# Patient Record
Sex: Female | Born: 1975
Health system: Southern US, Community
[De-identification: ages and names within clinical notes are randomized; demographics above are authoritative.]

## PROBLEM LIST (undated history)

## (undated) DIAGNOSIS — R0600 Dyspnea, unspecified: Secondary | ICD-10-CM

## (undated) DIAGNOSIS — J439 Emphysema, unspecified: Secondary | ICD-10-CM

## (undated) DIAGNOSIS — E039 Hypothyroidism, unspecified: Secondary | ICD-10-CM

## (undated) DIAGNOSIS — M419 Scoliosis, unspecified: Secondary | ICD-10-CM

## (undated) DIAGNOSIS — Z148 Genetic carrier of other disease: Secondary | ICD-10-CM

## (undated) HISTORY — DX: Hypothyroidism, unspecified: E03.9

## (undated) HISTORY — PX: BACK SURGERY: SHX140

## (undated) HISTORY — DX: Emphysema, unspecified: J43.9

---

## 2015-01-07 ENCOUNTER — Encounter (HOSPITAL_COMMUNITY): Payer: Self-pay

## 2015-01-07 ENCOUNTER — Emergency Department (HOSPITAL_COMMUNITY)
Admission: EM | Admit: 2015-01-07 | Discharge: 2015-01-07 | Disposition: A | Discharge status: Home / Self Care | Payer: BLUE CROSS/BLUE SHIELD | Attending: Emergency Medicine | Admitting: Emergency Medicine

## 2015-01-07 ENCOUNTER — Emergency Department (HOSPITAL_COMMUNITY): Payer: BLUE CROSS/BLUE SHIELD

## 2015-01-07 DIAGNOSIS — S322XXA Fracture of coccyx, initial encounter for closed fracture: Secondary | ICD-10-CM | POA: Diagnosis not present

## 2015-01-07 DIAGNOSIS — W06XXXA Fall from bed, initial encounter: Secondary | ICD-10-CM | POA: Diagnosis not present

## 2015-01-07 DIAGNOSIS — Y998 Other external cause status: Secondary | ICD-10-CM | POA: Diagnosis not present

## 2015-01-07 DIAGNOSIS — Y9289 Other specified places as the place of occurrence of the external cause: Secondary | ICD-10-CM | POA: Insufficient documentation

## 2015-01-07 DIAGNOSIS — Z720 Tobacco use: Secondary | ICD-10-CM | POA: Diagnosis not present

## 2015-01-07 DIAGNOSIS — S3210XA Unspecified fracture of sacrum, initial encounter for closed fracture: Secondary | ICD-10-CM

## 2015-01-07 DIAGNOSIS — Z8739 Personal history of other diseases of the musculoskeletal system and connective tissue: Secondary | ICD-10-CM | POA: Insufficient documentation

## 2015-01-07 DIAGNOSIS — S3219XA Other fracture of sacrum, initial encounter for closed fracture: Secondary | ICD-10-CM | POA: Insufficient documentation

## 2015-01-07 DIAGNOSIS — Y9384 Activity, sleeping: Secondary | ICD-10-CM | POA: Insufficient documentation

## 2015-01-07 DIAGNOSIS — S3992XA Unspecified injury of lower back, initial encounter: Secondary | ICD-10-CM | POA: Diagnosis present

## 2015-01-07 HISTORY — DX: Scoliosis, unspecified: M41.9

## 2015-01-07 MED ORDER — METHOCARBAMOL 500 MG PO TABS: 500.0000 mg | ORAL_TABLET | Freq: Two times a day (BID) | ORAL | Status: DC

## 2015-01-07 MED ORDER — OXYCODONE-ACETAMINOPHEN 5-325 MG PO TABS
1.0000 | ORAL_TABLET | Freq: Once | ORAL | Status: AC
  Administered 2015-01-07: 1 via ORAL
  Filled 2015-01-07: qty 1

## 2015-01-07 MED ORDER — OXYCODONE-ACETAMINOPHEN 5-325 MG PO TABS: 1.0000 | ORAL_TABLET | Freq: Four times a day (QID) | ORAL | Status: DC | PRN

## 2015-01-07 MED ORDER — NAPROXEN 500 MG PO TABS: 500.0000 mg | ORAL_TABLET | Freq: Two times a day (BID) | ORAL | Status: DC

## 2015-01-07 NOTE — ED Notes (Signed)
PA at the bedside.

## 2015-01-07 NOTE — ED Notes (Signed)
Pt. Reports slipping on tile floor 3-4 days ago and landed on buttocks. Reports pain to tailbone. CNS intact distally.

## 2015-01-07 NOTE — ED Notes (Signed)
Patient in Xray at this time.

## 2015-01-07 NOTE — ED Provider Notes (Signed)
CSN: [id number]     Arrival date & time 01/07/15  1616 History  This chart was scribed for Noland Fordyce, PA-C, working with [employer]. Alvino Chapel, MD by Steva Colder, ED Scribe. The patient was seen in room TR04C/TR04C at 6:28 PM.    Chief Complaint  Patient presents with  . Fall  . Tailbone Pain      The history is provided by the patient. No language interpreter was used.    HPI Comments: Elaine Gibson is a 39 y.o. female with a medical hx of scoliosis who presents to the Emergency Department complaining of fall onset 3-4 days ago. Pt notes that she slipped on the floor because she fell out of the bed while sleeping and landed on her bottom.  She states that she is having associated symptoms of tailbone pain. She states that she has tried tylenol with no relief for her symptoms. She denies bruising numbness/tingling, bowel/bladder issues, and any other symptoms. Pt has had back surgery for scoliosis. Denies having an orthopedist. Pt is a Scientist, water quality.    Past Medical History  Diagnosis Date  . Scoliosis    Past Surgical History  Procedure Laterality Date  . Cesarean section     No family history on file. History  Substance Use Topics  . Smoking status: Current Every Day Smoker    Types: Cigarettes  . Smokeless tobacco: Not on file  . Alcohol Use: No   OB History    No data available     Review of Systems  Genitourinary: Negative for dysuria, urgency and hematuria.  Musculoskeletal: Positive for arthralgias. Negative for myalgias.  Skin: Negative for color change and wound.  Neurological: Negative for numbness.      Allergies  Review of patient's allergies indicates no known allergies.  Home Medications   Prior to Admission medications   Medication Sig Start Date End Date Taking? Authorizing Provider  methocarbamol (ROBAXIN) 500 MG tablet Take 1 tablet (500 mg total) by mouth 2 (two) times daily. 01/07/15   Noland Fordyce, PA-C  naproxen (NAPROSYN) 500 MG tablet Take 1  tablet (500 mg total) by mouth 2 (two) times daily. 01/07/15   Noland Fordyce, PA-C  oxyCODONE-acetaminophen (PERCOCET/ROXICET) 5-325 MG per tablet Take 1-2 tablets by mouth every 6 (six) hours as needed for severe pain. 01/07/15   Noland Fordyce, PA-C   BP 110/79 mmHg  Pulse 100  Temp(Src) 98.7 F (37.1 C)  Resp 16  SpO2 99%  LMP 01/05/2015  Physical Exam  Constitutional: She is oriented to person, place, and time. She appears well-developed and well-nourished.  Sitting uncomfortably in exam chair leaning towards right side.  HENT:  Head: Normocephalic and atraumatic.  Eyes: EOM are normal.  Neck: Normal range of motion.  Cardiovascular: Normal rate.   Pulmonary/Chest: Effort normal.  Musculoskeletal: Normal range of motion.       Lumbar back: She exhibits no tenderness.  Tender along the sacrum and middle of buttocks.  No midline lumbar tenderness.   Neurological: She is alert and oriented to person, place, and time. No sensory deficit.  Antalgic gait. Sensation to large extremities intact.   Skin: Skin is warm and dry.  Psychiatric: She has a normal mood and affect. Her behavior is normal.  Nursing note and vitals reviewed.   ED Course  Procedures (including critical care time) DIAGNOSTIC STUDIES: Oxygen Saturation is 99% on room air, normal by my interpretation.    COORDINATION OF CARE: 6:31 PM-Discussed treatment plan which includes percocet, X-ray  of sacrum/coccyx, f/u and referral to orthopedist in 1-2 weeks, f/u if the symptoms worsen with pt at bedside and pt agreed to plan.   Labs Review Labs Reviewed - No data to display  Imaging Review Dg Sacrum/coccyx  01/07/2015   CLINICAL DATA:  Status post fall 2 days ago. Sacral pain. Unable to sit flat.  EXAM: SACRUM AND COCCYX - 2+ VIEW  COMPARISON:  None.  FINDINGS: There is a transverse nondisplaced mildly comminuted fracture of the S5 vertebral body segment. There is no other fracture or subluxation.  IMPRESSION: Transverse  nondisplaced mildly comminuted fracture of the S5 vertebral body segment.   Electronically Signed   By: Kathreen Devoid   On: 01/07/2015 17:01     EKG Interpretation None      MDM   Final diagnoses:  Fall from bed, initial encounter  Sacrum and coccyx fracture, closed, initial encounter    Pt presenting to ED with buttock pain. Plain films significant for transverse nondisplaced mildly comminuted fracture of the S5 vertebral body segment. Reviewed imaging with Dr. Alvino Chapel, advised pt f/u with orthopedics, tx symptomatically for pain.  No emergent intervention indicated at this time.   I personally performed the services described in this documentation, which was scribed in my presence. The recorded information has been reviewed and is accurate.    Noland Fordyce, PA-C 01/07/15 [place] Alvino Chapel, MD 01/08/15 [phone]

## 2015-01-07 NOTE — ED Notes (Signed)
Patient placed in room. Able to ambulate.

## 2016-11-04 ENCOUNTER — Emergency Department (HOSPITAL_COMMUNITY)
Admission: EM | Admit: 2016-11-04 | Discharge: 2016-11-04 | Disposition: A | Discharge status: Home / Self Care | Payer: Managed Care, Other (non HMO) | Attending: Emergency Medicine | Admitting: Emergency Medicine

## 2016-11-04 DIAGNOSIS — F17210 Nicotine dependence, cigarettes, uncomplicated: Secondary | ICD-10-CM | POA: Diagnosis not present

## 2016-11-04 DIAGNOSIS — K047 Periapical abscess without sinus: Secondary | ICD-10-CM

## 2016-11-04 DIAGNOSIS — Z79899 Other long term (current) drug therapy: Secondary | ICD-10-CM | POA: Diagnosis not present

## 2016-11-04 DIAGNOSIS — K0889 Other specified disorders of teeth and supporting structures: Secondary | ICD-10-CM | POA: Diagnosis present

## 2016-11-04 DIAGNOSIS — K029 Dental caries, unspecified: Secondary | ICD-10-CM | POA: Diagnosis not present

## 2016-11-04 MED ORDER — HYDROCODONE-ACETAMINOPHEN 5-325 MG PO TABS
1.0000 | ORAL_TABLET | Freq: Once | ORAL | Status: AC
  Administered 2016-11-04: 1 via ORAL
  Filled 2016-11-04: qty 1

## 2016-11-04 MED ORDER — AMOXICILLIN 500 MG PO CAPS: 500.0000 mg | ORAL_CAPSULE | Freq: Three times a day (TID) | ORAL | 0 refills | Status: DC

## 2016-11-04 MED ORDER — AMOXICILLIN 500 MG PO CAPS
500.0000 mg | ORAL_CAPSULE | Freq: Once | ORAL | Status: AC
  Administered 2016-11-04: 500 mg via ORAL
  Filled 2016-11-04: qty 1

## 2016-11-04 MED ORDER — TRAMADOL HCL 50 MG PO TABS: 50.0000 mg | ORAL_TABLET | Freq: Four times a day (QID) | ORAL | 0 refills | Status: DC | PRN

## 2016-11-04 NOTE — ED Provider Notes (Signed)
[facility] DEPT Provider Note   CSN: [id number] Arrival date & time: 11/04/16  2015  By signing my name below, I, Jeanell Sparrow, attest that this documentation has been prepared under the direction and in the presence of non-physician practitioner, Debroah Baller, NP. Electronically Signed: Jeanell Sparrow, Scribe. 11/04/2016. 10:52 PM.  History   Chief Complaint Chief Complaint  Patient presents with  . Dental Pain   The history is provided by the patient. No language interpreter was used.  Dental Pain   This is a new problem. The current episode started yesterday. The problem occurs constantly. The problem has been gradually worsening. The pain is moderate. She has tried acetaminophen for the symptoms. The treatment provided no relief.   HPI Comments: Elaine Gibson is a 40 y.o. female who presents to the Emergency Department complaining of constant moderate left-sided dental pain that started yesterday. She describes the pain as unrelieved by aleve and exacerbated by eating. She reports associated left-sided headache. She denies any prior hx of similar complaint, current dentist, nausea, vomiting, chills, or other complaints.   Past Medical History:  Diagnosis Date  . Scoliosis     There are no active problems to display for this patient.   Past Surgical History:  Procedure Laterality Date  . CESAREAN SECTION      OB History    No data available       Home Medications    Prior to Admission medications   Medication Sig Start Date End Date Taking? Authorizing Provider  amoxicillin (AMOXIL) 500 MG capsule Take 1 capsule (500 mg total) by mouth 3 (three) times daily. 11/04/16   Hope Bunnie Pion, NP  methocarbamol (ROBAXIN) 500 MG tablet Take 1 tablet (500 mg total) by mouth 2 (two) times daily. 01/07/15   Noland Fordyce, PA-C  naproxen (NAPROSYN) 500 MG tablet Take 1 tablet (500 mg total) by mouth 2 (two) times daily. 01/07/15   Noland Fordyce, PA-C  oxyCODONE-acetaminophen  (PERCOCET/ROXICET) 5-325 MG per tablet Take 1-2 tablets by mouth every 6 (six) hours as needed for severe pain. 01/07/15   Noland Fordyce, PA-C  traMADol (ULTRAM) 50 MG tablet Take 1 tablet (50 mg total) by mouth every 6 (six) hours as needed. 11/04/16   Hope Bunnie Pion, NP    Family History No family history on file.  Social History Social History  Substance Use Topics  . Smoking status: Current Every Day Smoker    Types: Cigarettes  . Smokeless tobacco: Not on file  . Alcohol use No     Allergies   Patient has no known allergies.   Review of Systems Review of Systems  Constitutional: Negative for chills and fever.  HENT: Positive for dental problem and ear pain (left). Negative for ear discharge and sore throat.   Respiratory: Negative for cough.   Gastrointestinal: Negative for nausea and vomiting.  Skin: Negative for wound.  Neurological: Positive for headaches (Left-sided).  Psychiatric/Behavioral: Negative for confusion.     Physical Exam Updated Vital Signs BP 93/65   Pulse 90   Temp 98 F (36.7 C)   Resp 14   LMP 09/18/2016 (Approximate)   SpO2 97%   Physical Exam  Constitutional: She appears well-developed and well-nourished. No distress.  HENT:  Head: Normocephalic.  Mouth/Throat: Oropharynx is clear and moist. Abnormal dentition. Dental caries present.  Lower left first molar is broken. Surrounding gum is TTP.   Eyes: Conjunctivae are normal.  Neck: Neck supple.  Cardiovascular: Normal rate and regular rhythm.  Pulmonary/Chest: Effort normal. No respiratory distress. She has no wheezes. She has no rales.  Abdominal: Soft. There is no tenderness.  Musculoskeletal: Normal range of motion.  Lymphadenopathy:    She has cervical adenopathy (Left-sided).  Neurological: She is alert.  Skin: Skin is warm and dry.  Psychiatric: She has a normal mood and affect. Her behavior is normal.  Nursing note and vitals reviewed.    ED Treatments / Results    DIAGNOSTIC STUDIES: Oxygen Saturation is 97% on RA, normal by my interpretation.    COORDINATION OF CARE: 10:55 PM- Pt advised of plan for treatment and pt agrees.  Labs (all labs ordered are listed, but only abnormal results are displayed) Labs Reviewed - No data to display   Radiology No results found.  Procedures Procedures (including critical care time)  Medications Ordered in ED Medications  HYDROcodone-acetaminophen (NORCO/VICODIN) 5-325 MG per tablet 1 tablet (1 tablet Oral Given 11/04/16 2322)  amoxicillin (AMOXIL) capsule 500 mg (500 mg Oral Given 11/04/16 2322)     Initial Impression / Assessment and Plan / ED Course  I have reviewed the triage vital signs and the nursing notes.   Clinical Course   Patient with dentalgia.  No abscess requiring immediate incision and drainage.  Exam not concerning for Ludwig's angina or pharyngeal abscess.  Will treat with Amoxicillin and Tramadol. Pt instructed to follow-up with dentist. Referral given. Discussed return precautions. Pt appears safe for discharge.   Final Clinical Impressions(s) / ED Diagnoses   Final diagnoses:  Infected dental caries    New Prescriptions Discharge Medication List as of 11/04/2016 11:01 PM    START taking these medications   Details  amoxicillin (AMOXIL) 500 MG capsule Take 1 capsule (500 mg total) by mouth 3 (three) times daily., Starting Fri 11/04/2016, Print    traMADol (ULTRAM) 50 MG tablet Take 1 tablet (50 mg total) by mouth every 6 (six) hours as needed., Starting Fri 11/04/2016, Print       I personally performed the services described in this documentation, which was scribed in my presence. The recorded information has been reviewed and is accurate.     [place], NP 11/05/16 3729    Fatima Blank, MD 11/05/16 [phone]

## 2016-11-04 NOTE — ED Notes (Signed)
Pt stable, ambulatory, states understanding of discharge instructions 

## 2016-11-04 NOTE — Discharge Instructions (Signed)
Continue to take the Aleve with the medications we give you. Do not drive while taking the narcotic as it will make you sleepy. Call Dr. Theodosia Blender office for follow up.

## 2016-11-04 NOTE — ED Triage Notes (Signed)
Pt complaining L side tooth pain. Pt complaining of swelling to jaw. Pt states broken teeth.

## 2017-09-13 ENCOUNTER — Other Ambulatory Visit: Payer: Self-pay

## 2017-09-13 DIAGNOSIS — M7022 Olecranon bursitis, left elbow: Secondary | ICD-10-CM | POA: Insufficient documentation

## 2017-09-13 DIAGNOSIS — Y9389 Activity, other specified: Secondary | ICD-10-CM | POA: Insufficient documentation

## 2017-09-13 DIAGNOSIS — Z79899 Other long term (current) drug therapy: Secondary | ICD-10-CM | POA: Insufficient documentation

## 2017-09-13 DIAGNOSIS — F17210 Nicotine dependence, cigarettes, uncomplicated: Secondary | ICD-10-CM | POA: Insufficient documentation

## 2017-09-13 DIAGNOSIS — X500XXA Overexertion from strenuous movement or load, initial encounter: Secondary | ICD-10-CM | POA: Insufficient documentation

## 2017-09-13 NOTE — ED Triage Notes (Signed)
Pt presents with a painful lump on her elbow. States that It has gradually increased in size but has started hurting worse over the past 2-3 days. Pt denies any drainage from site.

## 2017-09-14 ENCOUNTER — Emergency Department (HOSPITAL_COMMUNITY)
Admission: EM | Admit: 2017-09-14 | Discharge: 2017-09-14 | Disposition: A | Discharge status: Home / Self Care | Payer: BLUE CROSS/BLUE SHIELD | Attending: Emergency Medicine | Admitting: Emergency Medicine

## 2017-09-14 DIAGNOSIS — M7022 Olecranon bursitis, left elbow: Secondary | ICD-10-CM

## 2017-09-14 MED ORDER — NAPROXEN 500 MG PO TABS: 500.0000 mg | ORAL_TABLET | Freq: Two times a day (BID) | ORAL | 0 refills | Status: DC

## 2017-09-14 NOTE — Discharge Instructions (Signed)
Take the prescribed medication as directed. Recommend to wear elbow sleeve or ace wrap to help with swelling.  Can also ice elbow. Follow-up with orthopedics if you have ongoing/worsening symptoms. Return to the ED for new or worsening symptoms.

## 2017-09-14 NOTE — ED Provider Notes (Signed)
[facility] EMERGENCY DEPARTMENT Provider Note   CSN: [id number] Arrival date & time: 09/13/17  2225     History   Chief Complaint Chief Complaint  Patient presents with  . Abscess    HPI Elaine Gibson is a 41 y.o. female.  The history is provided by the patient and medical records.  Abscess    41 year old female with history of scoliosis, presenting to the ED with left elbow swelling.  Reports her elbow has been swollen for several weeks now, almost a month.  Reports of the past 2-3 days she has had some discomfort.  She reports her sister look at it was concerned it was an abscess so encouraged her to be seen today.  She has not had any trauma or falls to the elbow.  No fever or chills.  No drainage.   States she works 2 jobs, both of which are very physical.  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  States she has not tried any medications for her symptoms.  Past Medical History:  Diagnosis Date  . Scoliosis     There are no active problems to display for this patient.   Past Surgical History:  Procedure Laterality Date  . CESAREAN SECTION      OB History    No data available       Home Medications    Prior to Admission medications   Medication Sig Start Date End Date Taking? Authorizing Provider  amoxicillin (AMOXIL) 500 MG capsule Take 1 capsule (500 mg total) by mouth 3 (three) times daily. 11/04/16   Ashley Murrain, NP  methocarbamol (ROBAXIN) 500 MG tablet Take 1 tablet (500 mg total) by mouth 2 (two) times daily. 01/07/15   Noe Gens, PA-C  naproxen (NAPROSYN) 500 MG tablet Take 1 tablet (500 mg total) by mouth 2 (two) times daily. 01/07/15   Noe Gens, PA-C  oxyCODONE-acetaminophen (PERCOCET/ROXICET) 5-325 MG per tablet Take 1-2 tablets by mouth every 6 (six) hours as needed for severe pain. 01/07/15   Noe Gens, PA-C  traMADol (ULTRAM) 50 MG tablet Take 1  tablet (50 mg total) by mouth every 6 (six) hours as needed. 11/04/16   Ashley Murrain, NP    Family History No family history on file.  Social History Social History   Tobacco Use  . Smoking status: Current Every Day Smoker    Types: Cigarettes  Substance Use Topics  . Alcohol use: No  . Drug use: Not on file     Allergies   Patient has no known allergies.   Review of Systems Review of Systems  Musculoskeletal: Positive for joint swelling.  All other systems reviewed and are negative.    Physical Exam Updated Vital Signs BP 121/84   Pulse 79   Temp 98.6 F (37 C) (Oral)   Resp 16   Ht 5\' 6"  (1.676 m)   Wt 49.9 kg (110 lb)   SpO2 92%   BMI 17.75 kg/m   Physical Exam  Constitutional: She is oriented to person, place, and time. She appears well-developed and well-nourished.  HENT:  Head: Normocephalic and atraumatic.  Mouth/Throat: Oropharynx is clear and moist.  Eyes: Conjunctivae and EOM are normal. Pupils are equal, round, and reactive to light.  Neck: Normal range of motion.  Cardiovascular: Normal rate, regular rhythm and normal heart sounds.  Pulmonary/Chest: Effort normal  and breath sounds normal.  Abdominal: Soft. Bowel sounds are normal.  Musculoskeletal: Normal range of motion.  Swelling locally over the left olecranon bursa; there is no overlying erythema, induration, or warmth to touch; no lymphangitis of the arm; no abscess or drainage noted; no bleeding or open wounds; full ROM of the elbow without noted pain; normal grip strengths  Neurological: She is alert and oriented to person, place, and time.  Skin: Skin is warm and dry.  Psychiatric: She has a normal mood and affect.  Nursing note and vitals reviewed.    ED Treatments / Results  Labs (all labs ordered are listed, but only abnormal results are displayed) Labs Reviewed - No data to display  EKG  EKG Interpretation None       Radiology No results  found.  Procedures Procedures (including critical care time)  Medications Ordered in ED Medications - No data to display   Initial Impression / Assessment and Plan / ED Course  I have reviewed the triage vital signs and the nursing notes.  Pertinent labs & imaging results that were available during my care of the patient were reviewed by me and considered in my medical decision making (see chart for details).  41 year old female here with left elbow swelling for a few weeks.  Symptoms and exam findings are consistent with olecranon bursitis.  There is no overlying erythema, warmth to touch, or other overlying skin changes.  No lymphangitis.  No pain with range of motion of the elbow.  I do not suspect septic bursitis or septic joint at this time.  We have discussed supportive measures with anti-inflammatories, elbow sleeve, ice.  Will refer to orthopedics if any ongoing issues.  Work note given.  Discussed plan with patient, she acknowledged understanding and agreed with plan of care.  Return precautions given for new or worsening symptoms.  Final Clinical Impressions(s) / ED Diagnoses   Final diagnoses:  Olecranon bursitis of left elbow    ED Discharge Orders        Ordered    naproxen (NAPROSYN) 500 MG tablet  2 times daily with meals     09/14/17 0410       Larene Pickett, PA-C 09/14/17 [place], Delice Bison, DO 09/14/17 7943

## 2017-09-14 NOTE — ED Notes (Signed)
Patient is A&Ox4.  No signs of distress noted.  Please see providers complete history and physical exam.  

## 2017-11-01 ENCOUNTER — Other Ambulatory Visit: Payer: Self-pay

## 2017-11-01 ENCOUNTER — Emergency Department (HOSPITAL_COMMUNITY)
Admission: EM | Admit: 2017-11-01 | Discharge: 2017-11-01 | Disposition: A | Discharge status: Home / Self Care | Payer: BLUE CROSS/BLUE SHIELD | Attending: Emergency Medicine | Admitting: Emergency Medicine

## 2017-11-01 ENCOUNTER — Encounter (HOSPITAL_COMMUNITY): Payer: Self-pay | Admitting: Emergency Medicine

## 2017-11-01 ENCOUNTER — Emergency Department (HOSPITAL_COMMUNITY): Payer: BLUE CROSS/BLUE SHIELD

## 2017-11-01 DIAGNOSIS — Z79899 Other long term (current) drug therapy: Secondary | ICD-10-CM | POA: Insufficient documentation

## 2017-11-01 DIAGNOSIS — J069 Acute upper respiratory infection, unspecified: Secondary | ICD-10-CM

## 2017-11-01 DIAGNOSIS — Z7982 Long term (current) use of aspirin: Secondary | ICD-10-CM | POA: Insufficient documentation

## 2017-11-01 DIAGNOSIS — M7918 Myalgia, other site: Secondary | ICD-10-CM | POA: Insufficient documentation

## 2017-11-01 DIAGNOSIS — R0602 Shortness of breath: Secondary | ICD-10-CM | POA: Insufficient documentation

## 2017-11-01 DIAGNOSIS — F17210 Nicotine dependence, cigarettes, uncomplicated: Secondary | ICD-10-CM | POA: Insufficient documentation

## 2017-11-01 DIAGNOSIS — R5383 Other fatigue: Secondary | ICD-10-CM | POA: Insufficient documentation

## 2017-11-01 LAB — URINALYSIS, ROUTINE W REFLEX MICROSCOPIC
BILIRUBIN URINE: NEGATIVE
GLUCOSE, UA: NEGATIVE mg/dL
Ketones, ur: NEGATIVE mg/dL
LEUKOCYTES UA: NEGATIVE
Nitrite: NEGATIVE
PROTEIN: NEGATIVE mg/dL
Specific Gravity, Urine: 1.013 (ref 1.005–1.030)
pH: 5 (ref 5.0–8.0)

## 2017-11-01 LAB — COMPREHENSIVE METABOLIC PANEL
ALT: 20 U/L (ref 14–54)
AST: 31 U/L (ref 15–41)
Albumin: 3.9 g/dL (ref 3.5–5.0)
Alkaline Phosphatase: 66 U/L (ref 38–126)
Anion gap: 12 (ref 5–15)
BUN: 9 mg/dL (ref 6–20)
CHLORIDE: 97 mmol/L — AB (ref 101–111)
CO2: 23 mmol/L (ref 22–32)
Calcium: 9.2 mg/dL (ref 8.9–10.3)
Creatinine, Ser: 1.04 mg/dL — ABNORMAL HIGH (ref 0.44–1.00)
GFR calc Af Amer: >60 mL/min (ref >60)
Glucose, Bld: 166 mg/dL — ABNORMAL HIGH (ref 65–99)
POTASSIUM: 4.3 mmol/L (ref 3.5–5.1)
Sodium: 132 mmol/L — ABNORMAL LOW (ref 135–145)
Total Bilirubin: 0.4 mg/dL (ref 0.3–1.2)
Total Protein: 7.8 g/dL (ref 6.5–8.1)

## 2017-11-01 LAB — CBC WITH DIFFERENTIAL/PLATELET
Basophils Absolute: 0 10*3/uL (ref 0.0–0.1)
Basophils Relative: 1 %
Eosinophils Absolute: 0.2 10*3/uL (ref 0.0–0.7)
Eosinophils Relative: 2 %
HCT: 41.9 % (ref 36.0–46.0)
Hemoglobin: 13.6 g/dL (ref 12.0–15.0)
LYMPHS ABS: 2.8 10*3/uL (ref 0.7–4.0)
LYMPHS PCT: 38 %
MCH: 27.5 pg (ref 26.0–34.0)
MCHC: 32.5 g/dL (ref 30.0–36.0)
MCV: 84.6 fL (ref 78.0–100.0)
Monocytes Absolute: 0.7 10*3/uL (ref 0.1–1.0)
Monocytes Relative: 9 %
Neutro Abs: 3.7 10*3/uL (ref 1.7–7.7)
Neutrophils Relative %: 50 %
Platelets: 214 10*3/uL (ref 150–400)
RBC: 4.95 MIL/uL (ref 3.87–5.11)
RDW: 12.4 % (ref 11.5–15.5)
WBC: 7.4 10*3/uL (ref 4.0–10.5)

## 2017-11-01 LAB — I-STAT BETA HCG BLOOD, ED (MC, WL, AP ONLY): I-stat hCG, quantitative: <5 m[IU]/mL (ref <5)

## 2017-11-01 LAB — I-STAT CG4 LACTIC ACID, ED
Lactic Acid, Venous: 2.43 mmol/L (ref 0.5–1.9)
Lactic Acid, Venous: 0.7 mmol/L (ref 0.5–1.9)

## 2017-11-01 MED ORDER — ACETAMINOPHEN 325 MG PO TABS
650.0000 mg | ORAL_TABLET | Freq: Once | ORAL | Status: AC
Start: 2017-11-01 — End: 2017-11-01
  Administered 2017-11-01: 650 mg via ORAL
  Filled 2017-11-01: qty 2

## 2017-11-01 MED ORDER — ALBUTEROL SULFATE HFA 108 (90 BASE) MCG/ACT IN AERS
1.0000 | INHALATION_SPRAY | Freq: Once | RESPIRATORY_TRACT | Status: AC
  Administered 2017-11-01: 1 via RESPIRATORY_TRACT
  Filled 2017-11-01: qty 6.7

## 2017-11-01 MED ORDER — SODIUM CHLORIDE 0.9 % IV BOLUS (SEPSIS)
500.0000 mL | Freq: Once | INTRAVENOUS | Status: AC
  Administered 2017-11-01: 500 mL via INTRAVENOUS

## 2017-11-01 MED ORDER — SODIUM CHLORIDE 0.9 % IV BOLUS (SEPSIS)
1000.0000 mL | Freq: Once | INTRAVENOUS | Status: AC
  Administered 2017-11-01: 1000 mL via INTRAVENOUS

## 2017-11-01 MED ORDER — AEROCHAMBER PLUS FLO-VU SMALL MISC
1.0000 | Freq: Once | Status: AC
  Administered 2017-11-01: 1
  Filled 2017-11-01: qty 1

## 2017-11-01 MED ORDER — BENZONATATE 100 MG PO CAPS: 100.0000 mg | ORAL_CAPSULE | Freq: Three times a day (TID) | ORAL | 0 refills | Status: DC

## 2017-11-01 NOTE — ED Triage Notes (Signed)
Pt states cough with body aches since Christmas eve. Pt BP noted to be low at triage at 97 systolic, pt is 423 lbs. Pt has documented BP from prior visit of 953U sytolic. HR 126. Pt states "I dont know how to cough my mucous up."

## 2017-11-01 NOTE — ED Provider Notes (Addendum)
Patient with chronic cough which became worse 2 days ago.  She denies any fever.  She states her breathing is presently normal.  And feels much improved since treatment here.  She denies any vomiting.  No other associated symptoms on exam thin female alert and in no distress, speaks in paragraphs.  Lungs clear to auscultation heart regular rate and rhythm pulse counted at 92 bpm by me.  She is not lightheaded on standing.  Chest x-ray reviewed by me.  I counseled patient for 5 minutes on smoking cessation   Orlie Dakin, MD 11/01/17 1315    Orlie Dakin, MD 11/01/17 1315

## 2017-11-01 NOTE — ED Notes (Signed)
Pt ambulated to lobby after d/c. Inhaler and chamber as well as instructions for use were given PTD.

## 2017-11-01 NOTE — ED Provider Notes (Signed)
[facility] EMERGENCY DEPARTMENT Provider Note   CSN: [id number] Arrival date & time: 11/01/17  0909     History   Chief Complaint Chief Complaint  Patient presents with  . Cough    HPI Elaine Gibson is a 41 y.o. female.  HPI   41 year old female with a history of emphysema presents today with points of upper respiratory infection.  Patient reports that 2 days ago she developed fatigue, cough and body aches.  Patient notes symptoms have persisted with productive cough.  She denies any chest pain, reports some shortness of breath.  She denies any lower extremity swelling or edema.  Patient reports she is a smoker, other than emphysema denies any significant past medical history.  Patient denies any fever, nausea or vomiting.  She denies any close sick contacts.   Past Medical History:  Diagnosis Date  . Scoliosis     There are no active problems to display for this patient.   Past Surgical History:  Procedure Laterality Date  . CESAREAN SECTION      OB History    No data available       Home Medications    Prior to Admission medications   Medication Sig Start Date End Date Taking? Authorizing Provider  aspirin-sod bicarb-citric acid (ALKA-SELTZER) 325 MG TBEF tablet Take 325 mg by mouth every 6 (six) hours as needed (cold symptoms).   Yes [provider]  DM-Doxylamine-Acetaminophen (NYQUIL COLD & FLU PO) Take 1 capsule by mouth at bedtime as needed (clcol).   Yes [provider]  amoxicillin (AMOXIL) 500 MG capsule Take 1 capsule (500 mg total) by mouth 3 (three) times daily. Patient not taking: Reported on 11/01/2017 11/04/16   Ashley Murrain, NP  benzonatate (TESSALON) 100 MG capsule Take 1 capsule (100 mg total) by mouth every 8 (eight) hours. 11/01/17   Ivon Roedel, Dellis Filbert, PA-C  methocarbamol (ROBAXIN) 500 MG tablet Take 1 tablet (500 mg total) by mouth 2 (two) times daily. Patient not taking: Reported on 11/01/2017 01/07/15    Noe Gens, PA-C  naproxen (NAPROSYN) 500 MG tablet Take 1 tablet (500 mg total) by mouth 2 (two) times daily. Patient not taking: Reported on 11/01/2017 01/07/15   Noe Gens, PA-C  naproxen (NAPROSYN) 500 MG tablet Take 1 tablet (500 mg total) 2 (two) times daily with a meal by mouth. Patient not taking: Reported on 11/01/2017 09/14/17   Larene Pickett, PA-C  oxyCODONE-acetaminophen (PERCOCET/ROXICET) 5-325 MG per tablet Take 1-2 tablets by mouth every 6 (six) hours as needed for severe pain. Patient not taking: Reported on 11/01/2017 01/07/15   Noe Gens, PA-C  traMADol (ULTRAM) 50 MG tablet Take 1 tablet (50 mg total) by mouth every 6 (six) hours as needed. Patient not taking: Reported on 11/01/2017 11/04/16   Ashley Murrain, NP    Family History No family history on file.  Social History Social History   Tobacco Use  . Smoking status: Current Every Day Smoker    Types: Cigarettes  Substance Use Topics  . Alcohol use: No  . Drug use: Not on file     Allergies   Patient has no known allergies.   Review of Systems Review of Systems  All other systems reviewed and are negative.   Physical Exam Updated Vital Signs BP 115/76 (BP Location: Right Arm)   Pulse (!) 111   Temp 98.8 F (37.1 C) (Oral)   Resp (!) 26   LMP 11/01/2017  SpO2 95%   Physical Exam  Constitutional: She is oriented to person, place, and time. She appears well-developed and well-nourished.  HENT:  Head: Normocephalic and atraumatic.  Eyes: Conjunctivae are normal. Pupils are equal, round, and reactive to light. Right eye exhibits no discharge. Left eye exhibits no discharge. No scleral icterus.  Neck: Normal range of motion. No JVD present. No tracheal deviation present.  Pulmonary/Chest: Effort normal and breath sounds normal. No stridor. No respiratory distress. She has no wheezes. She has no rales. She exhibits no tenderness.  Productive cough  Musculoskeletal: Normal range of motion.  She exhibits no edema.  Neurological: She is alert and oriented to person, place, and time. Coordination normal.  Psychiatric: She has a normal mood and affect. Her behavior is normal. Judgment and thought content normal.  Nursing note and vitals reviewed.    ED Treatments / Results  Labs (all labs ordered are listed, but only abnormal results are displayed) Labs Reviewed  COMPREHENSIVE METABOLIC PANEL - Abnormal; Notable for the following components:      Result Value   Sodium 132 (*)    Chloride 97 (*)    Glucose, Bld 166 (*)    Creatinine, Ser 1.04 (*)    All other components within normal limits  URINALYSIS, ROUTINE W REFLEX MICROSCOPIC - Abnormal; Notable for the following components:   APPearance HAZY (*)    Hgb urine dipstick LARGE (*)    Bacteria, UA RARE (*)    Squamous Epithelial / LPF 0-5 (*)    All other components within normal limits  I-STAT CG4 LACTIC ACID, ED - Abnormal; Notable for the following components:   Lactic Acid, Venous 2.43 (*)    All other components within normal limits  CBC WITH DIFFERENTIAL/PLATELET  I-STAT BETA HCG BLOOD, ED ([facility], [facility], [facility] ONLY)  I-STAT CG4 LACTIC ACID, ED    EKG  EKG Interpretation None       Radiology Dg Chest 2 View  Result Date: 11/01/2017 CLINICAL DATA:  Cough, congestion EXAM: CHEST  2 VIEW COMPARISON:  None. FINDINGS: There is hyperinflation of the lungs compatible with COPD. Bullous changes in the right upper lobe. Heart and mediastinal contours are within normal limits. No focal opacities or effusions. No acute bony abnormality. Posterior spinal rods in the thoracic spine. IMPRESSION: Bullous emphysema.  No active disease. Electronically Signed   By: Rolm Baptise M.D.   On: 11/01/2017 10:03    Procedures Procedures (including critical care time)  Medications Ordered in ED Medications  acetaminophen (TYLENOL) tablet 650 mg (not administered)  albuterol (PROVENTIL HFA;VENTOLIN HFA) 108 (90 Base) MCG/ACT inhaler 1  puff (not administered)  AEROCHAMBER PLUS FLO-VU SMALL device MISC 1 each (not administered)  sodium chloride 0.9 % bolus 1,000 mL (0 mLs Intravenous Stopped 11/01/17 1245)  sodium chloride 0.9 % bolus 500 mL (0 mLs Intravenous Stopped 11/01/17 1246)     Initial Impression / Assessment and Plan / ED Course  I have reviewed the triage vital signs and the nursing notes.  Pertinent labs & imaging results that were available during my care of the patient were reviewed by me and considered in my medical decision making (see chart for details).    Final Clinical Impressions(s) / ED Diagnoses   Final diagnoses:  Viral upper respiratory tract infection     Labs: CBC, CMP, lactic acid, beta hCG  Imaging: Chest 2 view  Consults:  Therapeutics: NS  Discharge Meds:  albuterol  Assessment/Plan: 41 year old female presents today with  likely viral URI.  She is well-appearing in no acute distress.  She is initially slightly tachycardic but with minimal elevation in her lactic acid of 2.43.  Patient was given a liter of fluid which improved her lactate, and tachycardia.  Patient has clear lung sounds, negative chest x-ray.  She has no elevation in her white blood cell or fever here.  Patient denies any significant past medical history.  No signs of bacterial infection, patient will be discharged with symptomatic care instructions and strict return precautions.  She verbalized understanding and agreement to today's plan had no further questions or concerns.   ED Discharge Orders        Ordered    benzonatate (TESSALON) 100 MG capsule  Every 8 hours     11/01/17 1313       Okey Regal, PA-C 11/01/17 [place], MD 11/01/17 1757

## 2017-11-01 NOTE — Discharge Instructions (Signed)
Please read attached information. If you experience any new or worsening signs or symptoms please return to the emergency room for evaluation. Please follow-up with your primary care provider or specialist as discussed. Please use medication prescribed only as directed and discontinue taking if you have any concerning signs or symptoms.  Please see his resources attached to discharge instructions to establish care with primary care.  Please discuss smoking cessation options with your primary care provider.

## 2017-12-25 ENCOUNTER — Other Ambulatory Visit: Payer: Self-pay

## 2017-12-25 ENCOUNTER — Encounter (HOSPITAL_COMMUNITY): Payer: Self-pay

## 2017-12-25 DIAGNOSIS — Z5321 Procedure and treatment not carried out due to patient leaving prior to being seen by health care provider: Secondary | ICD-10-CM | POA: Insufficient documentation

## 2017-12-25 DIAGNOSIS — K0889 Other specified disorders of teeth and supporting structures: Secondary | ICD-10-CM | POA: Insufficient documentation

## 2017-12-25 NOTE — ED Triage Notes (Signed)
Patient complains of toothache to the right side for 2 days.  Ibuprofen ineffective in treating pain. A&Ox4 at this time.  No swelling or inability to swallow.

## 2017-12-26 ENCOUNTER — Emergency Department (HOSPITAL_COMMUNITY)
Admission: EM | Admit: 2017-12-26 | Discharge: 2017-12-26 | Disposition: A | Discharge status: Home / Self Care | Payer: BLUE CROSS/BLUE SHIELD | Attending: Emergency Medicine | Admitting: Emergency Medicine

## 2017-12-26 HISTORY — DX: Emphysema, unspecified: J43.9

## 2017-12-26 NOTE — ED Notes (Signed)
Called for room  No answer

## 2017-12-26 NOTE — ED Notes (Signed)
Called for room

## 2018-05-08 ENCOUNTER — Encounter (HOSPITAL_COMMUNITY): Payer: Self-pay | Admitting: *Deleted

## 2018-05-08 ENCOUNTER — Other Ambulatory Visit: Payer: Self-pay

## 2018-05-08 ENCOUNTER — Emergency Department (HOSPITAL_COMMUNITY)
Admission: EM | Admit: 2018-05-08 | Discharge: 2018-05-08 | Disposition: A | Discharge status: Home / Self Care | Payer: Self-pay | Attending: Emergency Medicine | Admitting: Emergency Medicine

## 2018-05-08 ENCOUNTER — Emergency Department (HOSPITAL_COMMUNITY): Payer: Self-pay

## 2018-05-08 DIAGNOSIS — Z7982 Long term (current) use of aspirin: Secondary | ICD-10-CM | POA: Insufficient documentation

## 2018-05-08 DIAGNOSIS — F17210 Nicotine dependence, cigarettes, uncomplicated: Secondary | ICD-10-CM | POA: Insufficient documentation

## 2018-05-08 DIAGNOSIS — Z79899 Other long term (current) drug therapy: Secondary | ICD-10-CM | POA: Insufficient documentation

## 2018-05-08 DIAGNOSIS — M545 Low back pain, unspecified: Secondary | ICD-10-CM

## 2018-05-08 DIAGNOSIS — R52 Pain, unspecified: Secondary | ICD-10-CM

## 2018-05-08 LAB — I-STAT BETA HCG BLOOD, ED (MC, WL, AP ONLY): I-stat hCG, quantitative: <5 m[IU]/mL (ref <5)

## 2018-05-08 MED ORDER — CYCLOBENZAPRINE HCL 5 MG PO TABS: 5.0000 mg | ORAL_TABLET | Freq: Two times a day (BID) | ORAL | 0 refills | Status: DC | PRN

## 2018-05-08 MED ORDER — OXYCODONE-ACETAMINOPHEN 5-325 MG PO TABS
1.0000 | ORAL_TABLET | Freq: Once | ORAL | Status: AC
  Administered 2018-05-08: 1 via ORAL
  Filled 2018-05-08: qty 1

## 2018-05-08 MED ORDER — NAPROXEN 500 MG PO TABS: 500.0000 mg | ORAL_TABLET | Freq: Two times a day (BID) | ORAL | 0 refills | Status: DC

## 2018-05-08 MED ORDER — KETOROLAC TROMETHAMINE 30 MG/ML IJ SOLN
30.0000 mg | Freq: Once | INTRAMUSCULAR | Status: AC
  Administered 2018-05-08: 30 mg via INTRAMUSCULAR
  Filled 2018-05-08: qty 1

## 2018-05-08 NOTE — ED Triage Notes (Signed)
Pt arrives ambulatory to triage with c/o injury at work. States she was bending over and several 100 lbs boxes fell on her back and head. She c/o pain in the left back, upper back, neck and head. No LOC, just feelings of lightheadedness. No meds PTA.

## 2018-05-08 NOTE — ED Notes (Signed)
Patient verbalizes discharge instructions and medications. No further medications at this time. VSS. Patient ambulatory.

## 2018-05-08 NOTE — ED Provider Notes (Signed)
[facility] EMERGENCY DEPARTMENT Provider Note   CSN: [id number] Arrival date & time: 05/08/18  0257     History   Chief Complaint Chief Complaint  Patient presents with  . Back Pain    HPI Elaine Gibson is a 42 y.o. female.  HPI  This is a 42 year old female with a history of scoliosis status post rod placement who presents with back pain.  Patient reports that she was unloading a truck at work when several very heavy boxes fell onto her back and neck.  She does report that she got hit in the head.  She did not lose consciousness.  This happened at 10 PM.  She did not continue to work.  She was sent home.  Since that time she reports that she has had "20 out of 10" pain.  She has not taken anything for her pain.  She denies numbness or tingling of the legs or hands.  She denies any bowel or bladder difficulties.  She is reporting neck pain and stiffness as well as thoracic and lumbar pain.  She reports left flank pain.  She denies any hematuria.  She has been ambulatory.  She denies any nausea, vomiting, abdominal pain.  Past Medical History:  Diagnosis Date  . Emphysema lung ([place])   . Scoliosis     There are no active problems to display for this patient.   Past Surgical History:  Procedure Laterality Date  . CESAREAN SECTION       OB History   None      Home Medications    Prior to Admission medications   Medication Sig Start Date End Date Taking? Authorizing Provider  amoxicillin (AMOXIL) 500 MG capsule Take 1 capsule (500 mg total) by mouth 3 (three) times daily. Patient not taking: Reported on 11/01/2017 11/04/16   Ashley Murrain, NP  aspirin-sod bicarb-citric acid (ALKA-SELTZER) 325 MG TBEF tablet Take 325 mg by mouth every 6 (six) hours as needed (cold symptoms).    [provider]  benzonatate (TESSALON) 100 MG capsule Take 1 capsule (100 mg total) by mouth every 8 (eight) hours. 11/01/17   Hedges, Dellis Filbert, PA-C  cyclobenzaprine  (FLEXERIL) 5 MG tablet Take 1 tablet (5 mg total) by mouth 2 (two) times daily as needed for muscle spasms. 05/08/18   Aja Bolander, Barbette Hair, MD  DM-Doxylamine-Acetaminophen (NYQUIL COLD & FLU PO) Take 1 capsule by mouth at bedtime as needed (clcol).    [provider]  methocarbamol (ROBAXIN) 500 MG tablet Take 1 tablet (500 mg total) by mouth 2 (two) times daily. Patient not taking: Reported on 11/01/2017 01/07/15   Noe Gens, PA-C  naproxen (NAPROSYN) 500 MG tablet Take 1 tablet (500 mg total) by mouth 2 (two) times daily. 05/08/18   Keyshawn Hellwig, Barbette Hair, MD  oxyCODONE-acetaminophen (PERCOCET/ROXICET) 5-325 MG per tablet Take 1-2 tablets by mouth every 6 (six) hours as needed for severe pain. Patient not taking: Reported on 11/01/2017 01/07/15   Noe Gens, PA-C  traMADol (ULTRAM) 50 MG tablet Take 1 tablet (50 mg total) by mouth every 6 (six) hours as needed. Patient not taking: Reported on 11/01/2017 11/04/16   Ashley Murrain, NP    Family History No family history on file.  Social History Social History   Tobacco Use  . Smoking status: Current Every Day Smoker    Types: Cigarettes  . Smokeless tobacco: Never Used  Substance Use Topics  . Alcohol use: No  . Drug use:  Not on file     Allergies   Patient has no known allergies.   Review of Systems Review of Systems  Constitutional: Negative for fever.  Respiratory: Negative for shortness of breath.   Cardiovascular: Negative for chest pain.  Gastrointestinal: Negative for abdominal pain, nausea and vomiting.  Genitourinary: Positive for flank pain. Negative for difficulty urinating, dysuria and hematuria.  Musculoskeletal: Positive for back pain and neck pain.  Neurological: Negative for weakness, numbness and headaches.  All other systems reviewed and are negative.    Physical Exam Updated Vital Signs BP 106/77   Pulse 88   Temp 98.3 F (36.8 C) (Oral)   Resp 16   LMP 04/29/2018 Comment: neg preg test  SpO2  93%   Physical Exam  Constitutional: She is oriented to person, place, and time. She appears well-developed and well-nourished.  thin  HENT:  Head: Normocephalic and atraumatic.  Eyes: Pupils are equal, round, and reactive to light.  Neck: Normal range of motion. Neck supple.  Tenderness palpation mid C-spine without step-off or deformity  Cardiovascular: Normal rate, regular rhythm and normal heart sounds.  Pulmonary/Chest: Effort normal and breath sounds normal. No respiratory distress. She has no wheezes.  Abdominal: Soft. Bowel sounds are normal. There is no tenderness.  Musculoskeletal:  A long vertical midline incision traversing the thoracic and upper lumbar spine, well-healed, tenderness palpation along the entire thoracic and lumbar spine, no step-offs or deformities noted  Neurological: She is alert and oriented to person, place, and time.  5 out of 5 strength bilateral lower extremities, normal patellar reflexes, no clonus  Skin: Skin is warm and dry.  No significant contusion over the flank or back  Psychiatric: She has a normal mood and affect.  Nursing note and vitals reviewed.    ED Treatments / Results  Labs (all labs ordered are listed, but only abnormal results are displayed) Labs Reviewed  URINALYSIS, ROUTINE W REFLEX MICROSCOPIC  I-STAT BETA HCG BLOOD, ED ([facility], [facility], [facility] ONLY)    EKG None  Radiology Dg Cervical Spine Complete  Result Date: 05/08/2018 CLINICAL DATA:  Work injury. Boxes fell on the back of the patient's head. EXAM: CERVICAL SPINE - COMPLETE 4+ VIEW COMPARISON:  None. FINDINGS: Cervical spinal alignment is normal. Vertebral body heights are maintained. There is lower cervical degenerative disc disease at the C4-C7 levels. There is multilevel facet hypertrophy. IMPRESSION: Normal cervical spine alignment. Electronically Signed   By: Ulyses Jarred M.D.   On: 05/08/2018 06:10   Dg Thoracic Spine 2 View  Result Date: 05/08/2018 CLINICAL DATA:   42 year old female with fall and back pain. EXAM: LUMBAR SPINE - COMPLETE 4+ VIEW; THORACIC SPINE 2 VIEWS COMPARISON:  Pelvic radiograph dated 01/07/2015 FINDINGS: There is no acute fracture or subluxation of the thoracic or lumbar spine. Thoracolumbar posterior Harrington rod and degenerative changes noted. The hardware is appear intact. There is mild dextroscoliosis of the lower thoracic spine. The bones are osteopenic. There is large bullous emphysema of the right upper lobe. Bilateral tubal ligation clips noted. IMPRESSION: 1. No acute/traumatic thoracic or lumbar spine pathology. 2. Thoracolumbar Harrington rod and mild scoliosis. 3. Severe bullous emphysema. Electronically Signed   By: Anner Crete M.D.   On: 05/08/2018 06:10   Dg Lumbar Spine Complete  Result Date: 05/08/2018 CLINICAL DATA:  42 year old female with fall and back pain. EXAM: LUMBAR SPINE - COMPLETE 4+ VIEW; THORACIC SPINE 2 VIEWS COMPARISON:  Pelvic radiograph dated 01/07/2015 FINDINGS: There is no acute fracture or  subluxation of the thoracic or lumbar spine. Thoracolumbar posterior Harrington rod and degenerative changes noted. The hardware is appear intact. There is mild dextroscoliosis of the lower thoracic spine. The bones are osteopenic. There is large bullous emphysema of the right upper lobe. Bilateral tubal ligation clips noted. IMPRESSION: 1. No acute/traumatic thoracic or lumbar spine pathology. 2. Thoracolumbar Harrington rod and mild scoliosis. 3. Severe bullous emphysema. Electronically Signed   By: Anner Crete M.D.   On: 05/08/2018 06:10    Procedures Procedures (including critical care time)  Medications Ordered in ED Medications  oxyCODONE-acetaminophen (PERCOCET/ROXICET) 5-325 MG per tablet 1 tablet (1 tablet Oral Given 05/08/18 0409)  ketorolac (TORADOL) 30 MG/ML injection 30 mg (30 mg Intramuscular Given 05/08/18 0409)     Initial Impression / Assessment and Plan / ED Course  I have reviewed the triage  vital signs and the nursing notes.  Pertinent labs & imaging results that were available during my care of the patient were reviewed by me and considered in my medical decision making (see chart for details).     Patient presents with back pain and left flank pain after being hit with boxes at work.  She has been ambulatory.  She is neurovascularly intact.  No signs or symptoms of cauda equina.  No external signs of trauma or overlying skin changes.  X-rays obtained and patient was given pain medication.  X-rays do not show any evidence of acute fracture.  Patient is ambulatory without difficulty.  We will discharge home with naproxen and muscle relaxers.  After history, exam, and medical workup I feel the patient has been appropriately medically screened and is safe for discharge home. Pertinent diagnoses were discussed with the patient. Patient was given return precautions.   Final Clinical Impressions(s) / ED Diagnoses   Final diagnoses:  Acute bilateral low back pain without sciatica    ED Discharge Orders        Ordered    naproxen (NAPROSYN) 500 MG tablet  2 times daily     05/08/18 0645    cyclobenzaprine (FLEXERIL) 5 MG tablet  2 times daily PRN     05/08/18 0645       Merryl Hacker, MD 05/08/18 [phone]

## 2018-05-08 NOTE — Discharge Instructions (Addendum)
Were seen today for back pain after an injury.  Your x-rays are negative for acute fracture.  Take medications as prescribed.  Avoid heavy lifting for the next 3 to 5 days until you improve.

## 2018-10-22 ENCOUNTER — Emergency Department (HOSPITAL_COMMUNITY)
Admission: EM | Admit: 2018-10-22 | Discharge: 2018-10-22 | Disposition: A | Discharge status: Home / Self Care | Payer: Self-pay | Attending: Emergency Medicine | Admitting: Emergency Medicine

## 2018-10-22 ENCOUNTER — Encounter (HOSPITAL_COMMUNITY): Payer: Self-pay

## 2018-10-22 ENCOUNTER — Other Ambulatory Visit: Payer: Self-pay

## 2018-10-22 DIAGNOSIS — Z79899 Other long term (current) drug therapy: Secondary | ICD-10-CM | POA: Insufficient documentation

## 2018-10-22 DIAGNOSIS — F17210 Nicotine dependence, cigarettes, uncomplicated: Secondary | ICD-10-CM | POA: Insufficient documentation

## 2018-10-22 DIAGNOSIS — Z7982 Long term (current) use of aspirin: Secondary | ICD-10-CM | POA: Insufficient documentation

## 2018-10-22 DIAGNOSIS — K047 Periapical abscess without sinus: Secondary | ICD-10-CM | POA: Insufficient documentation

## 2018-10-22 MED ORDER — CLINDAMYCIN HCL 150 MG PO CAPS
300.0000 mg | ORAL_CAPSULE | Freq: Once | ORAL | Status: AC
  Administered 2018-10-22: 300 mg via ORAL
  Filled 2018-10-22: qty 2

## 2018-10-22 MED ORDER — CLINDAMYCIN HCL 150 MG PO CAPS: 300.0000 mg | ORAL_CAPSULE | Freq: Three times a day (TID) | ORAL | 0 refills | Status: DC

## 2018-10-22 MED ORDER — HYDROCODONE-ACETAMINOPHEN 5-325 MG PO TABS: 10.0000 | ORAL_TABLET | ORAL | 0 refills | Status: DC | PRN

## 2018-10-22 MED ORDER — HYDROCODONE-ACETAMINOPHEN 5-325 MG PO TABS
1.0000 | ORAL_TABLET | Freq: Once | ORAL | Status: AC
  Administered 2018-10-22: 1 via ORAL
  Filled 2018-10-22: qty 1

## 2018-10-22 NOTE — ED Triage Notes (Signed)
Pt here for c/o dental pain X2-3 days. Pt states she has broken tooth and swelling in the upper palate of her mouth. Pt also reports some nausea.

## 2018-10-22 NOTE — Discharge Instructions (Addendum)
Take Clindamycin three times daily Take pain medicine as directed

## 2018-10-22 NOTE — ED Notes (Signed)
Suction set up for abscess drainage.

## 2018-10-22 NOTE — ED Provider Notes (Signed)
[facility] EMERGENCY DEPARTMENT Provider Note   CSN: [id number] Arrival date & time: 10/22/18  1027     History   Chief Complaint Chief Complaint  Patient presents with  . Dental Pain    HPI Elaine Gibson is a 42 y.o. female who presents with a dental abscess. PMH significant for COPD. She states that she's had issues with her top left tooth on and off for a while but over the past 2-3 days she's had gradually worsening pain and swelling over that tooth after eating caramel popcorn. She does not have a dentist but will be getting dental insurance at the beginning of next month. She denies fever or inability to swallow. She has been taking Aleve for pain. She thinks the abscess has been draining a little because she's had nausea. No vomiting or abd pain.  HPI  Past Medical History:  Diagnosis Date  . Emphysema lung ([place])   . Scoliosis     There are no active problems to display for this patient.   Past Surgical History:  Procedure Laterality Date  . CESAREAN SECTION       OB History   No obstetric history on file.      Home Medications    Prior to Admission medications   Medication Sig Start Date End Date Taking? Authorizing Provider  amoxicillin (AMOXIL) 500 MG capsule Take 1 capsule (500 mg total) by mouth 3 (three) times daily. Patient not taking: Reported on 11/01/2017 11/04/16   Ashley Murrain, NP  aspirin-sod bicarb-citric acid (ALKA-SELTZER) 325 MG TBEF tablet Take 325 mg by mouth every 6 (six) hours as needed (cold symptoms).    [provider]  benzonatate (TESSALON) 100 MG capsule Take 1 capsule (100 mg total) by mouth every 8 (eight) hours. 11/01/17   Hedges, Dellis Filbert, PA-C  cyclobenzaprine (FLEXERIL) 5 MG tablet Take 1 tablet (5 mg total) by mouth 2 (two) times daily as needed for muscle spasms. 05/08/18   Horton, Barbette Hair, MD  DM-Doxylamine-Acetaminophen (NYQUIL COLD & FLU PO) Take 1 capsule by mouth at bedtime as needed  (clcol).    [provider]  methocarbamol (ROBAXIN) 500 MG tablet Take 1 tablet (500 mg total) by mouth 2 (two) times daily. Patient not taking: Reported on 11/01/2017 01/07/15   Noe Gens, PA-C  naproxen (NAPROSYN) 500 MG tablet Take 1 tablet (500 mg total) by mouth 2 (two) times daily. 05/08/18   Horton, Barbette Hair, MD  oxyCODONE-acetaminophen (PERCOCET/ROXICET) 5-325 MG per tablet Take 1-2 tablets by mouth every 6 (six) hours as needed for severe pain. Patient not taking: Reported on 11/01/2017 01/07/15   Noe Gens, PA-C  traMADol (ULTRAM) 50 MG tablet Take 1 tablet (50 mg total) by mouth every 6 (six) hours as needed. Patient not taking: Reported on 11/01/2017 11/04/16   Ashley Murrain, NP    Family History History reviewed. No pertinent family history.  Social History Social History   Tobacco Use  . Smoking status: Current Every Day Smoker    Types: Cigarettes  . Smokeless tobacco: Never Used  Substance Use Topics  . Alcohol use: No  . Drug use: Not on file     Allergies   Patient has no known allergies.   Review of Systems Review of Systems  Constitutional: Negative for fever.  HENT: Positive for dental problem.      Physical Exam Updated Vital Signs BP 98/76 (BP Location: Right Arm)   Pulse (!) 112  Temp 98.7 F (37.1 C) (Oral)   Resp (!) 24   SpO2 99%   Physical Exam Vitals signs and nursing note reviewed.  Constitutional:      General: She is not in acute distress.    Appearance: Normal appearance. She is well-developed. She is not ill-appearing.     Comments: Calm and cooperative  HENT:     Head: Normocephalic and atraumatic.     Jaw: No trismus.     Mouth/Throat:     Mouth: Mucous membranes are moist.     Dentition: Abnormal dentition. Dental tenderness and dental abscesses present.     Pharynx: Oropharynx is clear.   Eyes:     General: No scleral icterus.       Right eye: No discharge.        Left eye: No discharge.      Conjunctiva/sclera: Conjunctivae normal.     Pupils: Pupils are equal, round, and reactive to light.  Neck:     Musculoskeletal: Normal range of motion.  Cardiovascular:     Rate and Rhythm: Normal rate.  Pulmonary:     Effort: Pulmonary effort is normal. No respiratory distress.  Abdominal:     General: There is no distension.  Skin:    General: Skin is warm and dry.  Neurological:     Mental Status: She is alert and oriented to person, place, and time.  Psychiatric:        Behavior: Behavior normal.      ED Treatments / Results  Labs (all labs ordered are listed, but only abnormal results are displayed) Labs Reviewed - No data to display  EKG None  Radiology No results found.  Procedures .Marland KitchenIncision and Drainage Date/Time: 10/22/2018 1:00 PM Performed by: Recardo Evangelist, PA-C Authorized by: Recardo Evangelist, PA-C   Consent:    Consent obtained:  Verbal   Consent given by:  Patient   Risks discussed:  Bleeding, incomplete drainage, pain and damage to other organs   Alternatives discussed:  No treatment Universal protocol:    Procedure explained and questions answered to patient or proxy's satisfaction: yes     Relevant documents present and verified: yes     Test results available and properly labeled: yes     Imaging studies available: yes     Required blood products, implants, devices, and special equipment available: yes     Site/side marked: yes     Immediately prior to procedure a time out was called: yes     Patient identity confirmed:  Verbally with patient Location:    Type:  Abscess   Size:  1x1cm   Location:  Mouth   Mouth location:  Palate (upper left) Anesthesia (see MAR for exact dosages):    Anesthesia method:  Local infiltration   Local anesthetic:  Bupivacaine 0.5% WITH epi Procedure type:    Complexity:  Simple Procedure details:    Incision types:  Stab incision   Incision depth:  Dermal   Scalpel size: 18G needle.   Wound  management:  Probed and deloculated   Drainage:  Purulent   Drainage amount:  Moderate   Packing materials:  None Post-procedure details:    Patient tolerance of procedure:  Tolerated well, no immediate complications   (including critical care time)    Medications Ordered in ED Medications - No data to display   Initial Impression / Assessment and Plan / ED Course  I have reviewed the triage vital signs and the nursing  notes.  Pertinent labs & imaging results that were available during my care of the patient were reviewed by me and considered in my medical decision making (see chart for details).  42 year old female presents with dental pain and dental abscess over the upper left tooth on the inside gumline. I&D was performed and purulent drainage was expressed. She was given rx for pain medicine and antibiotics. She will be getting dental insurance at the beginning of Jan. She was advised to follow up and return if worsening  Final Clinical Impressions(s) / ED Diagnoses   Final diagnoses:  Dental abscess    ED Discharge Orders    None       Recardo Evangelist, PA-C 10/22/18 1302    Nat Christen, MD 10/23/18 [phone]

## 2018-10-22 NOTE — ED Notes (Signed)
Pt stable, ambulatory, states understanding of discharge instructions 

## 2018-12-13 ENCOUNTER — Ambulatory Visit (INDEPENDENT_AMBULATORY_CARE_PROVIDER_SITE_OTHER): Payer: BLUE CROSS/BLUE SHIELD | Admitting: Certified Nurse Midwife

## 2018-12-13 ENCOUNTER — Encounter: Payer: Self-pay | Admitting: Certified Nurse Midwife

## 2018-12-13 VITALS — BP 104/70 | HR 107 | Ht 65.0 in | Wt 93.5 lb

## 2018-12-13 DIAGNOSIS — Z113 Encounter for screening for infections with a predominantly sexual mode of transmission: Secondary | ICD-10-CM | POA: Diagnosis not present

## 2018-12-13 DIAGNOSIS — Z01419 Encounter for gynecological examination (general) (routine) without abnormal findings: Secondary | ICD-10-CM | POA: Diagnosis not present

## 2018-12-13 DIAGNOSIS — Z1151 Encounter for screening for human papillomavirus (HPV): Secondary | ICD-10-CM

## 2018-12-13 DIAGNOSIS — Z124 Encounter for screening for malignant neoplasm of cervix: Secondary | ICD-10-CM | POA: Diagnosis not present

## 2018-12-13 DIAGNOSIS — Z8709 Personal history of other diseases of the respiratory system: Secondary | ICD-10-CM

## 2018-12-13 MED ORDER — ALBUTEROL SULFATE HFA 108 (90 BASE) MCG/ACT IN AERS: 2.0000 | INHALATION_SPRAY | Freq: Every day | RESPIRATORY_TRACT | 2 refills | Status: DC

## 2018-12-13 NOTE — Addendum Note (Signed)
Addended by: Lajean Manes on: 12/13/2018 02:19 PM   Modules accepted: Orders

## 2018-12-13 NOTE — Progress Notes (Addendum)
GYNECOLOGY ANNUAL PREVENTATIVE CARE ENCOUNTER NOTE  Subjective:   Elaine Gibson is a 43 y.o. [phone] female here for a routine annual gynecologic exam.  Current complaints: none.   Denies abnormal vaginal bleeding, discharge, pelvic pain, problems with intercourse or other gynecologic concerns.    Gynecologic History Patient's last menstrual period was 12/03/2018. Contraception: none Last Pap: 2011. Results were: normal with negative HPV Last mammogram: 2011 per patient, unsure of results   Obstetric History OB History  Gravida Para Term Preterm AB Living  4 2 2  0 2 2  SAB TAB Ectopic Multiple Live Births  1 0 0 0 2    # Outcome Date GA Lbr Len/2nd Weight Sex Delivery Anes PTL Lv  4 Term 09/28/00    M CS-Unspec   LIV  3 Term 06/07/95    M CS-Unspec   LIV  2 AB           1 SAB             Past Medical History:  Diagnosis Date  . Emphysema lung ([place])   . Scoliosis     Past Surgical History:  Procedure Laterality Date  . BACK SURGERY    . CESAREAN SECTION      No current outpatient medications on file prior to visit.   No current facility-administered medications on file prior to visit.     No Known Allergies  Social History:  reports that she has been smoking cigarettes. She has never used smokeless tobacco. She reports that she does not drink alcohol or use drugs.  Family History  Problem Relation Age of Onset  . Hypertension Mother   . Stomach cancer Father     The following portions of the patient's history were reviewed and updated as appropriate: allergies, current medications, past family history, past medical history, past social history, past surgical history and problem list.  Review of Systems Pertinent items noted in HPI and remainder of comprehensive ROS otherwise negative.   Objective:  BP 104/70   Pulse (!) 107   Ht 5\' 5"  (1.651 m)   Wt 93 lb 8 oz (42.4 kg)   LMP 12/03/2018   BMI 15.56 kg/m  CONSTITUTIONAL: Well-developed,  well-nourished female in no acute distress.  HENT:  Normocephalic, atraumatic, External right and left ear normal. Oropharynx is clear and moist EYES: Conjunctivae and EOM are normal. Pupils are equal, round, and reactive to light. NECK: Normal range of motion, supple, no masses.  Normal thyroid.  SKIN: Skin is warm and dry. No rash noted. Not diaphoretic. No erythema. No pallor. MUSCULOSKELETAL: Normal range of motion. No tenderness.  No cyanosis, clubbing, or edema.  2+ distal pulses. NEUROLOGIC: Alert and oriented to person, place, and time. Normal reflexes, muscle tone coordination. No cranial nerve deficit noted. PSYCHIATRIC: Normal mood and affect. Normal behavior. Normal judgment and thought content. CARDIOVASCULAR: Normal heart rate noted, regular rhythm RESPIRATORY: Effort and breath sounds normal, no problems with respiration noted. BREASTS: Symmetric in size. No masses, skin changes, nipple drainage, or lymphadenopathy. ABDOMEN: Soft, normal bowel sounds, no distention noted.  No tenderness, rebound or guarding.  PELVIC: Normal appearing external genitalia; normal appearing vaginal mucosa and cervix.  No abnormal discharge noted.  Pap smear obtained.  Normal uterine size, no other palpable masses, no uterine or adnexal tenderness.  Assessment and Plan:  1. Encounter for annual routine gynecological examination - Normal well woman examination  - Cytology - PAP - MM 3D SCREEN BREAST BILATERAL; Future  2. Hx of emphysema - Patient request refill of inhaler since she has been unable to see a PCP, referral for primary care physician  - Educated and encouraged patient to stop smoking which has caused her emphysema to worsen  - albuterol (PROVENTIL HFA;VENTOLIN HFA) 108 (90 Base) MCG/ACT inhaler; Inhale 2 puffs into the lungs daily.  Dispense: 1 Inhaler; Refill: 2  Will follow up results of pap smear and manage accordingly. Mammogram scheduled Routine preventative health maintenance  measures emphasized. Please refer to After Visit Summary for other counseling recommendations.    Lajean Manes, [place] for [employer], [place]

## 2018-12-18 LAB — CYTOLOGY - PAP
Chlamydia: NEGATIVE
Diagnosis: UNDETERMINED — AB
HPV 16/18/45 genotyping: NEGATIVE
HPV: DETECTED — AB
Neisseria Gonorrhea: NEGATIVE

## 2018-12-20 DIAGNOSIS — E0590 Thyrotoxicosis, unspecified without thyrotoxic crisis or storm: Secondary | ICD-10-CM | POA: Diagnosis not present

## 2018-12-20 DIAGNOSIS — Z720 Tobacco use: Secondary | ICD-10-CM | POA: Diagnosis not present

## 2018-12-20 DIAGNOSIS — R634 Abnormal weight loss: Secondary | ICD-10-CM | POA: Diagnosis not present

## 2018-12-20 DIAGNOSIS — E46 Unspecified protein-calorie malnutrition: Secondary | ICD-10-CM | POA: Diagnosis not present

## 2018-12-20 DIAGNOSIS — R0602 Shortness of breath: Secondary | ICD-10-CM | POA: Diagnosis not present

## 2018-12-26 ENCOUNTER — Other Ambulatory Visit: Payer: Self-pay | Admitting: Family Medicine

## 2018-12-26 DIAGNOSIS — R0602 Shortness of breath: Secondary | ICD-10-CM

## 2018-12-26 DIAGNOSIS — R634 Abnormal weight loss: Secondary | ICD-10-CM

## 2018-12-27 ENCOUNTER — Other Ambulatory Visit (HOSPITAL_COMMUNITY)
Admission: RE | Admit: 2018-12-27 | Discharge: 2018-12-27 | Disposition: A | Discharge status: Home / Self Care | Payer: BLUE CROSS/BLUE SHIELD | Source: Ambulatory Visit | Attending: Obstetrics and Gynecology | Admitting: Obstetrics and Gynecology

## 2018-12-27 ENCOUNTER — Encounter: Payer: Self-pay | Admitting: Obstetrics and Gynecology

## 2018-12-27 ENCOUNTER — Ambulatory Visit: Payer: BLUE CROSS/BLUE SHIELD | Admitting: Obstetrics and Gynecology

## 2018-12-27 VITALS — BP 97/63 | HR 87 | Wt 93.0 lb

## 2018-12-27 DIAGNOSIS — R87610 Atypical squamous cells of undetermined significance on cytologic smear of cervix (ASC-US): Secondary | ICD-10-CM | POA: Insufficient documentation

## 2018-12-27 DIAGNOSIS — R87810 Cervical high risk human papillomavirus (HPV) DNA test positive: Secondary | ICD-10-CM | POA: Insufficient documentation

## 2018-12-27 DIAGNOSIS — N870 Mild cervical dysplasia: Secondary | ICD-10-CM | POA: Diagnosis not present

## 2018-12-27 DIAGNOSIS — Z3202 Encounter for pregnancy test, result negative: Secondary | ICD-10-CM | POA: Diagnosis not present

## 2018-12-27 DIAGNOSIS — Z01812 Encounter for preprocedural laboratory examination: Secondary | ICD-10-CM

## 2018-12-27 LAB — POCT URINE PREGNANCY: PREG TEST UR: NEGATIVE

## 2018-12-27 NOTE — Patient Instructions (Signed)
Human Papillomavirus Human papillomavirus (HPV) is the most common sexually transmitted infection (STI). It easily spreads from person to person (is contagious). HPV can cause genital warts and some cancers. The genital warts can be seen and felt. Also, there may be wartlike areas in the throat. HPV may not have any symptoms. It is possible to have HPV for a long time and not know it. You may spread HPV on to others without knowing it. Follow these instructions at home: Medicines  Take over-the-counter and prescription medicines only as told by your doctor. This include creams for itching or irritation.  Do not treat genital warts with medicines for hand warts. How is this prevented?  Talk with your doctor about getting the HPV shots (vaccines). Males and females between ages 30 and 30 should get the shots. The shots will not work if you already have HPV. Pregnant women should not get the shots.  After treatment, use condoms during sex. This helps to prevent future infections.  Have only one sex partner.  Have a sex partner who does not have other sex partners.  Get Pap tests as told by your doctor. General instructions  Do not touch or scratch warts.  Do not have sex while you are being treated.  Do not douche or use tampons during treatment.  Tell your sex partner about your infection. He or she may also need to be treated.  If you get pregnant, tell your doctor that you have HPV. Your doctor will monitor you during pregnancy.  Keep all follow-up visits as told by your doctor. This is important. Contact a doctor if:  The treated skin is red, swollen, or painful.  You have a fever.  You feel ill.  You feel lumps or pimples in or around your genital area.  You have bleeding from the vagina.  You have bleeding from the area that was treated.  You have pain during sex. Summary  Human papillomavirus (HPV) is the most common sexually transmitted infection (STI). It easily  spreads from person to person.  Talk with your doctor about getting the HPV shots (vaccines). Males and females between ages 60 and 73 should get the shots.  HPV may not have any symptoms.  Use creams for itching or irritation only as told by your doctor. This information is not intended to replace advice given to you by your health care provider. Make sure you discuss any questions you have with your health care provider. Document Released: 10/06/2008 Document Revised: 12/05/2016 Document Reviewed: 12/05/2016 Elsevier Interactive Patient Education  2019 [place] After This sheet gives you information about how to care for yourself after your procedure. Your doctor may also give you more specific instructions. If you have problems or questions, contact your doctor. What can I expect after the procedure? If you did not have a tissue sample removed (did not have a biopsy), you may only have some spotting for a few days. You can go back to your normal activities. If you had a tissue sample removed, it is common to have:  Soreness and pain. This may last for a few days.  Light-headedness.  Mild bleeding from your vagina or dark-colored, grainy discharge from your vagina. This may last for a few days. You may need to wear a sanitary pad.  Spotting for at least 48 hours after the procedure. Follow these instructions at home:   Take over-the-counter and prescription medicines only as told by your doctor. Ask your doctor what  medicines you can start taking again. This is very important if you take blood-thinning medicine.  Do not drive or use heavy machinery while taking prescription pain medicine.  For 3 days, or as long as your doctor tells you, avoid: ? Douching. ? Using tampons. ? Having sex.  If you use birth control (contraception), keep using it.  Limit activity for the first day after the procedure. Ask your doctor what activities are safe for you.  It is  up to you to get the results of your procedure. Ask your doctor when your results will be ready.  Keep all follow-up visits as told by your doctor. This is important. Contact a doctor if:  You get a skin rash. Get help right away if:  You are bleeding a lot from your vagina. It is a lot of bleeding if you are using more than one pad an hour for 2 hours in a row.  You have clumps of blood (blood clots) coming from your vagina.  You have a fever.  You have chills  You have pain in your lower belly (pelvic area).  You have signs of infection, such as vaginal discharge that is: ? Different than usual. ? Yellow. ? Bad-smelling.  You have very pain or cramps in your lower belly that do not get better with medicine.  You feel light-headed.  You feel dizzy.  You pass out (faint). Summary  If you did not have a tissue sample removed (did not have a biopsy), you may only have some spotting for a few days. You can go back to your normal activities.  If you had a tissue sample removed, it is common to have mild pain and spotting for 48 hours.  For 3 days, or as long as your doctor tells you, avoid douching, using tampons and having sex.  Get help right away if you have bleeding, very bad pain, or signs of infection. This information is not intended to replace advice given to you by your health care provider. Make sure you discuss any questions you have with your health care provider. Document Released: 04/11/2008 Document Revised: 07/13/2016 Document Reviewed: 07/13/2016 Elsevier Interactive Patient Education  2019 [employer].

## 2018-12-27 NOTE — Progress Notes (Signed)
    GYNECOLOGY CLINIC COLPOSCOPY PROCEDURE NOTE  43 y.o. [id number] here for colposcopy for ASCUS with POSITIVE high risk HPV pap smear on 2/20. Discussed role for HPV in cervical dysplasia, need for surveillance.  Patient given informed consent, signed copy in the chart, time out was performed.  Placed in lithotomy position. Cervix viewed with speculum and colposcope after application of acetic acid.   Colposcopy adequate? Yes  no visible lesions;  biopsies were obtained at 12 and 6 o'clock position. ECC specimen obtained. All specimens were labelled and sent to pathology. Monsel's was applied   Patient was given post procedure instructions.  Will follow up pathology and manage accordingly.  Routine preventative health maintenance measures emphasized.    Arlina Robes, MD, [place] Attending [place] for [place]

## 2019-01-07 ENCOUNTER — Ambulatory Visit
Admission: RE | Admit: 2019-01-07 | Discharge: 2019-01-07 | Disposition: A | Discharge status: Home / Self Care | Payer: BLUE CROSS/BLUE SHIELD | Source: Ambulatory Visit | Attending: Family Medicine | Admitting: Family Medicine

## 2019-01-07 DIAGNOSIS — R0602 Shortness of breath: Secondary | ICD-10-CM

## 2019-01-07 DIAGNOSIS — R634 Abnormal weight loss: Secondary | ICD-10-CM

## 2019-01-07 DIAGNOSIS — J439 Emphysema, unspecified: Secondary | ICD-10-CM | POA: Diagnosis not present

## 2019-01-16 ENCOUNTER — Ambulatory Visit
Admission: RE | Admit: 2019-01-16 | Discharge: 2019-01-16 | Disposition: A | Discharge status: Home / Self Care | Payer: BLUE CROSS/BLUE SHIELD | Source: Ambulatory Visit | Attending: Certified Nurse Midwife | Admitting: Certified Nurse Midwife

## 2019-01-16 ENCOUNTER — Other Ambulatory Visit: Payer: Self-pay | Admitting: Family Medicine

## 2019-01-16 ENCOUNTER — Other Ambulatory Visit: Payer: Self-pay

## 2019-01-16 DIAGNOSIS — Z1231 Encounter for screening mammogram for malignant neoplasm of breast: Secondary | ICD-10-CM | POA: Diagnosis not present

## 2019-01-16 DIAGNOSIS — Z01419 Encounter for gynecological examination (general) (routine) without abnormal findings: Secondary | ICD-10-CM

## 2019-01-16 DIAGNOSIS — E041 Nontoxic single thyroid nodule: Secondary | ICD-10-CM

## 2019-01-18 ENCOUNTER — Ambulatory Visit
Admission: RE | Admit: 2019-01-18 | Discharge: 2019-01-18 | Disposition: A | Discharge status: Home / Self Care | Payer: BLUE CROSS/BLUE SHIELD | Source: Ambulatory Visit | Attending: Family Medicine | Admitting: Family Medicine

## 2019-01-18 ENCOUNTER — Other Ambulatory Visit: Payer: Self-pay

## 2019-01-18 DIAGNOSIS — E041 Nontoxic single thyroid nodule: Secondary | ICD-10-CM

## 2019-01-21 DIAGNOSIS — E0590 Thyrotoxicosis, unspecified without thyrotoxic crisis or storm: Secondary | ICD-10-CM | POA: Diagnosis not present

## 2019-01-21 DIAGNOSIS — J439 Emphysema, unspecified: Secondary | ICD-10-CM | POA: Diagnosis not present

## 2019-01-21 DIAGNOSIS — Z13220 Encounter for screening for lipoid disorders: Secondary | ICD-10-CM | POA: Diagnosis not present

## 2019-01-21 DIAGNOSIS — E041 Nontoxic single thyroid nodule: Secondary | ICD-10-CM | POA: Diagnosis not present

## 2019-01-22 ENCOUNTER — Other Ambulatory Visit: Payer: Self-pay | Admitting: Family Medicine

## 2019-01-22 DIAGNOSIS — E041 Nontoxic single thyroid nodule: Secondary | ICD-10-CM

## 2019-01-30 ENCOUNTER — Other Ambulatory Visit: Payer: BLUE CROSS/BLUE SHIELD

## 2019-02-27 ENCOUNTER — Other Ambulatory Visit: Payer: BLUE CROSS/BLUE SHIELD

## 2019-03-27 ENCOUNTER — Other Ambulatory Visit (HOSPITAL_COMMUNITY)
Admission: RE | Admit: 2019-03-27 | Discharge: 2019-03-27 | Disposition: A | Discharge status: Home / Self Care | Payer: BLUE CROSS/BLUE SHIELD | Source: Ambulatory Visit | Attending: Radiology | Admitting: Radiology

## 2019-03-27 ENCOUNTER — Ambulatory Visit
Admission: RE | Admit: 2019-03-27 | Discharge: 2019-03-27 | Disposition: A | Discharge status: Home / Self Care | Payer: BLUE CROSS/BLUE SHIELD | Source: Ambulatory Visit | Attending: Family Medicine | Admitting: Family Medicine

## 2019-03-27 DIAGNOSIS — E041 Nontoxic single thyroid nodule: Secondary | ICD-10-CM | POA: Diagnosis not present

## 2019-03-27 NOTE — Procedures (Signed)
PROCEDURE SUMMARY:  Using direct ultrasound guidance, 4 passes were made using 25 g needles into the nodule within the left lobe of the thyroid.   Ultrasound was used to confirm needle placements on all occasions.   EBL = trace  Specimens were sent to Pathology for analysis.  See procedure note under Imaging tab in Epic for full procedure details.  Judie Grieve Oluwadamilola Rosamond PA-C 03/27/2019 8:37 AM

## 2019-03-28 ENCOUNTER — Other Ambulatory Visit: Payer: BLUE CROSS/BLUE SHIELD

## 2019-04-23 DIAGNOSIS — R630 Anorexia: Secondary | ICD-10-CM | POA: Diagnosis not present

## 2019-04-23 DIAGNOSIS — J439 Emphysema, unspecified: Secondary | ICD-10-CM | POA: Diagnosis not present

## 2019-06-03 ENCOUNTER — Ambulatory Visit: Payer: BLUE CROSS/BLUE SHIELD | Admitting: Pulmonary Disease

## 2019-06-03 ENCOUNTER — Other Ambulatory Visit: Payer: Self-pay

## 2019-06-03 ENCOUNTER — Encounter: Payer: Self-pay | Admitting: Pulmonary Disease

## 2019-06-03 VITALS — BP 102/54 | HR 110 | Temp 98.5°F | Ht 65.0 in | Wt 90.0 lb

## 2019-06-03 DIAGNOSIS — R0602 Shortness of breath: Secondary | ICD-10-CM

## 2019-06-03 DIAGNOSIS — J432 Centrilobular emphysema: Secondary | ICD-10-CM | POA: Diagnosis not present

## 2019-06-03 LAB — COMPREHENSIVE METABOLIC PANEL
ALT: 20 U/L (ref 0–35)
AST: 44 U/L — ABNORMAL HIGH (ref 0–37)
Albumin: 4.5 g/dL (ref 3.5–5.2)
Alkaline Phosphatase: 55 U/L (ref 39–117)
BUN: 11 mg/dL (ref 6–23)
CO2: 25 mEq/L (ref 19–32)
Calcium: 9.6 mg/dL (ref 8.4–10.5)
Chloride: 106 mEq/L (ref 96–112)
Creatinine, Ser: 0.9 mg/dL (ref 0.40–1.20)
GFR: 82.58 mL/min (ref >60.00)
Glucose, Bld: 85 mg/dL (ref 70–99)
Potassium: 4.2 mEq/L (ref 3.5–5.1)
Sodium: 137 mEq/L (ref 135–145)
Total Bilirubin: 0.4 mg/dL (ref 0.2–1.2)
Total Protein: 7.6 g/dL (ref 6.0–8.3)

## 2019-06-03 MED ORDER — PREDNISONE 20 MG PO TABS: 40.0000 mg | ORAL_TABLET | Freq: Every day | ORAL | 0 refills | Status: DC

## 2019-06-03 MED ORDER — TRELEGY ELLIPTA 100-62.5-25 MCG/INH IN AEPB: 1.0000 | INHALATION_SPRAY | Freq: Every day | RESPIRATORY_TRACT | 0 refills | Status: DC

## 2019-06-03 MED ORDER — TRELEGY ELLIPTA 100-62.5-25 MCG/INH IN AEPB: 1.0000 | INHALATION_SPRAY | Freq: Every day | RESPIRATORY_TRACT | 6 refills | Status: DC

## 2019-06-03 MED ORDER — PREDNISONE 20 MG PO TABS: 30.0000 mg | ORAL_TABLET | Freq: Every day | ORAL | 0 refills | Status: AC

## 2019-06-03 MED ORDER — AZITHROMYCIN 250 MG PO TABS: ORAL_TABLET | ORAL | 0 refills | Status: DC

## 2019-06-03 NOTE — Progress Notes (Signed)
Subjective:   PATIENT ID: Elaine Gibson GENDER: female DOB: [birth year], MRN: [id number]   HPI  Chief Complaint  Patient presents with  . Consult    emphysema dx in 2011 wheezing shortness of breath onset >1 year    Reason for Visit: New consult for emphysema  [name] is a 43 year old female with history of tobacco abuse, emphysema and anorexia who presents to establish care for management of her emphysema.  She reports she has been diagnosed with emphysema since 2011, though she has had respiratory issues for many years.  She previously smoked 1 to 3 cigars a day since her early 63s but quit earlier this year when her shortness of breath, wheezing and chronic cough.  Her symptoms have been worsening in the last 5 months.  Cough is not productive but she feels congested.  Symptoms are aggravated by activity and she is unable to walk up a flight of stairs without having to stop.  Denies fevers, chills, chest pain.  She was evaluated by her PCP and CT chest was obtained which demonstrated emphysema.  She was referred to pulmonary for further evaluation.  She has taken Symbicort for at least 5 months with some improvement in her symptoms.  She was not previously on any other medications due to lack of insurance.  Denies any personal or family history of liver issues.  Is not sure if other family members had emphysema.  Denies fevers, chills, chest pain.  She reports that she is extremely tired and her sister has caught her not breathing while sleeping and she will wake up with headaches.  Social History: Since 40s. Smoke 1-3 cigars. Quit this year Currently works at [employer] at [employer] Family history significant for her mother passing away in her 71s for unclear reason. Denies high risk behaviors.  No IV drug use or high-risk sexual partners.  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  Past Medical History:  Diagnosis Date  . Emphysema lung ([place])   . Emphysema of lung ([place])   . Hypothyroidism    tumors   . Scoliosis      Family History  Problem Relation Age of Onset  . Hypertension Mother   . Kidney disease Mother   . Stomach cancer Father   . Gestational diabetes Sister      Social History   Occupational History  . Not on file  Tobacco Use  . Smoking status: Former Smoker    Years: 33.00    Types: Cigars    Start date: 34    Quit date: 05/04/2019    Years since quitting: 0.0  . Smokeless tobacco: Never Used  . Tobacco comment: 3 black and milds a day   Substance and Sexual Activity  . Alcohol use: No  . Drug use: Never  . Sexual activity: Not Currently    Partners: Male    Birth control/protection: None    No Known Allergies   Outpatient Medications Prior to Visit  Medication Sig Dispense Refill  . albuterol (PROVENTIL HFA;VENTOLIN HFA) 108 (90 Base) MCG/ACT inhaler Inhale 2 puffs into the lungs daily. 1 Inhaler 2  . budesonide-formoterol (SYMBICORT) 160-4.5 MCG/ACT inhaler Inhale 2 puffs into the lungs 2 (two) times daily.    . varenicline (  CHANTIX) 1 MG tablet Take 1 mg by mouth 2 (two) times daily.     No facility-administered medications prior to visit.     Review of Systems  Constitutional: Positive for diaphoresis. Negative for chills, fever, malaise/fatigue and weight loss.  HENT: Positive for congestion. Negative for ear pain and sore throat.   Respiratory: Positive for cough, shortness of breath and wheezing. Negative for hemoptysis and sputum production.   Cardiovascular: Negative for chest pain, palpitations and leg swelling.  Gastrointestinal: Positive for nausea. Negative for abdominal pain and heartburn.  Genitourinary:  Negative for frequency.  Musculoskeletal: Positive for joint pain and myalgias.  Skin: Positive for itching. Negative for rash.  Neurological: Positive for dizziness, weakness and headaches.  Endo/Heme/Allergies: Bruises/bleeds easily.  Psychiatric/Behavioral: Negative for depression. The patient is not nervous/anxious.      Objective:   Vitals:   06/03/19 1103 06/03/19 1104  BP:  (!) 102/54  Pulse:  (!) 110  Temp: 98.5 F (36.9 C)   TempSrc: Oral   SpO2:  94%  Weight: 90 lb (40.8 kg)   Height: _0  (1.651 m)    SpO2: 94 % O2 Device: None (Room air)  Physical Exam: General: Very thin-appearing female, no acute distress HENT: Elaine Gibson, AT Eyes: EOMI, no scleral icterus Respiratory: Rhonchi bilaterally, mild wheezing in all lung fields Cardiovascular: RRR, -M/R/G, no JVD GI: BS+, soft, nontender Extremities:-Edema,-tenderness Neuro: AAO x4, CNII-XII grossly intact Skin: Intact, no rashes or bruising Psych: Normal mood, normal affect  Data Reviewed:  Imaging: CT chest 01/07/2019-Very severe emphysema with large right upper lobe bullae.  Pulmonary nodules measuring 6 x 4 mm in the right lower lobe nodule 4 x 4 mm in the left lower lobe.  PFT: None on file   Labs: CBC and CMP 11/01/17 reviewed. Hg 13.6 and Cr 1.04  Imaging, labs and tests noted above have been reviewed independently by me.    Assessment & Plan:   Discussion: 43 year old female former smoker and bullous emphysema with longstanding history of dyspnea on exertion.  I reviewed imaging with patient and discussed my concerns about her severe lung disease that are likely contributing to her symptoms.  Given her age and bullous emphysema, will need to rule out alpha-1 antitrypsin deficiency and evaluate with pulmonary function test.  We will also obtain arterial blood gas to rule out hypercapnia related to her emphysema.  She may also benefit from sleep study for her witnessed apnea but will defer testing for now.   Medications  STOP Symbicort 160-4.5 mcg 2 puffs twice a day  START Trelegy 1 puff a day  Tests  Pulmonary function test  Arterial blood gas  Alpha-1 antitrypsin level   Complete metabolic panel (check your liver and kidney)  HIV test   Health Maintenance Pneumonia Due Influenza Due  Orders Placed This Encounter  Procedures  . Alpha-1 antitrypsin phenotype    Standing Status:   Future    Number of Occurrences:   1    Standing Expiration Date:   06/02/2020  . HIV antibody (with reflex)    Standing Status:   Future    Number of Occurrences:   1    Standing Expiration Date:   06/02/2020  . Comp Met (CMET)    Standing Status:   Future    Number of Occurrences:   1    Standing Expiration Date:   06/02/2020  . AMB Referral to [facility]    Referral Priority:   Routine  Referral Type:   Consultation    Referral Reason:   [facility]    Number of Visits Requested:   1  . Pulmonary function test    Standing Status:   Future    Standing Expiration Date:   06/02/2020    Order Specific Question:   Where should this test be performed?    Answer:   [facility]    Order Specific Question:   Full PFT: includes the following: basic spirometry, spirometry pre & post bronchodilator, diffusion capacity (DLCO), lung volumes    Answer:   Full PFT  . Pulmonary function test    Standing Status:   Future    Standing Expiration Date:   06/02/2020    Order Specific Question:   Where should this test be performed?    Answer:   [facility]    Order Specific Question:   ABG    Answer:   Yes   Meds ordered this encounter  Medications  . Fluticasone-Umeclidin-Vilant (TRELEGY ELLIPTA) 100-62.5-25 MCG/INH AEPB    Sig: Inhale 1 puff into the lungs daily.    Dispense:  1 each    Refill:  0    Order Specific Question:   Lot Number?    Answer:   [id number]    Order Specific Question:   Expiration Date?    Answer:   05/07/2020    Order Specific Question:   Manufacturer?    Answer:    GlaxoSmithKline [[id number]]    Order Specific Question:   Quantity    Answer:   2  . Fluticasone-Umeclidin-Vilant (TRELEGY ELLIPTA) 100-62.5-25 MCG/INH AEPB    Sig: Inhale 1 puff into the lungs daily.    Dispense:  1 each    Refill:  6  . DISCONTD: predniSONE (DELTASONE) 20 MG tablet    Sig: Take 2 tablets (40 mg total) by mouth daily with breakfast for 5 days.    Dispense:  10 tablet    Refill:  0  . azithromycin (ZITHROMAX) 250 MG tablet    Sig: As directed    Dispense:  6 tablet    Refill:  0  . predniSONE (DELTASONE) 20 MG tablet    Sig: Take 1.5 tablets (30 mg total) by mouth daily with breakfast for 5 days.    Dispense:  10 tablet    Refill:  0    Return in about 2 months (around 08/04/2019).  Elaine Gibson Rodman Pickle, MD [facility] 06/03/2019 12:26 PM  Office Number [phone]

## 2019-06-03 NOTE — Patient Instructions (Addendum)
Medications  STOP Symbicort 160-4.5 mcg 2 puffs twice a day  START Trelegy 1 puff a day  Tests  Pulmonary function test  Arterial blood gas  Alpha-1 antitrypsin level   Complete metabolic panel (check your liver and kidney)  HIV test   Alpha-1 Antitrypsin Deficiency, Adult  Alpha-1 antitrypsin (AAT) is a protein that helps the lungs and other organs work properly. It is made by the liver. AAT deficiency is a genetic condition that happens when the liver produces too little AAT, no AAT, or AAT that does not work properly. AAT deficiency can cause liver disease, lung disease, and a skin condition called panniculitis (rare). Being around things like smoke and dust can make these problems worse. What are the causes? This condition is caused by a genetic defect that is passed from parent to child (inherited). The condition typically develops only if a person inherits the defective gene from both parents. What increases the risk? You are more likely to develop this condition if:  You have a family history of the condition.  You are Caucasian and have European ancestry. What are the signs or symptoms? Symptoms of this condition include:  Shortness of breath.  Asthma that is difficult to control.  Having noisy breathing (wheeze).  Coughing.  Recurring infections in the breathing (respiratory) system.  Unexplained weight loss.  Rapid heartbeat.  Fatigue.  Abdominal pain.  A yellowish color of the skin or the white parts of the eyes (jaundice).  Swelling of the ankles or abdomen.  Hardened skin with painful lumps(panniculitis). How is this diagnosed? This condition is diagnosed with a blood test or with a DNA sample taken from your mouth. You may also have other tests, including:  Imaging studies of your chest, such as an X-ray or CT scan.  Lung function tests to see how well your lungs are working.  Liver function tests to see how well your liver is working.  A  liver biopsy to check for damage to your liver. How is this treated? There is no cure for AAT deficiency. However, treatments can relieve symptoms and improve quality of life. Treatment options include:  Inhaled steroids or bronchodilators. These medicines can help with breathing problems.  Pulmonary rehabilitation. This is a program that helps to improve lung function and teaches you to breathe more efficiently.  AAT augmentation therapy. This involves weekly injections to increase your level of AAT.  Prescription vitamins, including vitamins D, E, and K. Vitamins can help to maintain normal liver function.  Medicine to relieve symptoms that are related to liver problems, including jaundice, fluid retention, and severe itching.  A lung transplant. This is rarely needed. It may help someone who has a severe AAT deficiency. Follow these instructions at home: Lifestyle   Do not use any products that contain nicotine or tobacco, such as cigarettes and e-cigarettes. If you need help quitting, ask your health care provider.  Avoid secondhand smoke.  Do not drink alcohol.  Exercise regularly. Reducing breathing problems  Stay indoors when air quality is poor.  Wear a mask to keep your airways free of dust.  Talk to your health care provider about getting flu (influenza) and pneumonia shots to prevent respiratory infections.  Wash your hands often to prevent infections.  Avoid activities such as mowing the lawn or vacuuming when possible. General instructions  Take over-the-counter and prescription medicines only as told by your health care provider.  Keep all follow-up visits as told by your health care provider. This  is important. Contact a health care provider if you:  Often get respiratory infections, such as bronchitis or pneumonia.  Wheeze, cough, or have other breathing problems that are not helped with medicine.  Experience new liver-related problems, such as  jaundice. Get help right away if you:  Notice that your respiratory infection does not go away completely, or it returns after treatment.  Become dizzy or weak or you pass out because your breathing problems are so bad. Summary  Alpha-1 antitrypsin is a protein that is made in the liver and helps the lungs and other organs work properly.  A deficiency of this protein can occur if a person inherits the defective gene from both parents. It is diagnosed with a blood or DNA test.  Symptoms of the deficiency include coughing, shortness of breath, and fatigue.  The deficiency is treated with supportive measures that will help your breathing and sometimes with injections of the protein.  Call your health care provider if you have breathing problems that will not go away. This information is not intended to replace advice given to you by your health care provider. Make sure you discuss any questions you have with your health care provider. Document Released: 09/24/2004 Document Revised: 03/02/2018 Document Reviewed: 08/21/2017 Elsevier Patient Education  [place].  antitrypsin level

## 2019-06-03 NOTE — Progress Notes (Signed)
Patient seen in the office today and instructed on use of trelegy ellipta inhaler.  Patient expressed understanding and demonstrated technique.

## 2019-06-04 ENCOUNTER — Telehealth: Payer: Self-pay | Admitting: Pulmonary Disease

## 2019-06-04 LAB — HIV ANTIBODY (ROUTINE TESTING W REFLEX): HIV 1&2 Ab, 4th Generation: NONREACTIVE

## 2019-06-04 NOTE — Telephone Encounter (Signed)
ATC patient but I received a busy tone. Will call back later.

## 2019-06-05 NOTE — Telephone Encounter (Signed)
Patient is returning phone call.  Patient phone number is [phone].

## 2019-06-05 NOTE — Telephone Encounter (Signed)
Attempted to call pt but line rang once and then went to a busy tone. Unable to leave a message.  Saw from the consult pt had with Dr. Loanne Drilling:  Family history significant for her mother passing away in her 83s for unclear reason.  It seems like pt was calling back to let Dr. Loanne Drilling know of how her mom passed away. From the message, pt said that her mom passed away from scleroderma, high blood pressure, and a bad heart.  Routing to Dr. Loanne Drilling as an Juluis Rainier

## 2019-06-05 NOTE — Telephone Encounter (Signed)
Called and spoke with pt letting her know that we sent the info to Dr. Loanne Drilling so she can know how her mom passed away and pt verbalized understanding.  Pt also wanted to make Dr. Loanne Drilling aware that she goes tomorrow for the ABG.

## 2019-06-06 ENCOUNTER — Other Ambulatory Visit: Payer: Self-pay

## 2019-06-06 ENCOUNTER — Ambulatory Visit (HOSPITAL_COMMUNITY)
Admission: RE | Admit: 2019-06-06 | Discharge: 2019-06-06 | Disposition: A | Discharge status: Home / Self Care | Payer: BC Managed Care – PPO | Source: Ambulatory Visit | Attending: Pulmonary Disease | Admitting: Pulmonary Disease

## 2019-06-06 DIAGNOSIS — J432 Centrilobular emphysema: Secondary | ICD-10-CM | POA: Diagnosis not present

## 2019-06-06 LAB — BLOOD GAS, ARTERIAL
Acid-base deficit: 0.4 mmol/L (ref 0.0–2.0)
Bicarbonate: 23.5 mmol/L (ref 20.0–28.0)
Drawn by: 545251
FIO2: 21
O2 Saturation: 96.7 %
Patient temperature: 98.6
pCO2 arterial: 37.3 mmHg (ref 32.0–48.0)
pH, Arterial: 7.416 (ref 7.350–7.450)
pO2, Arterial: 77.9 mmHg — ABNORMAL LOW (ref 83.0–108.0)

## 2019-06-08 LAB — ALPHA-1 ANTITRYPSIN PHENOTYPE: A-1 Antitrypsin, Ser: 101 mg/dL (ref 83–199)

## 2019-06-12 ENCOUNTER — Encounter: Payer: Self-pay | Admitting: *Deleted

## 2019-06-12 ENCOUNTER — Encounter: Payer: Self-pay | Admitting: Pulmonary Disease

## 2019-06-12 NOTE — Progress Notes (Signed)
Please send letter to patient.

## 2019-07-01 ENCOUNTER — Telehealth: Payer: Self-pay | Admitting: Pulmonary Disease

## 2019-07-01 NOTE — Telephone Encounter (Signed)
Checking on status of PFT , supposed to be around 07/09/2019.

## 2019-07-04 NOTE — Telephone Encounter (Signed)
Scheduled pft 07/16/2019-pr

## 2019-07-08 ENCOUNTER — Other Ambulatory Visit: Payer: Self-pay | Admitting: Pulmonary Disease

## 2019-07-13 ENCOUNTER — Other Ambulatory Visit (HOSPITAL_COMMUNITY)
Admission: RE | Admit: 2019-07-13 | Discharge: 2019-07-13 | Disposition: A | Discharge status: Home / Self Care | Payer: BC Managed Care – PPO | Source: Ambulatory Visit | Attending: Pulmonary Disease | Admitting: Pulmonary Disease

## 2019-07-13 DIAGNOSIS — Z20828 Contact with and (suspected) exposure to other viral communicable diseases: Secondary | ICD-10-CM | POA: Insufficient documentation

## 2019-07-13 DIAGNOSIS — Z01812 Encounter for preprocedural laboratory examination: Secondary | ICD-10-CM | POA: Diagnosis not present

## 2019-07-14 LAB — NOVEL CORONAVIRUS, NAA (HOSP ORDER, SEND-OUT TO REF LAB; TAT 18-24 HRS): SARS-CoV-2, NAA: NOT DETECTED

## 2019-07-16 ENCOUNTER — Ambulatory Visit: Payer: BC Managed Care – PPO | Admitting: Pulmonary Disease

## 2019-07-18 ENCOUNTER — Ambulatory Visit (INDEPENDENT_AMBULATORY_CARE_PROVIDER_SITE_OTHER): Payer: BC Managed Care – PPO | Admitting: Adult Health

## 2019-07-18 ENCOUNTER — Ambulatory Visit (INDEPENDENT_AMBULATORY_CARE_PROVIDER_SITE_OTHER): Payer: BC Managed Care – PPO | Admitting: Pulmonary Disease

## 2019-07-18 ENCOUNTER — Other Ambulatory Visit: Payer: Self-pay

## 2019-07-18 ENCOUNTER — Encounter: Payer: Self-pay | Admitting: Adult Health

## 2019-07-18 VITALS — BP 112/70 | HR 71 | Temp 98.6°F | Ht 65.0 in | Wt 92.0 lb

## 2019-07-18 DIAGNOSIS — R911 Solitary pulmonary nodule: Secondary | ICD-10-CM | POA: Diagnosis not present

## 2019-07-18 DIAGNOSIS — J449 Chronic obstructive pulmonary disease, unspecified: Secondary | ICD-10-CM | POA: Diagnosis not present

## 2019-07-18 DIAGNOSIS — F17200 Nicotine dependence, unspecified, uncomplicated: Secondary | ICD-10-CM

## 2019-07-18 DIAGNOSIS — E440 Moderate protein-calorie malnutrition: Secondary | ICD-10-CM

## 2019-07-18 DIAGNOSIS — R0602 Shortness of breath: Secondary | ICD-10-CM

## 2019-07-18 DIAGNOSIS — R918 Other nonspecific abnormal finding of lung field: Secondary | ICD-10-CM

## 2019-07-18 DIAGNOSIS — J439 Emphysema, unspecified: Secondary | ICD-10-CM | POA: Diagnosis not present

## 2019-07-18 DIAGNOSIS — E46 Unspecified protein-calorie malnutrition: Secondary | ICD-10-CM | POA: Insufficient documentation

## 2019-07-18 LAB — PULMONARY FUNCTION TEST
DL/VA % pred: 80 %
DL/VA: 3.49 ml/min/mmHg/L
DLCO unc % pred: 42 %
DLCO unc: 9.61 ml/min/mmHg
FEF 25-75 Post: 0.48 L/sec
FEF 25-75 Pre: 0.56 L/sec
FEF2575-%Change-Post: -12 %
FEF2575-%Pred-Post: 17 %
FEF2575-%Pred-Pre: 19 %
FEV1-%Change-Post: -1 %
FEV1-%Pred-Post: 41 %
FEV1-%Pred-Pre: 42 %
FEV1-Post: 1.07 L
FEV1-Pre: 1.09 L
FEV1FVC-%Change-Post: 3 %
FEV1FVC-%Pred-Pre: 71 %
FEV6-%Change-Post: -5 %
FEV6-%Pred-Post: 56 %
FEV6-%Pred-Pre: 59 %
FEV6-Post: 1.73 L
FEV6-Pre: 1.83 L
FEV6FVC-%Change-Post: 0 %
FEV6FVC-%Pred-Post: 102 %
FEV6FVC-%Pred-Pre: 102 %
FVC-%Change-Post: -5 %
FVC-%Pred-Post: 55 %
FVC-%Pred-Pre: 58 %
FVC-Post: 1.74 L
FVC-Pre: 1.84 L
Post FEV1/FVC ratio: 62 %
Post FEV6/FVC ratio: 100 %
Pre FEV1/FVC ratio: 59 %
Pre FEV6/FVC Ratio: 100 %
RV % pred: 153 %
RV: 2.6 L
TLC % pred: 82 %
TLC: 4.29 L

## 2019-07-18 NOTE — Addendum Note (Signed)
Addended by: Parke Poisson E on: 07/18/2019 04:14 PM   Modules accepted: Orders

## 2019-07-18 NOTE — Assessment & Plan Note (Signed)
Add high protein diet and supplement, ensure

## 2019-07-18 NOTE — Assessment & Plan Note (Signed)
Severe COPD with bullous emphysema. MZ alpha-1 phenotype (adequate level Smoking cessation encouraged Referred to pulmonary rehab Continue Trelegy  Plan  Patient Instructions  Continue on Trelegy 1 puff daily Refer to pulmonary rehab  Work on quitting smoking completely .  Follow up with Dr. Loanne Drilling in 3 months and As needed

## 2019-07-18 NOTE — Assessment & Plan Note (Signed)
5 mm right lower lobe nodule, 4 mm left lower lobe nodule.  Will repeat CT chest March 2021

## 2019-07-18 NOTE — Progress Notes (Signed)
@Patient  ID: Elaine Gibson, female    DOB: 02-02-1976, 43 y.o.   MRN: [id number]  Chief Complaint  Patient presents with  . Follow-up    COPD    Referring provider: Lujean Amel, MD  HPI: 43 year old  smoker seen for pulmonary consult June 03, 2019 for emphysema  TEST/EVENTS :  CT chest 01/07/19 5 mm right lower lobe, 4 mm left lower lobe pulmonary nodule, severe bullous emphysema, 2.2 cm thyroid nodule  Social History: Since 57s. Smoke 1-3 cigars. Quit this year Currently works at [employer] at [employer] Family history significant for her mother passing away in her 69s for unclear reason. Denies high risk behaviors.  No IV drug use or high-risk sexual partners.  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  Patient was seen last visit for a pulmonary consult for emphysema.  Patient reports a previous diagnosis of emphysema in 2011.  She has smoked cigars 1 to 3 cigars a day since her early 16s.  She did quit smoking early 2020. Alpha-1 test showed MZ phenotype, level 101.  HIV was negative.  ABG showed pH 7.41, PCO2 37, PO2 77, bicarb 23, O2 saturation 96%. Last visit she was changed from Symbicort to Trelegy.  She says she likes it. Feels like it has worked better.   weight today is 92 . Discussed high protein diet.  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 Does get winded with heavy activity   PFTs today show severe COPD with an FEV1 at 41%, ratio 62 and FVC 55%, no significant bronchodilator response severe mid flow reversibility, DLCO 42%.  She does admit she is still smoking.  We discussed smoking cessation  No Known Allergies   There is no immunization history on file for this patient.  Past Medical  History:  Diagnosis Date  . Emphysema lung ([place])   . Emphysema of lung ([place])   . Hypothyroidism    tumors   . Scoliosis     Tobacco History: Social History   Tobacco Use  Smoking Status Former Smoker  . Years: 33.00  . Types: Cigars  . Start date: 62  . Quit date: 05/04/2019  . Years since quitting: 0.2  Smokeless Tobacco Never Used  Tobacco Comment   3 black and milds a day    Counseling given: Not Answered Comment: 3 black and milds a day    Outpatient Medications Prior to Visit  Medication Sig Dispense Refill  . albuterol (PROVENTIL HFA;VENTOLIN HFA) 108 (90 Base) MCG/ACT inhaler Inhale 2 puffs into the lungs daily. 1 Inhaler 2  . Fluticasone-Umeclidin-Vilant (TRELEGY ELLIPTA) 100-62.5-25 MCG/INH AEPB Inhale 1 puff into the lungs daily. 1 each 6  . varenicline (CHANTIX) 1 MG tablet Take 1 mg by mouth 2 (two) times daily.    Marland Kitchen azithromycin (ZITHROMAX) 250 MG tablet As directed (Patient not taking: Reported on 07/18/2019) 6 tablet 0  . budesonide-formoterol (SYMBICORT) 160-4.5 MCG/ACT inhaler Inhale 2 puffs into the lungs 2 (two) times daily.    . Fluticasone-Umeclidin-Vilant (TRELEGY ELLIPTA) 100-62.5-25 MCG/INH AEPB Inhale 1 puff into the lungs daily. 1 each 0   No facility-administered medications prior to visit.      Review of Systems:   Constitutional:   No   night sweats,  Fevers, chills,  +fatigue,  or  lassitude.  HEENT:   No headaches,  Difficulty swallowing,  Tooth/dental problems, or  Sore throat,                No sneezing, itching, ear ache, nasal congestion, post nasal drip,   CV:  No chest pain,  Orthopnea, PND, swelling in lower extremities, anasarca, dizziness, palpitations, syncope.   GI  No heartburn, indigestion, abdominal pain, nausea, vomiting, diarrhea, change in bowel habits, loss of appetite, bloody stools.   Resp:  ,  No coughing up of blood.  No change in color of mucus.  No wheezing.  No chest wall deformity  Skin: no rash or lesions.   GU: no dysuria, change in color of urine, no urgency or frequency.  No flank pain, no hematuria   MS:  No joint pain or swelling.  No decreased range of motion.  No back pain.    Physical Exam  BP 112/70 (BP Location: Left Arm, Cuff Size: Small)   Pulse 71   Temp 98.6 F (37 C) (Temporal)   Ht 5\' 5"  (1.651 m)   Wt 92 lb (41.7 kg)   SpO2 98%   BMI 15.31 kg/m   GEN: A/Ox3; pleasant , NAD, thin and frail    HEENT:  [place]/AT,  NOSE-clear, THROAT-clear, no lesions, no postnasal drip or exudate noted. Poor dentition   NECK:  Supple w/ fair ROM; no JVD; normal carotid impulses w/o bruits; no thyromegaly or nodules palpated; no lymphadenopathy.    RESP few scattered rhonchi   no accessory muscle use, no dullness to percussion  CARD:  RRR, no m/r/g, no peripheral edema, pulses intact, no cyanosis or clubbing.  GI:   Soft & nt; nml bowel sounds; no organomegaly or masses detected.   Musco: Warm bil, no deformities or joint swelling noted.   Neuro: alert, no focal deficits noted.    Skin: Warm, no lesions or rashes    Lab Results:  CBC  BNP No results found for: BNP  ProBNP No results found for: PROBNP  Imaging: No results found.    PFT Results Latest Ref Rng & Units 07/18/2019  FVC-Pre L 1.84  FVC-Predicted Pre % 58  FVC-Post L 1.74  FVC-Predicted Post % 55  Pre FEV1/FVC % % 59  Post FEV1/FCV % % 62  FEV1-Pre L 1.09  FEV1-Predicted Pre % 42  FEV1-Post L 1.07  DLCO UNC% % 42  DLCO COR %Predicted % 80  TLC L 4.29  TLC % Predicted % 82  RV % Predicted % 153    No results found for: NITRICOXIDE      Assessment & Plan:   Protein calorie malnutrition (HCC) Add high protein diet and supplement, ensure    COPD (chronic obstructive pulmonary disease) (HCC) Severe COPD with bullous emphysema. MZ alpha-1 phenotype (adequate level Smoking cessation encouraged Referred to pulmonary rehab Continue Trelegy  Plan  Patient Instructions  Continue on  Trelegy 1 puff daily Refer to pulmonary rehab  Work on quitting smoking completely .  Follow up with Dr. Loanne Drilling in 3 months and As needed        Pulmonary nodule 5 mm right lower lobe nodule, 4 mm left lower lobe nodule.  Will repeat CT chest March 2021  Smoking Smoking cessation encouraged     Rexene Edison, NP 07/18/2019

## 2019-07-18 NOTE — Assessment & Plan Note (Signed)
Smoking cessation encouraged!

## 2019-07-18 NOTE — Progress Notes (Signed)
Ful PFT performed today.

## 2019-07-18 NOTE — Patient Instructions (Addendum)
Continue on Trelegy 1 puff daily Refer to pulmonary rehab  Work on quitting smoking completely .  Follow up with Dr. Loanne Drilling in 3 months and As needed

## 2019-07-22 ENCOUNTER — Telehealth (HOSPITAL_COMMUNITY): Payer: Self-pay

## 2019-07-22 ENCOUNTER — Encounter (HOSPITAL_COMMUNITY): Payer: Self-pay | Admitting: *Deleted

## 2019-07-22 NOTE — Progress Notes (Addendum)
Received Office referral for this pt to participate in Pulmonary rehab with the diagnosis of COPD stage 3.by Dr. Loanne Drilling. Clinical review of pt follow up appt on 9/10 with Rexene Edison NP with [facility]  Pulmonary office note. Reviewed pt PFT which showed FEV1 post predicted 42.  Pt covid Risk Score - 1.Pt appropriate for scheduling for Pulmonary rehab.  Will forward to support staff for scheduling and verification of insurance eligibility/benefits with pt  consent. Cherre Huger, BSN Cardiac and Training and development officer

## 2019-07-22 NOTE — Telephone Encounter (Signed)
Pt insurance is active and benefits verified through [employer] Co-pay 0, DED $750/$750 met, out of pocket $6,000/$1,416.07 met, co-insurance 20%. no pre-authorization required, REF# [id number]

## 2019-07-24 ENCOUNTER — Telehealth (HOSPITAL_COMMUNITY): Payer: Self-pay

## 2019-08-26 ENCOUNTER — Telehealth (HOSPITAL_COMMUNITY): Payer: Self-pay

## 2019-08-26 ENCOUNTER — Ambulatory Visit (HOSPITAL_COMMUNITY): Payer: BC Managed Care – PPO

## 2019-09-16 ENCOUNTER — Ambulatory Visit (HOSPITAL_COMMUNITY): Payer: BC Managed Care – PPO

## 2019-09-16 ENCOUNTER — Telehealth (HOSPITAL_COMMUNITY): Payer: Self-pay | Admitting: *Deleted

## 2019-09-16 NOTE — Telephone Encounter (Signed)
Called to check on patient, she did not show for walk test/orientation to pulmonary rehab.  She forgot about the appointment. We were disconnected before I could reschedule a new appointment and I called her back 3 times without her answering and unable to leave a message.

## 2019-09-17 ENCOUNTER — Telehealth (HOSPITAL_COMMUNITY): Payer: Self-pay | Admitting: *Deleted

## 2019-09-17 NOTE — Telephone Encounter (Signed)
Called multiple times to reschedule her walk test/orientation to pulmonary rehab and she will not answer her phone, 4 attempts have been made. This is the last attempt, I am cancelling her referral.

## 2019-10-01 DIAGNOSIS — Z0000 Encounter for general adult medical examination without abnormal findings: Secondary | ICD-10-CM | POA: Diagnosis not present

## 2019-10-22 DIAGNOSIS — Z131 Encounter for screening for diabetes mellitus: Secondary | ICD-10-CM | POA: Diagnosis not present

## 2019-10-22 DIAGNOSIS — Z13220 Encounter for screening for lipoid disorders: Secondary | ICD-10-CM | POA: Diagnosis not present

## 2019-10-22 DIAGNOSIS — E0590 Thyrotoxicosis, unspecified without thyrotoxic crisis or storm: Secondary | ICD-10-CM | POA: Diagnosis not present

## 2019-10-22 DIAGNOSIS — Z0000 Encounter for general adult medical examination without abnormal findings: Secondary | ICD-10-CM | POA: Diagnosis not present

## 2019-11-07 ENCOUNTER — Other Ambulatory Visit: Payer: Self-pay

## 2019-11-07 ENCOUNTER — Ambulatory Visit: Payer: BC Managed Care – PPO | Admitting: Pulmonary Disease

## 2019-11-07 ENCOUNTER — Encounter: Payer: Self-pay | Admitting: Pulmonary Disease

## 2019-11-07 VITALS — BP 102/62 | HR 79 | Temp 97.3°F | Ht 65.0 in | Wt 95.0 lb

## 2019-11-07 DIAGNOSIS — J432 Centrilobular emphysema: Secondary | ICD-10-CM | POA: Diagnosis not present

## 2019-11-07 MED ORDER — TRELEGY ELLIPTA 100-62.5-25 MCG/INH IN AEPB: 1.0000 | INHALATION_SPRAY | Freq: Every day | RESPIRATORY_TRACT | 12 refills | Status: DC

## 2019-11-07 MED ORDER — TRELEGY ELLIPTA 100-62.5-25 MCG/INH IN AEPB: 1.0000 | INHALATION_SPRAY | Freq: Every day | RESPIRATORY_TRACT | 0 refills | Status: DC

## 2019-11-07 NOTE — Progress Notes (Signed)
Subjective:   PATIENT ID: Elaine Gibson GENDER: female DOB: [birth year], MRN: [id number]  Synopsis: Diagnosed with emphysema in 2011. Former smoker. Quit in 2020. Normal alpha-antitrypsin levels.  HPI  Chief Complaint  Patient presents with  . Follow-up    COPD. Patient stated her breathing is at 10 and at her baseline.     Reason for Visit: Follow-up  [name] is a 43 year old female former smoker with bullous emphysema who presents for follow-up.  She has been compliant with Trelegy daily which is doing well for her. Reports resolved dyspnea and cough. She reports quit smoking 6 months ago. 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  Reports poor appetite that has been ongoing for many years.  Social History: Since 54s. Smoke 1-3 cigars. Quit this year Currently works at [employer] at [employer] Family history significant for her mother passing away in her 57s for unclear reason. Denies high risk behaviors.  No IV drug use or high-risk sexual partners.  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  Outpatient Medications Prior to Visit  Medication Sig Dispense Refill  . albuterol (PROVENTIL HFA;VENTOLIN HFA) 108 (90 Base) MCG/ACT inhaler Inhale 2 puffs into the lungs daily. 1 Inhaler 2  . Fluticasone-Umeclidin-Vilant (TRELEGY ELLIPTA) 100-62.5-25 MCG/INH AEPB Inhale 1 puff into the lungs daily. 1 each 6  . varenicline (CHANTIX) 1 MG tablet Take 1 mg by mouth 2 (two) times daily.     No facility-administered medications prior to visit.    Review of Systems  Constitutional:  Negative for chills, diaphoresis, fever, malaise/fatigue and weight loss.  HENT: Negative for congestion.   Respiratory: Negative for cough, hemoptysis, sputum production, shortness of breath and wheezing.   Cardiovascular: Negative for chest pain, palpitations and leg swelling.     Objective:   Vitals:   11/07/19 1018  BP: 102/62  Pulse: 79  Temp: (!) 97.3 F (36.3 C)  TempSrc: Temporal  SpO2: 95%  Weight: 95 lb (43.1 kg)  Height: 5\' 5"  (1.651 m)     Physical Exam: General: Very thin-appearing, no acute distress HENT: [place], AT Eyes: EOMI, no scleral icterus Respiratory: Clear to auscultation bilaterally.  No crackles, wheezing or rales Cardiovascular: RRR, -M/R/G, no JVD Extremities:-Edema,-tenderness Neuro: AAO x4, CNII-XII grossly intact Skin: Intact, no rashes or bruising Psych: Normal mood, normal affect  Data Reviewed:  Imaging: CT chest 01/07/2019-Very severe emphysema with large right upper lobe bullae.  Pulmonary nodules measuring 6 x 4 mm in the right lower lobe nodule 4 x 4 mm in the left lower lobe.  PFT: 07/18/19 FVC 1.74 (55%) FEV1 1.07 (41%) Ratio 59  TLC 82% RV 153% DLCO 42% Interpretation: Severe emphysema  Labs: Alpha-1 antitrypsin phenotype: PI*MZ Serum level - 101  Imaging, labs and test noted above have been reviewed independently by me.  Assessment & Plan:   Discussion: 43 year old female former smoker with bullous emphysema who presents for follow-up. Previously reviewed imaging with patient and discussed my concerns about her severe lung disease that are likely contributing to her symptoms. Alpha-1 antitrypsin genotype MZ. Well-controlled on Trelegy. Consider referral for LVRS if symptomatic in the future.  Emphysema CONTINUE Trelegy ONE  puff once a day. REFILL Pulmonary Rehab referral placed. May not process due to current pandemic  Low Body Mass Index Encourage healthy diet including protein supplements drinks Recommend daily  multivitamin (try to include vitamin D, calcium, B6, B12 if possible) Please discuss with your primary care doctor for further evaluation  Health Maintenance Pneumonia Due Influenza Due  No orders of the defined types were placed in this encounter.  Meds ordered this encounter  Medications  . Fluticasone-Umeclidin-Vilant (TRELEGY ELLIPTA) 100-62.5-25 MCG/INH AEPB    Sig: Inhale 1 puff into the lungs daily.    Dispense:  1 each    Refill:  12  . Fluticasone-Umeclidin-Vilant (TRELEGY ELLIPTA) 100-62.5-25 MCG/INH AEPB    Sig: Inhale 1 puff into the lungs daily.    Dispense:  2 each    Refill:  0    Order Specific Question:   Lot Number?    Answer:   [id number]    Order Specific Question:   Manufacturer?    Answer:   [employer]Smith[employer]line [[id number]]    Order Specific Question:   Quantity    Answer:   2    Return in about 4 months (around 03/06/2020).  [place], MD [facility] 11/07/2019 8:49 AM  Office Number [phone]

## 2019-11-07 NOTE — Patient Instructions (Signed)
Emphysema CONTINUE Trelegy ONE puff once a day  Low Body Mass Index Encourage healthy diet including protein supplements drinks Recommend daily multivitamin (try to include vitamin D, calcium, B6, B12 if possible) Please discuss with your primary care doctor for further evaluation  Follow-up in 4 months

## 2020-01-21 ENCOUNTER — Other Ambulatory Visit: Payer: Self-pay

## 2020-01-21 ENCOUNTER — Ambulatory Visit (INDEPENDENT_AMBULATORY_CARE_PROVIDER_SITE_OTHER)
Admission: RE | Admit: 2020-01-21 | Discharge: 2020-01-21 | Disposition: A | Discharge status: Home / Self Care | Payer: BC Managed Care – PPO | Source: Ambulatory Visit | Attending: Adult Health | Admitting: Adult Health

## 2020-01-21 DIAGNOSIS — R918 Other nonspecific abnormal finding of lung field: Secondary | ICD-10-CM | POA: Diagnosis not present

## 2020-01-21 DIAGNOSIS — J439 Emphysema, unspecified: Secondary | ICD-10-CM | POA: Diagnosis not present

## 2020-01-22 ENCOUNTER — Other Ambulatory Visit: Payer: Self-pay | Admitting: Adult Health

## 2020-01-22 DIAGNOSIS — R911 Solitary pulmonary nodule: Secondary | ICD-10-CM

## 2020-01-22 NOTE — Progress Notes (Signed)
Called spoke with patient, advised of CT Chest results / recs as stated by [facility] NP.  Pt verbalized understanding and denied any questions.  Orders only encounter created for CT Chest order 01/2021.

## 2020-03-03 ENCOUNTER — Ambulatory Visit: Payer: BC Managed Care – PPO | Admitting: Pulmonary Disease

## 2020-12-27 IMAGING — US ULTRASOUND FNA BIOPSY THYROID 1ST LESION
1 series · 13 of 16 positions shown · non-contrast
Comparison: Ultrasound done January 18, 2019

MEDICATIONS:
1% lidocaine 5 mL

COMPLICATIONS:
None immediate.

INDICATION: Indeterminate thyroid nodule

EXAM:
ULTRASOUND GUIDED FINE NEEDLE ASPIRATION OF INDETERMINATE THYROID
NODULE
TECHNIQUE: Informed written consent was obtained from the patient after a
discussion of the risks, benefits and alternatives to treatment.
Questions regarding the procedure were encouraged and answered. A
timeout was performed prior to the initiation of the procedure.

[Series 1: ultrasound fna biopsy thyroid 1st lesion · 0.06mm/px · 16 acquisitions, 13 frames shown]
[im 1/16]
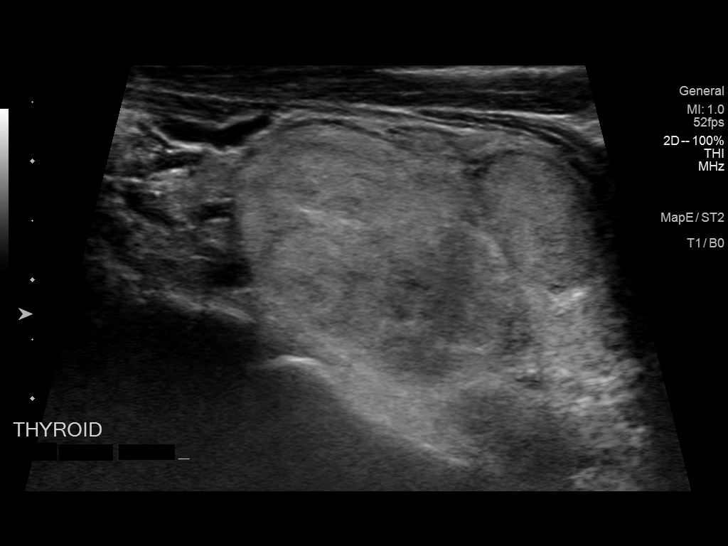
[im 2/16]
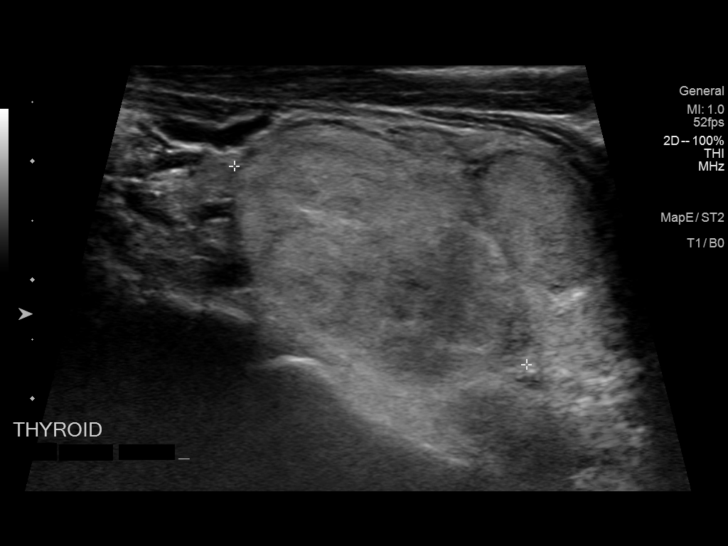
[im 4/16]
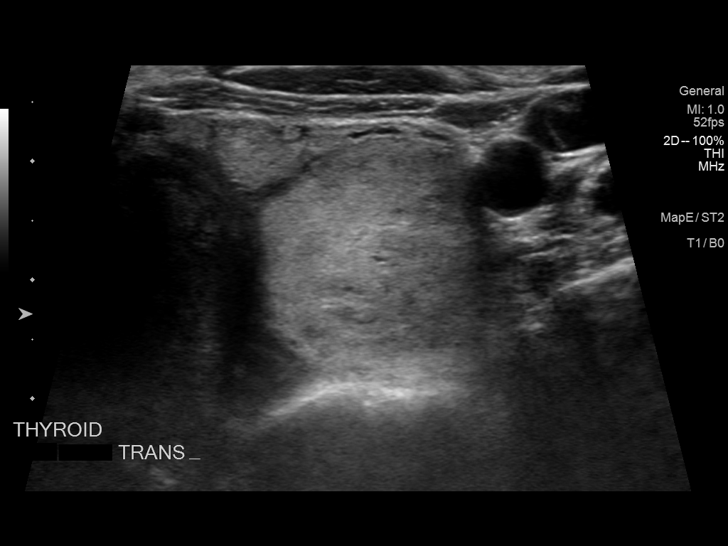
[im 5/16]
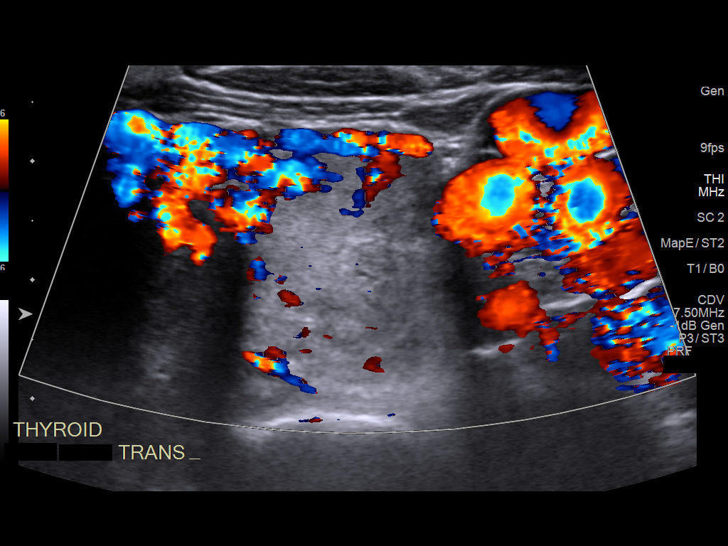
[im 6/16]
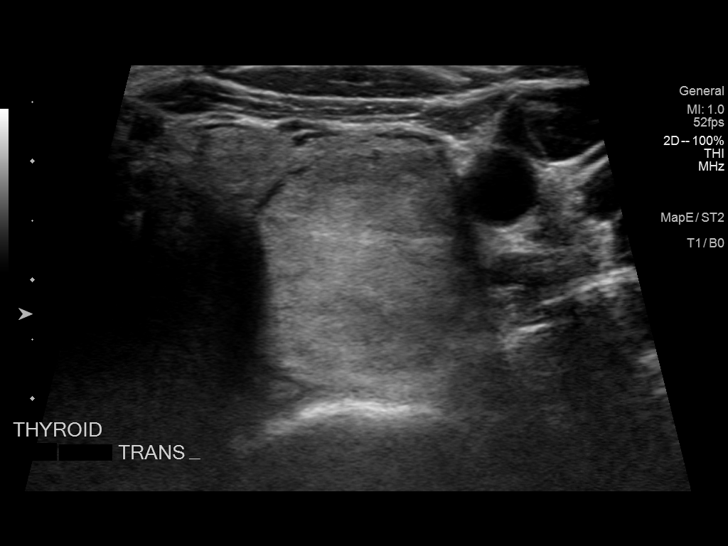
[im 7/16]
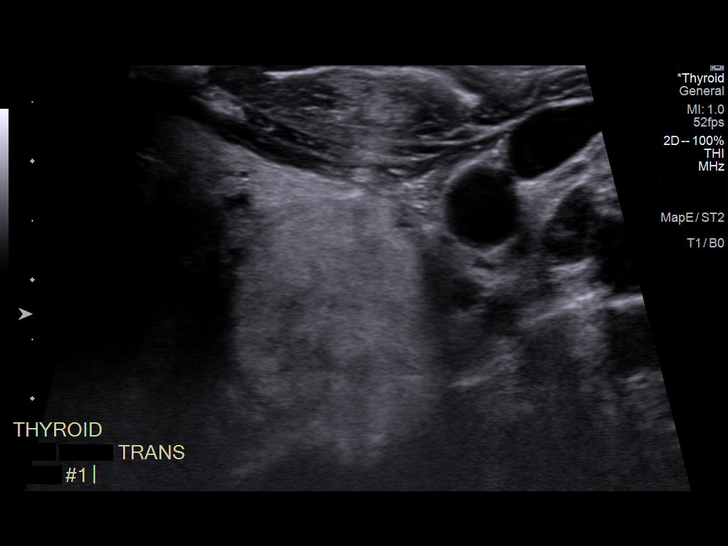
[im 9/16]
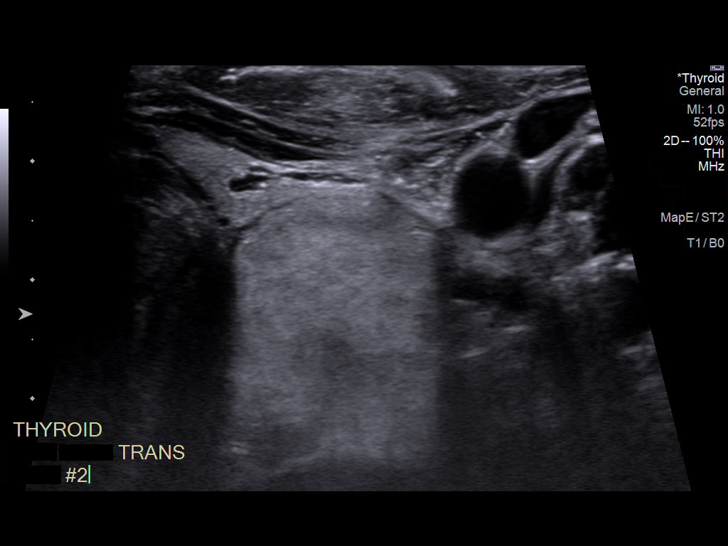
[im 10/16]
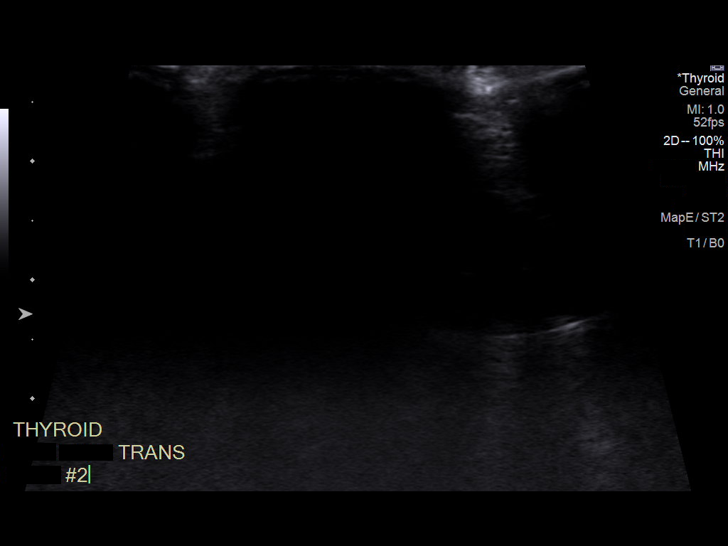
[im 11/16]
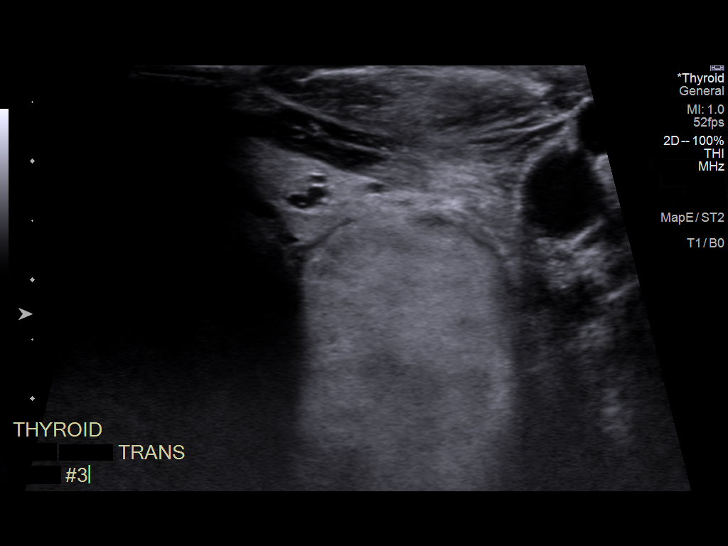
[im 12/16]
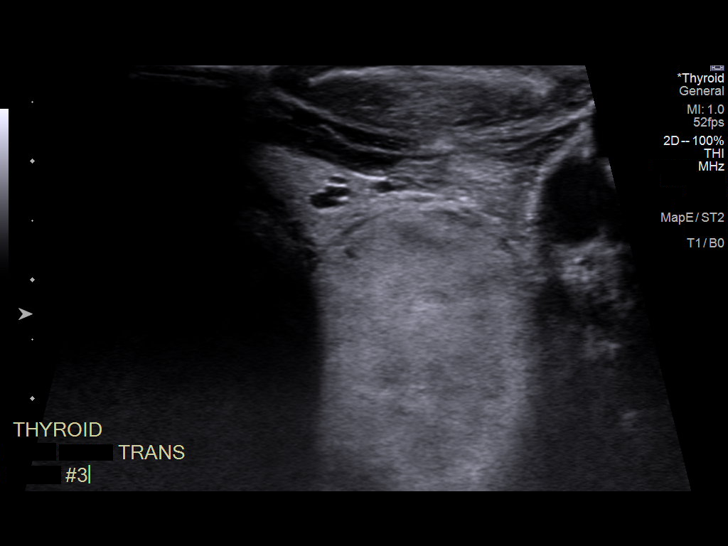
[im 13/16]
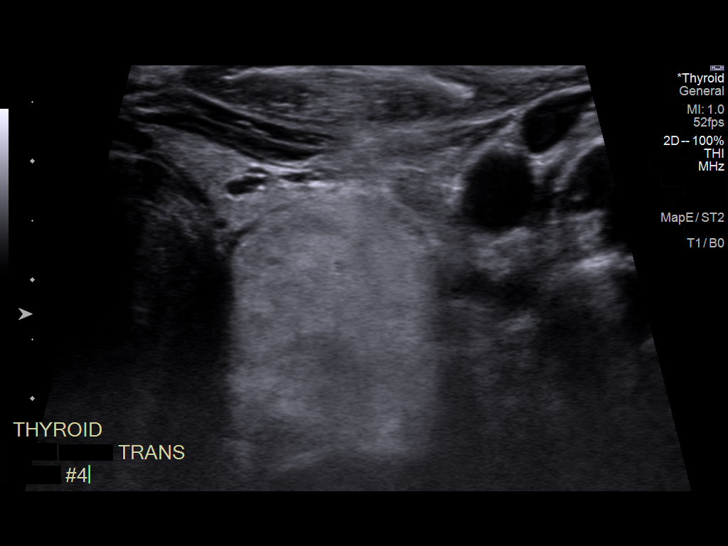
[im 15/16]
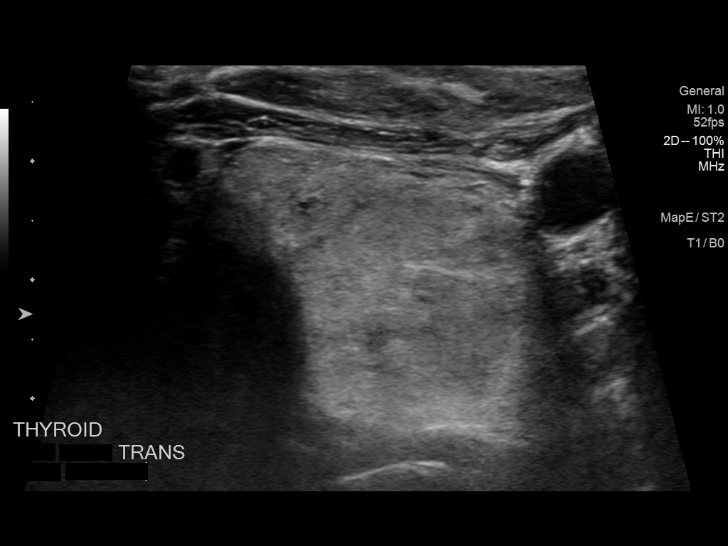
[im 16/16]
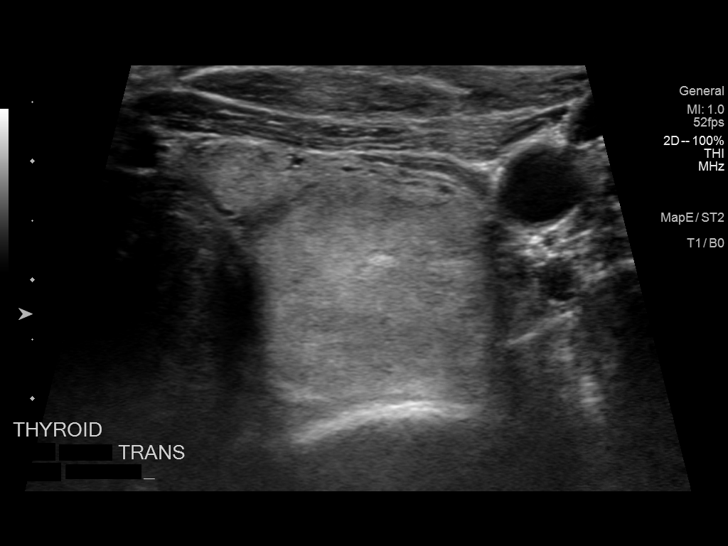

[13 of 16 positions shown; findings below may reference images not displayed]

Pre-procedural ultrasound scanning demonstrated unchanged size and
appearance of the indeterminate nodule within the left inferior lobe
of the thyroid.

The procedure was planned. The neck was prepped in the usual sterile
fashion, and a sterile drape was applied covering the operative
field. A timeout was performed prior to the initiation of the
procedure. Local anesthesia was provided with 1% lidocaine.

Under direct ultrasound guidance, 4 FNA biopsies were performed of
the left thyroid nodule with a 25 gauge needle. Multiple ultrasound
images were saved for procedural documentation purposes. The samples
were prepared and submitted to pathology.

Limited post procedural scanning was negative for hematoma or
additional complication. Dressings were placed. The patient
tolerated the above procedures procedure well without immediate
postprocedural complication.
FINDINGS: FINDINGS
Nodule reference number based on prior diagnostic ultrasound: 2

Maximum size: 2.9 cm

Location: Left  ;  Inferior

ACR TI-RADS risk category:  TR3

Reason for biopsy: meets ACR TI-RADS criteria

Ultrasound imaging confirms appropriate placement of the needles
within the thyroid nodule.
IMPRESSION: Technically successful ultrasound guided fine needle aspiration of
the left inferior thyroid nodule.

## 2021-01-22 ENCOUNTER — Other Ambulatory Visit: Payer: BC Managed Care – PPO

## 2021-03-27 ENCOUNTER — Encounter (HOSPITAL_COMMUNITY): Payer: Self-pay | Admitting: Emergency Medicine

## 2021-03-27 ENCOUNTER — Emergency Department (HOSPITAL_COMMUNITY)
Admission: EM | Admit: 2021-03-27 | Discharge: 2021-03-27 | Disposition: A | Discharge status: Home / Self Care | Payer: 59 | Attending: Emergency Medicine | Admitting: Emergency Medicine

## 2021-03-27 ENCOUNTER — Other Ambulatory Visit: Payer: Self-pay

## 2021-03-27 DIAGNOSIS — R000 Tachycardia, unspecified: Secondary | ICD-10-CM | POA: Insufficient documentation

## 2021-03-27 DIAGNOSIS — J449 Chronic obstructive pulmonary disease, unspecified: Secondary | ICD-10-CM | POA: Diagnosis not present

## 2021-03-27 DIAGNOSIS — Z87891 Personal history of nicotine dependence: Secondary | ICD-10-CM | POA: Insufficient documentation

## 2021-03-27 DIAGNOSIS — E039 Hypothyroidism, unspecified: Secondary | ICD-10-CM | POA: Insufficient documentation

## 2021-03-27 DIAGNOSIS — Z28310 Unvaccinated for covid-19: Secondary | ICD-10-CM | POA: Diagnosis not present

## 2021-03-27 DIAGNOSIS — R1084 Generalized abdominal pain: Secondary | ICD-10-CM | POA: Insufficient documentation

## 2021-03-27 DIAGNOSIS — Z7951 Long term (current) use of inhaled steroids: Secondary | ICD-10-CM | POA: Insufficient documentation

## 2021-03-27 DIAGNOSIS — R112 Nausea with vomiting, unspecified: Secondary | ICD-10-CM

## 2021-03-27 DIAGNOSIS — U071 COVID-19: Secondary | ICD-10-CM | POA: Diagnosis not present

## 2021-03-27 LAB — URINALYSIS, ROUTINE W REFLEX MICROSCOPIC
Bilirubin Urine: NEGATIVE
Glucose, UA: NEGATIVE mg/dL
Ketones, ur: 80 mg/dL — AB
Leukocytes,Ua: NEGATIVE
Nitrite: NEGATIVE
Protein, ur: 100 mg/dL — AB
Specific Gravity, Urine: 1.026 (ref 1.005–1.030)
pH: 5 (ref 5.0–8.0)

## 2021-03-27 LAB — CBC WITH DIFFERENTIAL/PLATELET
Abs Immature Granulocytes: 0.01 10*3/uL (ref 0.00–0.07)
Basophils Absolute: 0 10*3/uL (ref 0.0–0.1)
Basophils Relative: 0 %
Eosinophils Absolute: 0 10*3/uL (ref 0.0–0.5)
Eosinophils Relative: 0 %
HCT: 41.2 % (ref 36.0–46.0)
Hemoglobin: 12.9 g/dL (ref 12.0–15.0)
Immature Granulocytes: 0 %
Lymphocytes Relative: 31 %
Lymphs Abs: 1.7 10*3/uL (ref 0.7–4.0)
MCH: 26.1 pg (ref 26.0–34.0)
MCHC: 31.3 g/dL (ref 30.0–36.0)
MCV: 83.4 fL (ref 80.0–100.0)
Monocytes Absolute: 0.9 10*3/uL (ref 0.1–1.0)
Monocytes Relative: 15 %
Neutro Abs: 2.9 10*3/uL (ref 1.7–7.7)
Neutrophils Relative %: 54 %
Platelets: 144 10*3/uL — ABNORMAL LOW (ref 150–400)
RBC: 4.94 MIL/uL (ref 3.87–5.11)
RDW: 11.7 % (ref 11.5–15.5)
WBC: 5.5 10*3/uL (ref 4.0–10.5)
nRBC: 0 % (ref 0.0–0.2)

## 2021-03-27 LAB — COMPREHENSIVE METABOLIC PANEL
ALT: 18 U/L (ref 0–44)
AST: 27 U/L (ref 15–41)
Albumin: 3.8 g/dL (ref 3.5–5.0)
Alkaline Phosphatase: 45 U/L (ref 38–126)
Anion gap: 10 (ref 5–15)
BUN: 21 mg/dL — ABNORMAL HIGH (ref 6–20)
CO2: 24 mmol/L (ref 22–32)
Calcium: 9.1 mg/dL (ref 8.9–10.3)
Chloride: 100 mmol/L (ref 98–111)
Creatinine, Ser: 0.96 mg/dL (ref 0.44–1.00)
GFR, Estimated: >60 mL/min (ref >60)
Glucose, Bld: 80 mg/dL (ref 70–99)
Potassium: 4.1 mmol/L (ref 3.5–5.1)
Sodium: 134 mmol/L — ABNORMAL LOW (ref 135–145)
Total Bilirubin: 1.1 mg/dL (ref 0.3–1.2)
Total Protein: 7.1 g/dL (ref 6.5–8.1)

## 2021-03-27 LAB — RESP PANEL BY RT-PCR (FLU A&B, COVID) ARPGX2
Influenza A by PCR: NEGATIVE
Influenza B by PCR: NEGATIVE
SARS Coronavirus 2 by RT PCR: POSITIVE — AB

## 2021-03-27 LAB — MAGNESIUM: Magnesium: 2.2 mg/dL (ref 1.7–2.4)

## 2021-03-27 LAB — LIPASE, BLOOD: Lipase: 26 U/L (ref 11–51)

## 2021-03-27 LAB — PREGNANCY, URINE: Preg Test, Ur: NEGATIVE

## 2021-03-27 MED ORDER — SODIUM CHLORIDE 0.9 % IV BOLUS
500.0000 mL | Freq: Once | INTRAVENOUS | Status: AC
  Administered 2021-03-27: 500 mL via INTRAVENOUS

## 2021-03-27 MED ORDER — SODIUM CHLORIDE 0.9 % IV BOLUS
1000.0000 mL | Freq: Once | INTRAVENOUS | Status: AC
  Administered 2021-03-27: 1000 mL via INTRAVENOUS

## 2021-03-27 MED ORDER — MORPHINE SULFATE (PF) 2 MG/ML IV SOLN
2.0000 mg | Freq: Once | INTRAVENOUS | Status: AC
  Administered 2021-03-27: 2 mg via INTRAVENOUS
  Filled 2021-03-27: qty 1

## 2021-03-27 MED ORDER — METHOCARBAMOL 500 MG PO TABS: 500.0000 mg | ORAL_TABLET | Freq: Two times a day (BID) | ORAL | 0 refills | Status: DC

## 2021-03-27 MED ORDER — ONDANSETRON HCL 4 MG/2ML IJ SOLN
4.0000 mg | Freq: Once | INTRAMUSCULAR | Status: AC
  Administered 2021-03-27: 4 mg via INTRAVENOUS
  Filled 2021-03-27: qty 2

## 2021-03-27 MED ORDER — ACETAMINOPHEN 500 MG PO TABS: 1000.0000 mg | ORAL_TABLET | Freq: Four times a day (QID) | ORAL | 0 refills | Status: DC | PRN

## 2021-03-27 MED ORDER — ONDANSETRON 4 MG PO TBDP: 4.0000 mg | ORAL_TABLET | Freq: Three times a day (TID) | ORAL | 0 refills | Status: DC | PRN

## 2021-03-27 MED ORDER — IBUPROFEN 800 MG PO TABS: 800.0000 mg | ORAL_TABLET | Freq: Four times a day (QID) | ORAL | 0 refills | Status: DC | PRN

## 2021-03-27 MED ORDER — FAMOTIDINE IN NACL 20-0.9 MG/50ML-% IV SOLN
20.0000 mg | Freq: Once | INTRAVENOUS | Status: AC
  Administered 2021-03-27: 20 mg via INTRAVENOUS
  Filled 2021-03-27: qty 50

## 2021-03-27 MED ORDER — DICYCLOMINE HCL 20 MG PO TABS: 20.0000 mg | ORAL_TABLET | Freq: Two times a day (BID) | ORAL | 0 refills | Status: DC

## 2021-03-27 NOTE — ED Triage Notes (Signed)
Pt c/o abdominal pain, nausea/vomiting/diarrhea and generalized weakness x 1 week. Denies urinary symptoms.

## 2021-03-27 NOTE — Discharge Instructions (Addendum)
Your COVID test is pending; you should expect results within 24 hours. You can access your results on your MyChart--if you test positive you should receive a phone call.  In the meantime follow CDC guidelines and quarantine, wear a mask, wash hands often.   Please take over the counter vitamin D 2000-4000 units per day. I also recommend zinc 50 mg per day for the next two weeks.   Please return to ED if you feel have difficulty breathing or have emergent, new or concerning symptoms.  Patients who have symptoms consistent with COVID-19 should self isolated for: At least 3 days (72 hours) have passed since recovery, defined as resolution of fever without the use of fever reducing medications and improvement in respiratory symptoms (e.g., cough, shortness of breath), and At least 7 days have passed since symptoms first appeared.       Person Under Monitoring Name: Elaine Gibson  Location: [address] [place] [address]   Infection Prevention Recommendations for Individuals Confirmed to have, or Being Evaluated for, 2019 Novel Coronavirus (COVID-19) Infection Who Receive Care at Home  Individuals who are confirmed to have, or are being evaluated for, COVID-19 should follow the prevention steps below until a healthcare provider or local or state health department says they can return to normal activities.  Stay home except to get medical care You should restrict activities outside your home, except for getting medical care. Do not go to work, school, or public areas, and do not use public transportation or taxis.  Call ahead before visiting your doctor Before your medical appointment, call the healthcare provider and tell them that you have, or are being evaluated for, COVID-19 infection. This will help the healthcare provider's office take steps to keep other people from getting infected. Ask your healthcare provider to call the local or state health  department.  Monitor your symptoms Seek prompt medical attention if your illness is worsening (e.g., difficulty breathing). Before going to your medical appointment, call the healthcare provider and tell them that you have, or are being evaluated for, COVID-19 infection. Ask your healthcare provider to call the local or state health department.  Wear a facemask You should wear a facemask that covers your nose and mouth when you are in the same room with other people and when you visit a healthcare provider. People who live with or visit you should also wear a facemask while they are in the same room with you.  Separate yourself from other people in your home As much as possible, you should stay in a different room from other people in your home. Also, you should use a separate bathroom, if available.  Avoid sharing household items You should not share dishes, drinking glasses, cups, eating utensils, towels, bedding, or other items with other people in your home. After using these items, you should wash them thoroughly with soap and water.  Cover your coughs and sneezes Cover your mouth and nose with a tissue when you cough or sneeze, or you can cough or sneeze into your sleeve. Throw used tissues in a lined trash can, and immediately wash your hands with soap and water for at least 20 seconds or use an alcohol-based hand rub.  Wash your [facility] your hands often and thoroughly with soap and water for at least 20 seconds. You can use an alcohol-based hand sanitizer if soap and water are not available and if your hands are not visibly dirty. Avoid touching your eyes,  nose, and mouth with unwashed hands.   Prevention Steps for Caregivers and Household Members of Individuals Confirmed to have, or Being Evaluated for, COVID-19 Infection Being Cared for in the Home  If you live with, or provide care at home for, a person confirmed to have, or being evaluated for, COVID-19  infection please follow these guidelines to prevent infection:  Follow healthcare provider's instructions Make sure that you understand and can help the patient follow any healthcare provider instructions for all care.  Provide for the patient's basic needs You should help the patient with basic needs in the home and provide support for getting groceries, prescriptions, and other personal needs.  Monitor the patient's symptoms If they are getting sicker, call his or her medical provider and tell them that the patient has, or is being evaluated for, COVID-19 infection. This will help the healthcare provider's office take steps to keep other people from getting infected. Ask the healthcare provider to call the local or state health department.  Limit the number of people who have contact with the patient If possible, have only one caregiver for the patient. Other household members should stay in another home or place of residence. If this is not possible, they should stay in another room, or be separated from the patient as much as possible. Use a separate bathroom, if available. Restrict visitors who do not have an essential need to be in the home.  Keep older adults, very young children, and other sick people away from the patient Keep older adults, very young children, and those who have compromised immune systems or chronic health conditions away from the patient. This includes people with chronic heart, lung, or kidney conditions, diabetes, and cancer.  Ensure good ventilation Make sure that shared spaces in the home have good air flow, such as from an air conditioner or an opened window, weather permitting.  Wash your hands often Wash your hands often and thoroughly with soap and water for at least 20 seconds. You can use an alcohol based hand sanitizer if soap and water are not available and if your hands are not visibly dirty. Avoid touching your eyes, nose, and mouth with  unwashed hands. Use disposable paper towels to dry your hands. If not available, use dedicated cloth towels and replace them when they become wet.  Wear a facemask and gloves Wear a disposable facemask at all times in the room and gloves when you touch or have contact with the patient's blood, body fluids, and/or secretions or excretions, such as sweat, saliva, sputum, nasal mucus, vomit, urine, or feces.  Ensure the mask fits over your nose and mouth tightly, and do not touch it during use. Throw out disposable facemasks and gloves after using them. Do not reuse. Wash your hands immediately after removing your facemask and gloves. If your personal clothing becomes contaminated, carefully remove clothing and launder. Wash your hands after handling contaminated clothing. Place all used disposable facemasks, gloves, and other waste in a lined container before disposing them with other household waste. Remove gloves and wash your hands immediately after handling these items.  Do not share dishes, glasses, or other household items with the patient Avoid sharing household items. You should not share dishes, drinking glasses, cups, eating utensils, towels, bedding, or other items with a patient who is confirmed to have, or being evaluated for, COVID-19 infection. After the person uses these items, you should wash them thoroughly with soap and water.  Wash laundry thoroughly Immediately remove  and wash clothes or bedding that have blood, body fluids, and/or secretions or excretions, such as sweat, saliva, sputum, nasal mucus, vomit, urine, or feces, on them. Wear gloves when handling laundry from the patient. Read and follow directions on labels of laundry or clothing items and detergent. In general, wash and dry with the warmest temperatures recommended on the label.  Clean all areas the individual has used often Clean all touchable surfaces, such as counters, tabletops, doorknobs, bathroom fixtures,  toilets, phones, keyboards, tablets, and bedside tables, every day. Also, clean any surfaces that may have blood, body fluids, and/or secretions or excretions on them. Wear gloves when cleaning surfaces the patient has come in contact with. Use a diluted bleach solution (e.g., dilute bleach with 1 part bleach and 10 parts water) or a household disinfectant with a label that says EPA-registered for coronaviruses. To make a bleach solution at home, add 1 tablespoon of bleach to 1 quart (4 cups) of water. For a larger supply, add  cup of bleach to 1 gallon (16 cups) of water. Read labels of cleaning products and follow recommendations provided on product labels. Labels contain instructions for safe and effective use of the cleaning product including precautions you should take when applying the product, such as wearing gloves or eye protection and making sure you have good ventilation during use of the product. Remove gloves and wash hands immediately after cleaning.  Monitor yourself for signs and symptoms of illness Caregivers and household members are considered close contacts, should monitor their health, and will be asked to limit movement outside of the home to the extent possible. Follow the monitoring steps for close contacts listed on the symptom monitoring form.   ? If you have additional questions, contact your local health department or call the epidemiologist on call at [phone] (available 24/7). ? This guidance is subject to change. For the most up-to-date guidance from [facility], please refer to their website: [email or web address]

## 2021-03-27 NOTE — ED Provider Notes (Signed)
[facility] EMERGENCY DEPARTMENT Provider Note   CSN: [id number] Arrival date & time: 03/27/21  1631     History Chief Complaint  Patient presents with  . Emesis    Elaine Gibson is a 45 y.o. female.  HPI Patient is a 45 year old female with a past medical history significant for alpha-1 antitrypsin deficiency with emphysema of her lungs no longer smoker,  hypothyroidism, scoliosis   Patient states that she has nausea vomiting diarrhea also has some generalized abdominal pain the symptoms been ongoing and persistent since Monday.  She denies any fevers or chills.  No recent antibiotic use or recent travel no known sick contacts.  She is not vaccinated against COVID-19.  She states that she has no urinary frequency urgency dysuria or hematuria that she knows of.  States she has been doing her best to stay hydrated however she has vomiting even which is water.  She states that she was seen Tuesday or Wednesday of this week and was prescribed Zofran.  She states that this helped some but she has not been able to keep much food or water down.  She denies any chest pain or shortness of breath no cough congestion rhinorrhea or sore throat.  Denies any other associate symptoms.  She is taken no medications other than Zofran.  Which has mild improvement of her symptoms.    Past Medical History:  Diagnosis Date  . Emphysema lung ([place])   . Emphysema of lung ([place])   . Hypothyroidism    tumors   . Scoliosis     Patient Active Problem List   Diagnosis Date Noted  . COPD (chronic obstructive pulmonary disease) ([place]) 07/18/2019  . Protein calorie malnutrition ([place]) 07/18/2019  . Pulmonary nodule 07/18/2019  . ASCUS with positive high risk HPV cervical 12/27/2018    Past Surgical History:  Procedure Laterality Date  . BACK SURGERY    . CESAREAN SECTION       OB History    Gravida  4   Para  2   Term  2   Preterm  0   AB  2   Living  2     SAB   1   IAB  0   Ectopic  0   Multiple  0   Live Births  2           Family History  Problem Relation Age of Onset  . Hypertension Mother   . Kidney disease Mother   . Stomach cancer Father   . Gestational diabetes Sister     Social History   Tobacco Use  . Smoking status: Former Smoker    Years: 33.00    Types: Cigars    Start date: 81    Quit date: 05/04/2019    Years since quitting: 1.8  . Smokeless tobacco: Never Used  . Tobacco comment: 3 black and milds a day   Vaping Use  . Vaping Use: Never used  Substance Use Topics  . Alcohol use: No  . Drug use: Never    Home Medications Prior to Admission medications   Medication Sig Start Date End Date Taking? Authorizing Provider  acetaminophen (TYLENOL) 500 MG tablet Take 2 tablets (1,000 mg total) by mouth every 6 (six) hours as needed. 03/27/21  Yes Kyler Germer S, PA  albuterol (PROVENTIL HFA;VENTOLIN HFA) 108 (90 Base) MCG/ACT inhaler Inhale 2 puffs into the lungs daily. 12/13/18  Yes Lajean Manes, CNM  dicyclomine (BENTYL) 20  MG tablet Take 1 tablet (20 mg total) by mouth 2 (two) times daily. 03/27/21  Yes Maximiano Lott S, PA  Fluticasone-Umeclidin-Vilant (TRELEGY ELLIPTA) 100-62.5-25 MCG/INH AEPB Inhale 1 puff into the lungs daily. 11/07/19  Yes Margaretha Seeds, MD  ibuprofen (ADVIL) 800 MG tablet Take 1 tablet (800 mg total) by mouth every 6 (six) hours as needed. 03/27/21  Yes Onyx Edgley S, PA  methocarbamol (ROBAXIN) 500 MG tablet Take 1 tablet (500 mg total) by mouth 2 (two) times daily. 03/27/21  Yes Alim Cattell S, PA  ondansetron (ZOFRAN ODT) 4 MG disintegrating tablet Take 1 tablet (4 mg total) by mouth every 8 (eight) hours as needed for nausea or vomiting. 03/27/21  Yes Ridhaan Dreibelbis S, PA  Fluticasone-Umeclidin-Vilant (TRELEGY ELLIPTA) 100-62.5-25 MCG/INH AEPB Inhale 1 puff into the lungs daily. Patient not taking: No sig reported 11/07/19   Margaretha Seeds, MD    Allergies    Patient  has no known allergies.  Review of Systems   Review of Systems  Constitutional: Positive for chills and fatigue. Negative for fever.  HENT: Negative for congestion.   Eyes: Negative for pain.  Respiratory: Negative for cough and shortness of breath.   Cardiovascular: Negative for chest pain and leg swelling.  Gastrointestinal: Positive for abdominal pain, diarrhea, nausea and vomiting.  Genitourinary: Negative for dysuria.  Musculoskeletal: Negative for myalgias.  Skin: Negative for rash.  Neurological: Negative for dizziness and headaches.    Physical Exam Updated Vital Signs BP 91/61   Pulse 72   Temp 98.3 F (36.8 C) (Oral)   Resp 15   Ht 5\' 5"  (1.651 m)   Wt 40.8 kg   SpO2 95%   BMI 14.98 kg/m   Physical Exam Vitals and nursing note reviewed.  Constitutional:      General: She is not in acute distress.    Comments: Cachectic 74 female   HENT:     Head: Normocephalic and atraumatic.     Nose: Nose normal.     Mouth/Throat:     Mouth: Mucous membranes are dry.  Eyes:     General: No scleral icterus. Cardiovascular:     Rate and Rhythm: Regular rhythm. Tachycardia present.     Pulses: Normal pulses.     Heart sounds: Normal heart sounds.  Pulmonary:     Effort: Pulmonary effort is normal. No respiratory distress.     Breath sounds: No wheezing.  Abdominal:     Palpations: Abdomen is soft.     Tenderness: There is abdominal tenderness. There is no right CVA tenderness, left CVA tenderness, guarding or rebound.     Comments: Mild generalized TTP no focal TTP Negative Murphy sign. Well-healed abdominal surgical scar from C-section x2  Musculoskeletal:     Cervical back: Normal range of motion.     Right lower leg: No edema.     Left lower leg: No edema.  Skin:    General: Skin is warm and dry.     Capillary Refill: Capillary refill takes less than 2 seconds.  Neurological:     Mental Status: She is alert. Mental status is at baseline.  Psychiatric:         Mood and Affect: Mood normal.        Behavior: Behavior normal.     ED Results / Procedures / Treatments   Labs (all labs ordered are listed, but only abnormal results are displayed) Labs Reviewed  RESP PANEL BY RT-PCR (FLU A&B, COVID) ARPGX2 -  Abnormal; Notable for the following components:      Result Value   SARS Coronavirus 2 by RT PCR POSITIVE (*)    All other components within normal limits  CBC WITH DIFFERENTIAL/PLATELET - Abnormal; Notable for the following components:   Platelets 144 (*)    All other components within normal limits  COMPREHENSIVE METABOLIC PANEL - Abnormal; Notable for the following components:   Sodium 134 (*)    BUN 21 (*)    All other components within normal limits  URINALYSIS, ROUTINE W REFLEX MICROSCOPIC - Abnormal; Notable for the following components:   APPearance HAZY (*)    Hgb urine dipstick MODERATE (*)    Ketones, ur 80 (*)    Protein, ur 100 (*)    Bacteria, UA RARE (*)    All other components within normal limits  LIPASE, BLOOD  MAGNESIUM  PREGNANCY, URINE    EKG None  Radiology No results found.  Procedures Procedures   Medications Ordered in ED Medications  sodium chloride 0.9 % bolus 1,000 mL (0 mLs Intravenous Stopped 03/27/21 1951)  ondansetron (ZOFRAN) injection 4 mg (4 mg Intravenous Given 03/27/21 1732)  morphine 2 MG/ML injection 2 mg (2 mg Intravenous Given 03/27/21 1734)  famotidine (PEPCID) IVPB 20 mg premix (0 mg Intravenous Stopped 03/27/21 1919)  sodium chloride 0.9 % bolus 500 mL (500 mLs Intravenous New Bag/Given 03/27/21 1955)    ED Course  I have reviewed the triage vital signs and the nursing notes.  Pertinent labs & imaging results that were available during my care of the patient were reviewed by me and considered in my medical decision making (see chart for details).  Clinical Course as of 03/27/21 2240  Sat Mar 27, 2021  2239 SARS Coronavirus 2 by RT PCR(!): POSITIVE [WF]  2239 Preg Test, Ur:  NEGATIVE [WF]    Clinical Course User Index [WF] Tedd Sias, [place]   MDM Rules/Calculators/A&P                          Questionable 45 year old female with past medical history detailed in HPI presented today for nausea vomiting diarrhea and generalized abdominal pain ongoing since Monday.  She has no red flag presentations including no recent antibiotics, no fevers, her stool is loose but not watery.  She is mildly tachycardic with dry oral mucosa and generalized abdominal tenderness.  Suspect viral process such as gastroenteritis.  She has had bowel movements.  For SBO LBO unlikely.  No dark or tarry stool or melena or hematochezia.  Low suspicion for ischemic colitis.  Her pain is 9 out of proportion with examination.  She is unvaccinated against COVID-19.  I do have some suspicion for this although she is not having rhinorrhea cough congestion or shortness of breath.  Overall anticipate rehydration COVID screening and discharged with antiemetic and Bentyl. Elaine Gibson was evaluated in Emergency Department on 03/27/2021 for the symptoms described in the history of present illness. She was evaluated in the context of the global COVID-19 pandemic, which necessitated consideration that the patient might be at risk for infection with the SARS-CoV-2 virus that causes COVID-19. Institutional protocols and algorithms that pertain to the evaluation of patients at risk for COVID-19 are in a state of rapid change based on information released by regulatory bodies including the CDC and federal and state organizations. These policies and algorithms were followed during the patient's care in the ED.   CBC without leukocytosis  or anemia.  CMP unremarkable.  Urinalysis consistent with significant dehydration with ketones and protein present.  She will need to have this rechecked with PCP.  Lipase within normal limits.  Magnesium within normal limits.  Influenza AMB negative COVID test  positive.  Patient tolerating p.o.  Feels much improved after Pepcid, fluids, Zofran.  Ambulates at baseline.  Discharged with ambulatory referral to [facility] treatment center.  Patient discharged with muscle relaxer Tylenol ibuprofen Bentyl and Zofran.  Return precautions given.  She is understanding of plan and feeling well at this time.  Final Clinical Impression(s) / ED Diagnoses Final diagnoses:  COVID-19  Non-intractable vomiting with nausea, unspecified vomiting type    Rx / DC Orders ED Discharge Orders         Ordered    Ambulatory referral for Covid Treatment        03/27/21 2057    ondansetron (ZOFRAN ODT) 4 MG disintegrating tablet  Every 8 hours PRN        03/27/21 2058    methocarbamol (ROBAXIN) 500 MG tablet  2 times daily        03/27/21 2058    ibuprofen (ADVIL) 800 MG tablet  Every 6 hours PRN        03/27/21 2058    acetaminophen (TYLENOL) 500 MG tablet  Every 6 hours PRN        03/27/21 2058    dicyclomine (BENTYL) 20 MG tablet  2 times daily        03/27/21 2238           Pati Gallo [place], [place] 03/27/21 [place]    Isla Pence, MD 03/28/21 1123

## 2021-03-28 ENCOUNTER — Telehealth: Payer: Self-pay | Admitting: Nurse Practitioner

## 2021-03-28 NOTE — Telephone Encounter (Signed)
Called to discuss with patient about COVID-19 symptoms and the use of one of the available treatments for those with mild to moderate Covid symptoms and at a high risk of hospitalization.  Pt appears to qualify for outpatient treatment due to co-morbid conditions and/or a member of an at-risk group in accordance with the FDA Emergency Use Authorization.    Symptom onset: 03/26/21 Vaccinated: no Booster? no Immunocompromised? no Qualifiers: alpha-1,COPD NIH Criteria: 2  Unable to reach pt - Voicemail left.   Alda Lea, NP [facility] [phone]

## 2021-07-05 ENCOUNTER — Other Ambulatory Visit: Payer: Self-pay | Admitting: Occupational Medicine

## 2021-07-05 ENCOUNTER — Ambulatory Visit: Payer: Self-pay

## 2021-07-05 ENCOUNTER — Other Ambulatory Visit: Payer: Self-pay

## 2021-07-05 DIAGNOSIS — Z0000 Encounter for general adult medical examination without abnormal findings: Secondary | ICD-10-CM

## 2021-10-23 IMAGING — CT CT CHEST W/O CM
2 of 4 series · 15 of 36 positions shown, 18 images · non-contrast
Comparison: 01/07/2019

CLINICAL DATA: Follow-up lung nodule

EXAM:
CT CHEST WITHOUT CONTRAST
TECHNIQUE: Multidetector CT imaging of the chest was performed following the
standard protocol without IV contrast.

[Series 2: thorax · axial · 0.54mm/px · z∈[-373,-65]mm · 12 of 182 slices shown, 15 images]
[im 14/182  mediastinal]
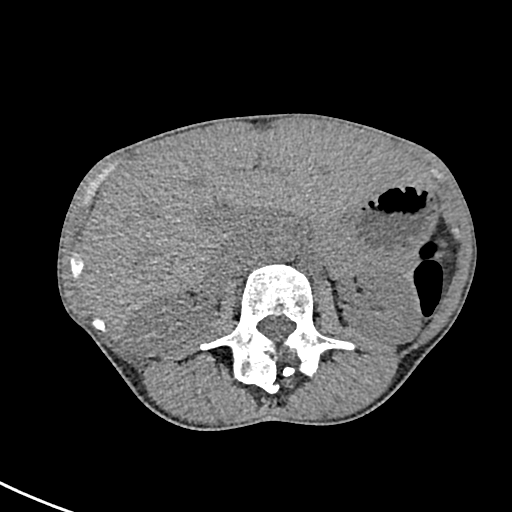
[im 14/182  lung]
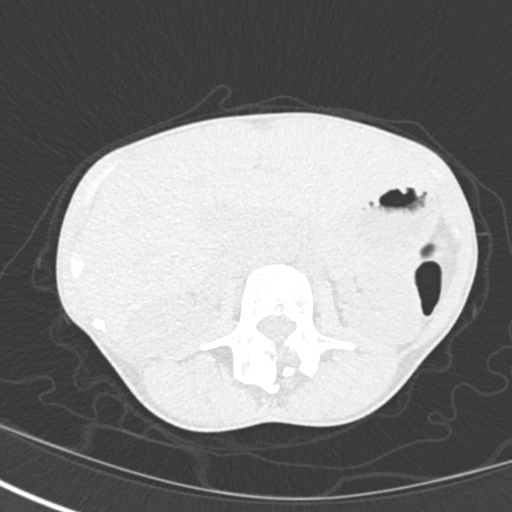
[im 28/182  lung]
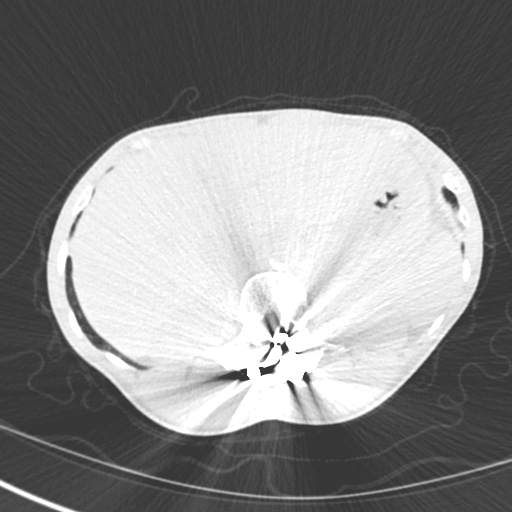
[im 42/182  lung]
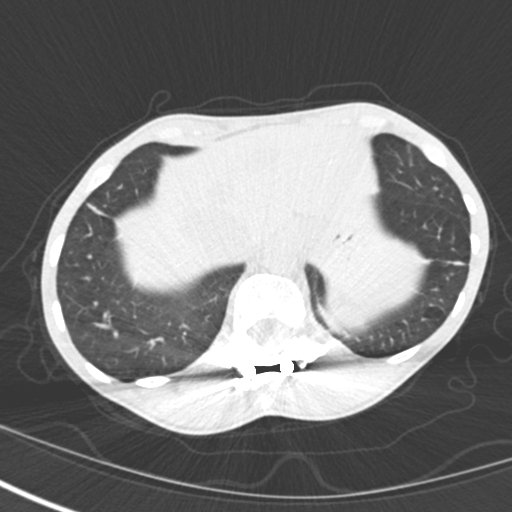
[im 56/182  lung]
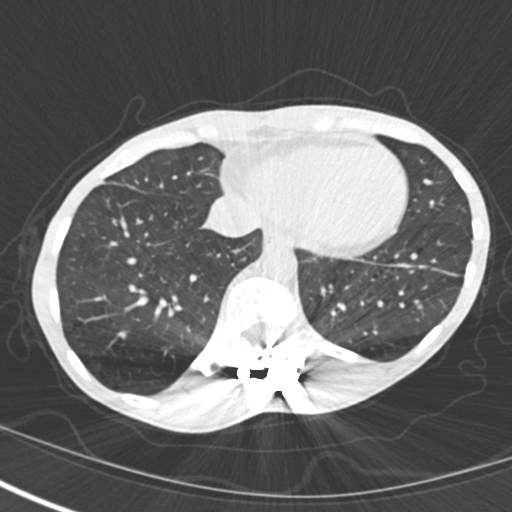
[im 70/182  mediastinal]
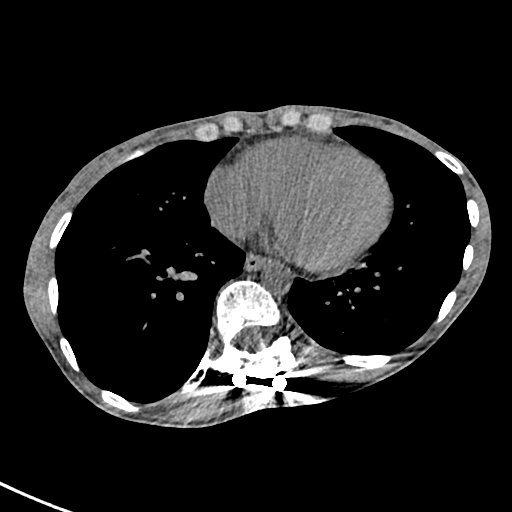
[im 70/182  lung]
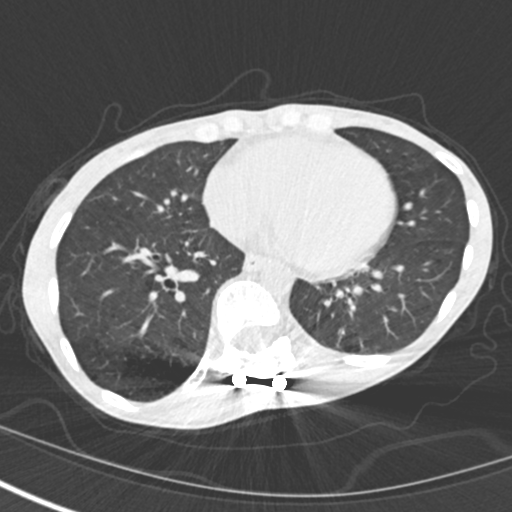
[im 84/182  lung]
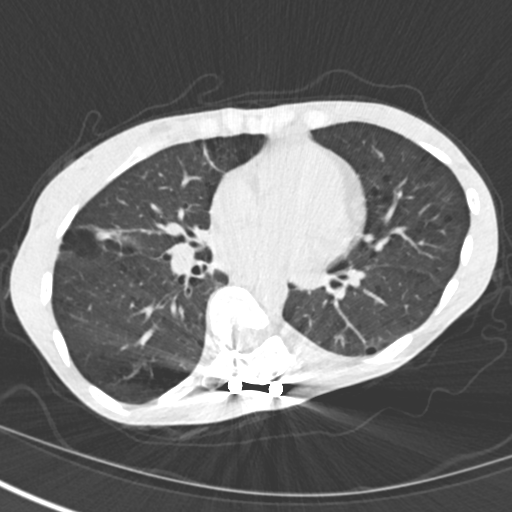
[im 98/182  lung]
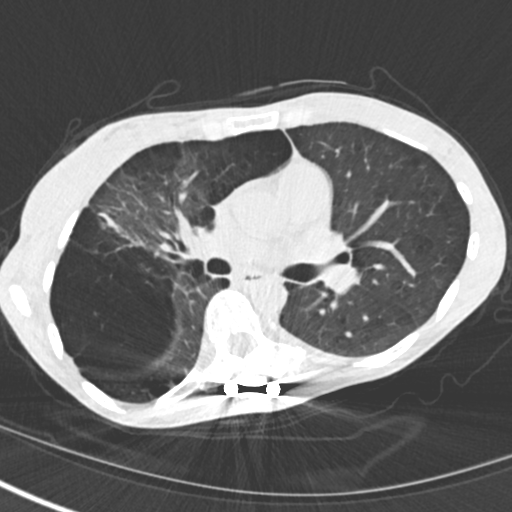
[im 112/182  lung]
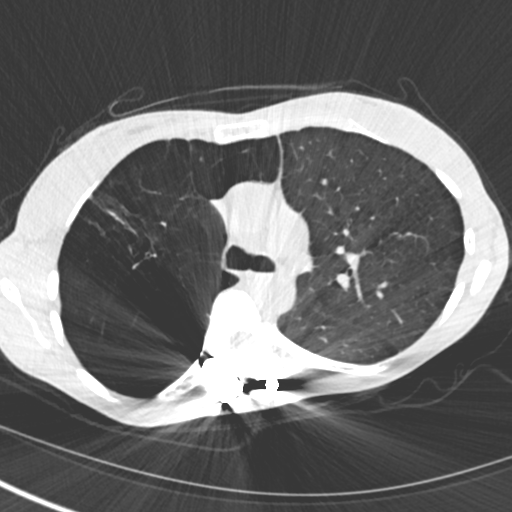
[im 126/182  mediastinal]
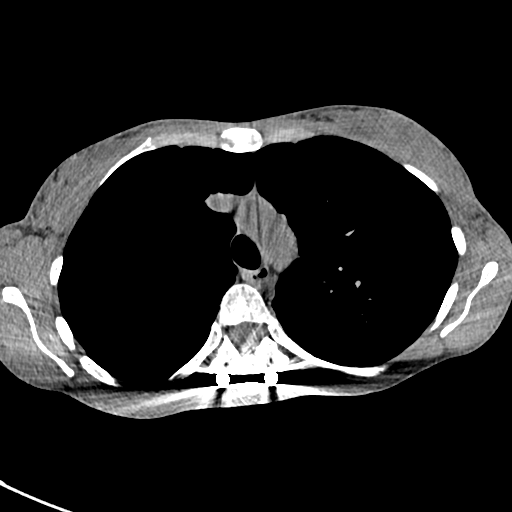
[im 126/182  lung]
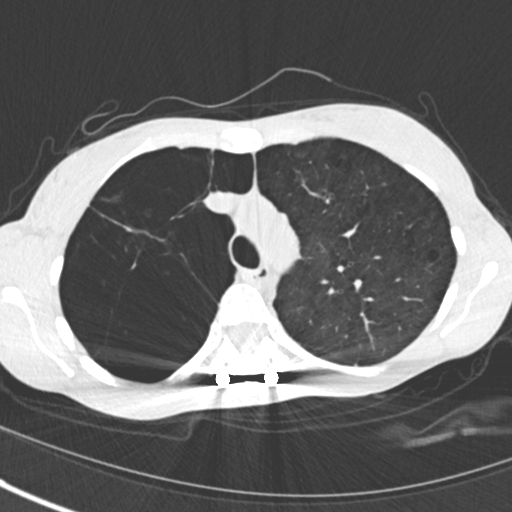
[im 140/182  lung]
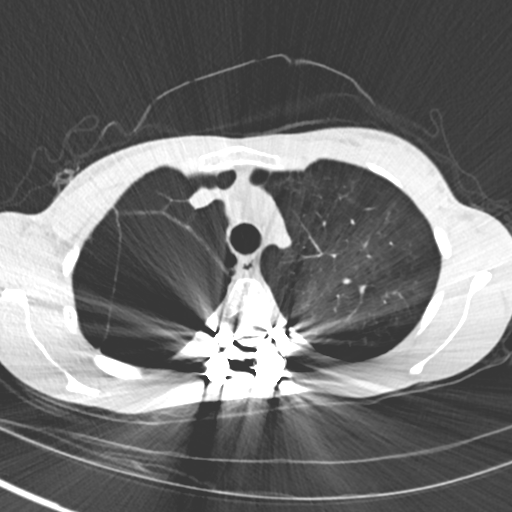
[im 154/182  lung]
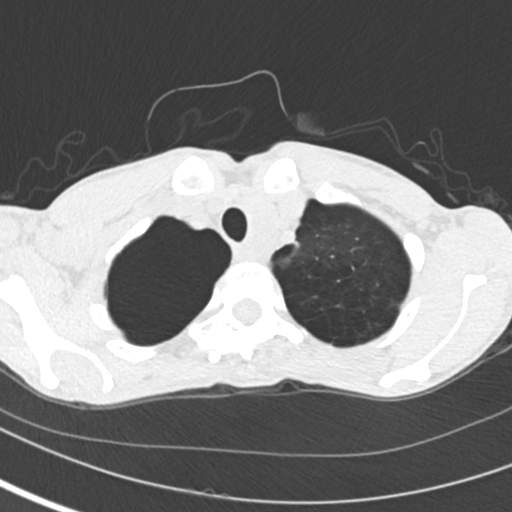
[im 168/182  lung]
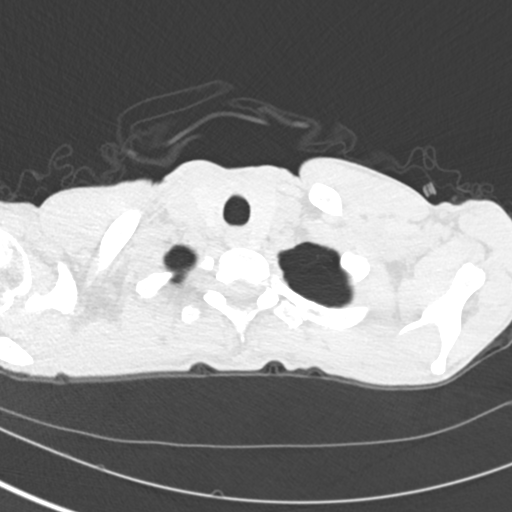

[Series 5: coronal · coronal · 0.56mm/px · 3 of 90 slices shown]
[im 18/90  lung]
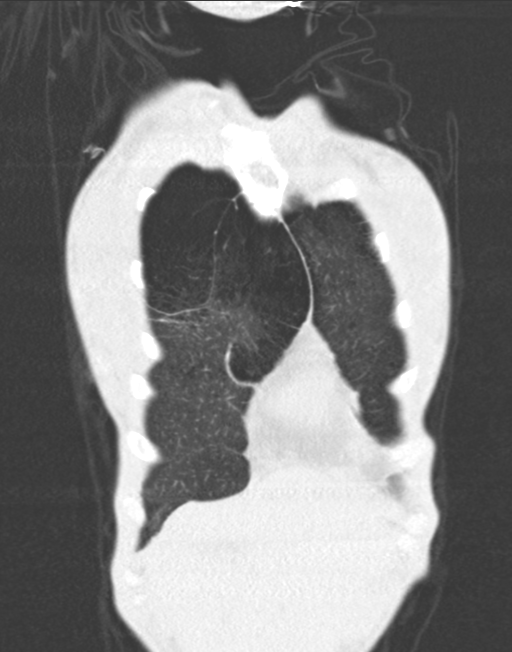
[im 36/90  lung]
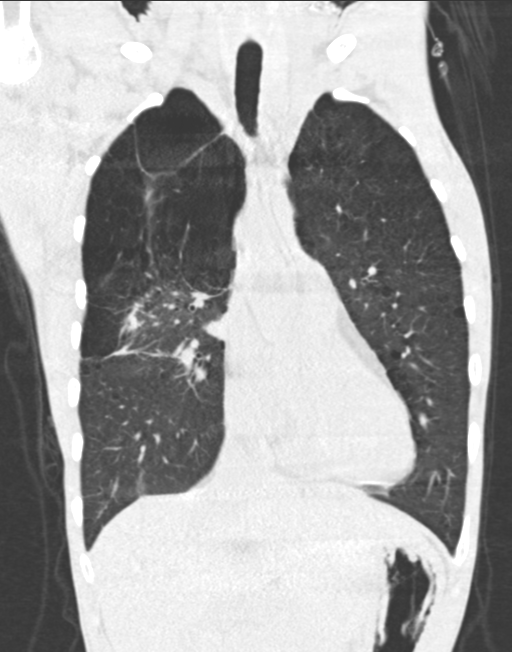
[im 54/90  lung]
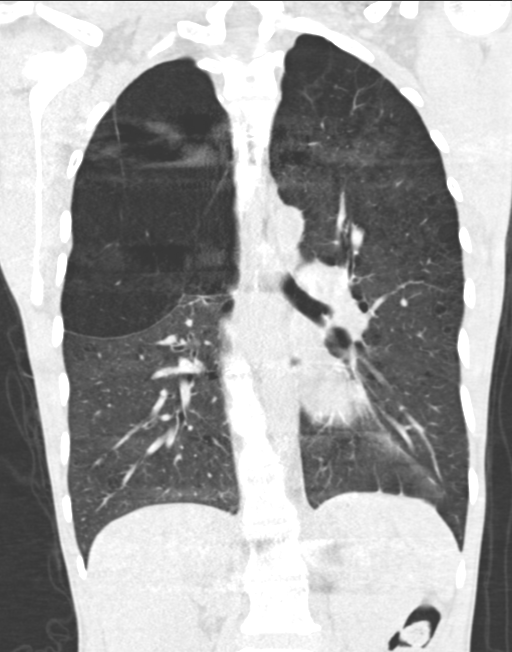

[15 of 36 positions shown; findings below may reference images not displayed]

FINDINGS: Cardiovascular: No significant vascular findings. Normal heart size.
No pericardial effusion.

Mediastinum/Nodes: No enlarged mediastinal, hilar, or axillary lymph
nodes. Large nodule of the inferior pole of the left lobe of the
thyroid, previously biopsied. Trachea, and esophagus demonstrate no
significant findings.

Lungs/Pleura: Severe emphysema, with very large bullae of the right
upper lobe. Stable, benign 5 mm and 4 mm pulmonary nodules of the
right lower lobe (series 3, image 121, 124). Additional stable,
benign 4 mm nodule of the left lower lobe (series 3, image 124). No
pleural effusion or pneumothorax.

Upper Abdomen: No acute abnormality.

Musculoskeletal: No chest wall mass or suspicious bone lesions
identified. Extensive posterior thoracolumbar fusion.
IMPRESSION: 1. Severe emphysema, with very large bullae of the right upper lobe.
Emphysema ([id number]).
2. Stable, benign small nodules of the right lower lobe and left
lower lobe. No further routine CT follow-up is required for these
nodules. Consider ongoing annual CT lung cancer screening if
indicated by patient age, smoking history, and/or other risk factors
for lung cancer
3. Large nodule of the inferior pole of the left lobe of the
thyroid, previously biopsied.

## 2022-07-26 ENCOUNTER — Emergency Department (HOSPITAL_COMMUNITY): Payer: BC Managed Care – PPO

## 2022-07-26 ENCOUNTER — Encounter (HOSPITAL_COMMUNITY): Payer: Self-pay | Admitting: Emergency Medicine

## 2022-07-26 ENCOUNTER — Emergency Department (HOSPITAL_COMMUNITY)
Admission: EM | Admit: 2022-07-26 | Discharge: 2022-07-26 | Disposition: A | Discharge status: Home / Self Care | Payer: BC Managed Care – PPO | Attending: Emergency Medicine | Admitting: Emergency Medicine

## 2022-07-26 ENCOUNTER — Other Ambulatory Visit: Payer: Self-pay

## 2022-07-26 DIAGNOSIS — J449 Chronic obstructive pulmonary disease, unspecified: Secondary | ICD-10-CM | POA: Insufficient documentation

## 2022-07-26 DIAGNOSIS — M545 Low back pain, unspecified: Secondary | ICD-10-CM | POA: Insufficient documentation

## 2022-07-26 DIAGNOSIS — Y92410 Unspecified street and highway as the place of occurrence of the external cause: Secondary | ICD-10-CM | POA: Insufficient documentation

## 2022-07-26 DIAGNOSIS — Z79899 Other long term (current) drug therapy: Secondary | ICD-10-CM | POA: Diagnosis not present

## 2022-07-26 LAB — PREGNANCY, URINE: Preg Test, Ur: NEGATIVE

## 2022-07-26 MED ORDER — METHOCARBAMOL 500 MG PO TABS: 500.0000 mg | ORAL_TABLET | Freq: Two times a day (BID) | ORAL | 0 refills | Status: DC

## 2022-07-26 MED ORDER — LIDOCAINE 5 % EX PTCH
1.0000 | MEDICATED_PATCH | CUTANEOUS | Status: DC
  Administered 2022-07-26: 1 via TRANSDERMAL
  Filled 2022-07-26: qty 1

## 2022-07-26 MED ORDER — KETOROLAC TROMETHAMINE 15 MG/ML IJ SOLN
15.0000 mg | Freq: Once | INTRAMUSCULAR | Status: AC
  Administered 2022-07-26: 15 mg via INTRAMUSCULAR
  Filled 2022-07-26: qty 1

## 2022-07-26 MED ORDER — NAPROXEN 500 MG PO TABS: 500.0000 mg | ORAL_TABLET | Freq: Two times a day (BID) | ORAL | 0 refills | Status: DC
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
## 2022-07-26 NOTE — Discharge Instructions (Addendum)
It was a pleasure taking care of you today.  As discussed, your CT scan showed a bulging disc.  I have included the number of the neurosurgeon.  Please call to schedule an appointment if symptoms do not improve over the next few weeks.  I am sending you home with a pain medication muscle relaxer.  Take as needed for pain.  Muscle relaxer can cause drowsiness so do not drive or operate machinery while on the medication.  I have included low back exercises.  Perform daily.  Low back pain can take up to 6 weeks to improve.  Please follow-up with PCP if symptoms not improve over the next few days.  Return to the ER for any worsening symptoms.  You may also purchase Lidoderm patches and Voltaren gel over-the-counter for symptomatic relief.
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
## 2022-08-08 ENCOUNTER — Other Ambulatory Visit: Payer: Self-pay

## 2022-08-09 ENCOUNTER — Emergency Department (HOSPITAL_COMMUNITY)
Admission: EM | Admit: 2022-08-09 | Discharge: 2022-08-09 | Discharge status: Against Medical Advice | Payer: BC Managed Care – PPO

## 2022-08-09 NOTE — ED Notes (Signed)
PT called multiple instances for triage with no response

## 2022-08-10 ENCOUNTER — Ambulatory Visit: Payer: 59 | Admitting: Adult Health

## 2022-10-31 ENCOUNTER — Other Ambulatory Visit: Payer: Self-pay

## 2022-10-31 ENCOUNTER — Inpatient Hospital Stay (HOSPITAL_COMMUNITY)
Admission: EM | Admit: 2022-10-31 | Discharge: 2022-11-01 | DRG: 194 | Disposition: A | Discharge status: Home / Self Care | Payer: BC Managed Care – PPO | Attending: Internal Medicine | Admitting: Internal Medicine

## 2022-10-31 ENCOUNTER — Encounter (HOSPITAL_COMMUNITY): Payer: Self-pay | Admitting: Internal Medicine

## 2022-10-31 ENCOUNTER — Emergency Department (HOSPITAL_COMMUNITY): Payer: BC Managed Care – PPO

## 2022-10-31 DIAGNOSIS — Z87891 Personal history of nicotine dependence: Secondary | ICD-10-CM

## 2022-10-31 DIAGNOSIS — C3412 Malignant neoplasm of upper lobe, left bronchus or lung: Secondary | ICD-10-CM | POA: Diagnosis present

## 2022-10-31 DIAGNOSIS — D75838 Other thrombocytosis: Secondary | ICD-10-CM | POA: Diagnosis present

## 2022-10-31 DIAGNOSIS — Z1152 Encounter for screening for COVID-19: Secondary | ICD-10-CM | POA: Diagnosis not present

## 2022-10-31 DIAGNOSIS — R64 Cachexia: Secondary | ICD-10-CM | POA: Diagnosis present

## 2022-10-31 DIAGNOSIS — E440 Moderate protein-calorie malnutrition: Secondary | ICD-10-CM | POA: Diagnosis present

## 2022-10-31 DIAGNOSIS — Z7951 Long term (current) use of inhaled steroids: Secondary | ICD-10-CM | POA: Diagnosis not present

## 2022-10-31 DIAGNOSIS — M419 Scoliosis, unspecified: Secondary | ICD-10-CM | POA: Diagnosis present

## 2022-10-31 DIAGNOSIS — E039 Hypothyroidism, unspecified: Secondary | ICD-10-CM | POA: Diagnosis present

## 2022-10-31 DIAGNOSIS — Z8249 Family history of ischemic heart disease and other diseases of the circulatory system: Secondary | ICD-10-CM

## 2022-10-31 DIAGNOSIS — R54 Age-related physical debility: Secondary | ICD-10-CM | POA: Diagnosis present

## 2022-10-31 DIAGNOSIS — Z681 Body mass index (BMI) 19 or less, adult: Secondary | ICD-10-CM

## 2022-10-31 DIAGNOSIS — R918 Other nonspecific abnormal finding of lung field: Secondary | ICD-10-CM | POA: Diagnosis not present

## 2022-10-31 DIAGNOSIS — J101 Influenza due to other identified influenza virus with other respiratory manifestations: Secondary | ICD-10-CM | POA: Diagnosis not present

## 2022-10-31 DIAGNOSIS — Z79899 Other long term (current) drug therapy: Secondary | ICD-10-CM

## 2022-10-31 DIAGNOSIS — Z800 Family history of malignant neoplasm of digestive organs: Secondary | ICD-10-CM

## 2022-10-31 DIAGNOSIS — D630 Anemia in neoplastic disease: Secondary | ICD-10-CM | POA: Diagnosis present

## 2022-10-31 DIAGNOSIS — E860 Dehydration: Secondary | ICD-10-CM | POA: Diagnosis present

## 2022-10-31 DIAGNOSIS — J449 Chronic obstructive pulmonary disease, unspecified: Secondary | ICD-10-CM | POA: Diagnosis present

## 2022-10-31 DIAGNOSIS — Z833 Family history of diabetes mellitus: Secondary | ICD-10-CM

## 2022-10-31 DIAGNOSIS — Z841 Family history of disorders of kidney and ureter: Secondary | ICD-10-CM

## 2022-10-31 DIAGNOSIS — J439 Emphysema, unspecified: Secondary | ICD-10-CM | POA: Diagnosis present

## 2022-10-31 DIAGNOSIS — D75839 Thrombocytosis, unspecified: Secondary | ICD-10-CM | POA: Diagnosis present

## 2022-10-31 DIAGNOSIS — E46 Unspecified protein-calorie malnutrition: Secondary | ICD-10-CM | POA: Diagnosis present

## 2022-10-31 DIAGNOSIS — J189 Pneumonia, unspecified organism: Secondary | ICD-10-CM

## 2022-10-31 DIAGNOSIS — D649 Anemia, unspecified: Secondary | ICD-10-CM | POA: Diagnosis present

## 2022-10-31 DIAGNOSIS — R110 Nausea: Secondary | ICD-10-CM | POA: Diagnosis not present

## 2022-10-31 LAB — COMPREHENSIVE METABOLIC PANEL
ALT: 17 U/L (ref 0–44)
AST: 21 U/L (ref 15–41)
Albumin: 3.1 g/dL — ABNORMAL LOW (ref 3.5–5.0)
Alkaline Phosphatase: 114 U/L (ref 38–126)
Anion gap: 12 (ref 5–15)
BUN: 28 mg/dL — ABNORMAL HIGH (ref 6–20)
CO2: 23 mmol/L (ref 22–32)
Calcium: 8.9 mg/dL (ref 8.9–10.3)
Chloride: 100 mmol/L (ref 98–111)
Creatinine, Ser: 0.86 mg/dL (ref 0.44–1.00)
GFR, Estimated: >60 mL/min (ref >60)
Glucose, Bld: 104 mg/dL — ABNORMAL HIGH (ref 70–99)
Potassium: 4.6 mmol/L (ref 3.5–5.1)
Sodium: 135 mmol/L (ref 135–145)
Total Bilirubin: 0.6 mg/dL (ref 0.3–1.2)
Total Protein: 8.3 g/dL — ABNORMAL HIGH (ref 6.5–8.1)

## 2022-10-31 LAB — CBC
HCT: 33.6 % — ABNORMAL LOW (ref 36.0–46.0)
Hemoglobin: 10.3 g/dL — ABNORMAL LOW (ref 12.0–15.0)
MCH: 24.4 pg — ABNORMAL LOW (ref 26.0–34.0)
MCHC: 30.7 g/dL (ref 30.0–36.0)
MCV: 79.6 fL — ABNORMAL LOW (ref 80.0–100.0)
Platelets: 577 10*3/uL — ABNORMAL HIGH (ref 150–400)
RBC: 4.22 MIL/uL (ref 3.87–5.11)
RDW: 12.9 % (ref 11.5–15.5)
WBC: 5.9 10*3/uL (ref 4.0–10.5)
nRBC: 0 % (ref 0.0–0.2)

## 2022-10-31 LAB — URINALYSIS, MICROSCOPIC (REFLEX)

## 2022-10-31 LAB — URINALYSIS, ROUTINE W REFLEX MICROSCOPIC
Glucose, UA: NEGATIVE mg/dL
Ketones, ur: NEGATIVE mg/dL
Leukocytes,Ua: NEGATIVE
Nitrite: NEGATIVE
Protein, ur: 100 mg/dL — AB
Specific Gravity, Urine: >1.03 — ABNORMAL HIGH (ref 1.005–1.030)
pH: 5.5 (ref 5.0–8.0)

## 2022-10-31 LAB — RESP PANEL BY RT-PCR (RSV, FLU A&B, COVID)  RVPGX2
Influenza A by PCR: POSITIVE — AB
Influenza B by PCR: NEGATIVE
Resp Syncytial Virus by PCR: NEGATIVE
SARS Coronavirus 2 by RT PCR: NEGATIVE

## 2022-10-31 LAB — TSH: TSH: 0.906 u[IU]/mL (ref 0.350–4.500)

## 2022-10-31 LAB — LIPASE, BLOOD: Lipase: 28 U/L (ref 11–51)

## 2022-10-31 LAB — HCG, QUANTITATIVE, PREGNANCY: hCG, Beta Chain, Quant, S: <1 m[IU]/mL (ref <5)

## 2022-10-31 LAB — HIV ANTIBODY (ROUTINE TESTING W REFLEX): HIV Screen 4th Generation wRfx: NONREACTIVE

## 2022-10-31 MED ORDER — ACETAMINOPHEN 650 MG RE SUPP: 650.0000 mg | Freq: Four times a day (QID) | RECTAL | Status: DC | PRN

## 2022-10-31 MED ORDER — ACETAMINOPHEN 325 MG PO TABS
650.0000 mg | ORAL_TABLET | Freq: Four times a day (QID) | ORAL | Status: DC | PRN
  Administered 2022-11-01: 650 mg via ORAL
  Filled 2022-10-31: qty 2

## 2022-10-31 MED ORDER — ONDANSETRON HCL 4 MG/2ML IJ SOLN: 4.0000 mg | Freq: Four times a day (QID) | INTRAMUSCULAR | Status: DC | PRN

## 2022-10-31 MED ORDER — AMOXICILLIN-POT CLAVULANATE 875-125 MG PO TABS
1.0000 | ORAL_TABLET | Freq: Two times a day (BID) | ORAL | Status: DC
  Administered 2022-10-31 – 2022-11-01 (×2): 1 via ORAL
  Filled 2022-10-31 (×2): qty 1

## 2022-10-31 MED ORDER — IOHEXOL 300 MG/ML  SOLN
75.0000 mL | Freq: Once | INTRAMUSCULAR | Status: AC | PRN
  Administered 2022-10-31: 75 mL via INTRAVENOUS

## 2022-10-31 MED ORDER — PANTOPRAZOLE SODIUM 40 MG IV SOLR
40.0000 mg | INTRAVENOUS | Status: DC
  Administered 2022-10-31: 40 mg via INTRAVENOUS
  Filled 2022-10-31: qty 10

## 2022-10-31 MED ORDER — SODIUM CHLORIDE 0.9 % IV BOLUS
500.0000 mL | Freq: Once | INTRAVENOUS | Status: AC
  Administered 2022-10-31: 500 mL via INTRAVENOUS

## 2022-10-31 MED ORDER — METOCLOPRAMIDE HCL 5 MG/ML IJ SOLN
5.0000 mg | Freq: Three times a day (TID) | INTRAMUSCULAR | Status: DC
  Administered 2022-10-31 – 2022-11-01 (×3): 5 mg via INTRAVENOUS
  Filled 2022-10-31 (×3): qty 2

## 2022-10-31 MED ORDER — ONDANSETRON HCL 4 MG PO TABS: 4.0000 mg | ORAL_TABLET | Freq: Four times a day (QID) | ORAL | Status: DC | PRN

## 2022-10-31 MED ORDER — SODIUM CHLORIDE 0.9 % IV SOLN: INTRAVENOUS | Status: AC

## 2022-10-31 MED ORDER — ONDANSETRON 8 MG PO TBDP
8.0000 mg | ORAL_TABLET | Freq: Once | ORAL | Status: AC
  Administered 2022-10-31: 8 mg via ORAL
  Filled 2022-10-31: qty 1

## 2022-10-31 NOTE — ED Provider Notes (Signed)
[place] DEPT Provider Note   CSN: [id number] Arrival date & time: 10/31/22  4132     History  Chief Complaint  Patient presents with   Emesis   Diarrhea   Headache    Elaine Gibson is a 46 y.o. female.   Emesis Associated symptoms: diarrhea and headaches   Diarrhea Associated symptoms: headaches and vomiting   Headache Associated symptoms: diarrhea and vomiting   Patient presents with multiple complaints.  Has been feeling bad for months.  Headache nausea vomiting decreased oral intake.  Weight loss.  States today is the first day she does not have a headache.  Has been coughing.  No fevers and chills.  States in the beginning of October she was diagnosed with influenza A.  States she is very and very hungry for ice.  Been more fatigued.  History of emphysema and smoking.      Past Medical History:  Diagnosis Date   Emphysema lung ([place])    Emphysema of lung ([place])    Hypothyroidism    tumors    Scoliosis     Home Medications Prior to Admission medications   Medication Sig Start Date End Date Taking? Authorizing Provider  acetaminophen (TYLENOL) 500 MG tablet Take 2 tablets (1,000 mg total) by mouth every 6 (six) hours as needed. 03/27/21   Tedd Sias, PA  albuterol (PROVENTIL HFA;VENTOLIN HFA) 108 (90 Base) MCG/ACT inhaler Inhale 2 puffs into the lungs daily. 12/13/18   Lajean Manes, CNM  dicyclomine (BENTYL) 20 MG tablet Take 1 tablet (20 mg total) by mouth 2 (two) times daily. 03/27/21   Tedd Sias, PA  Fluticasone-Umeclidin-Vilant (TRELEGY ELLIPTA) 100-62.5-25 MCG/INH AEPB Inhale 1 puff into the lungs daily. 11/07/19   Margaretha Seeds, MD  Fluticasone-Umeclidin-Vilant (TRELEGY ELLIPTA) 100-62.5-25 MCG/INH AEPB Inhale 1 puff into the lungs daily. Patient not taking: No sig reported 11/07/19   Margaretha Seeds, MD  ibuprofen (ADVIL) 800 MG tablet Take 1 tablet (800 mg total) by mouth every 6 (six) hours as  needed. 03/27/21   Tedd Sias, PA  methocarbamol (ROBAXIN) 500 MG tablet Take 1 tablet (500 mg total) by mouth 2 (two) times daily. 03/27/21   Tedd Sias, PA  methocarbamol (ROBAXIN) 500 MG tablet Take 1 tablet (500 mg total) by mouth 2 (two) times daily. 07/26/22   Suzy Bouchard, PA-C  naproxen (NAPROSYN) 500 MG tablet Take 1 tablet (500 mg total) by mouth 2 (two) times daily. 07/26/22   Suzy Bouchard, PA-C  ondansetron (ZOFRAN ODT) 4 MG disintegrating tablet Take 1 tablet (4 mg total) by mouth every 8 (eight) hours as needed for nausea or vomiting. 03/27/21   Tedd Sias, PA      Allergies    Patient has no known allergies.    Review of Systems   Review of Systems  Gastrointestinal:  Positive for diarrhea and vomiting.  Neurological:  Positive for headaches.    Physical Exam Updated Vital Signs BP 105/75   Pulse (!) 106   Temp 98.5 F (36.9 C) (Oral)   Resp 14   Ht 5\' 5"  (1.651 m)   Wt 36.3 kg   LMP 10/28/2022 (Approximate)   SpO2 94%   BMI 13.31 kg/m  Physical Exam Vitals and nursing note reviewed.  HENT:     Head: Atraumatic.  Eyes:     Pupils: Pupils are equal, round, and reactive to light.  Cardiovascular:     Rate and  Rhythm: Regular rhythm. Tachycardia present.  Pulmonary:     Breath sounds: No rhonchi.  Chest:     Chest wall: No tenderness.  Musculoskeletal:     Cervical back: Neck supple.  Skin:    Coloration: Skin is not cyanotic.  Neurological:     Mental Status: She is alert.  Psychiatric:        Mood and Affect: Mood normal.     ED Results / Procedures / Treatments   Labs (all labs ordered are listed, but only abnormal results are displayed) Labs Reviewed  RESP PANEL BY RT-PCR (RSV, FLU A&B, COVID)  RVPGX2 - Abnormal; Notable for the following components:      Result Value   Influenza A by PCR POSITIVE (*)    All other components within normal limits  COMPREHENSIVE METABOLIC PANEL - Abnormal; Notable for the following  components:   Glucose, Bld 104 (*)    BUN 28 (*)    Total Protein 8.3 (*)    Albumin 3.1 (*)    All other components within normal limits  CBC - Abnormal; Notable for the following components:   Hemoglobin 10.3 (*)    HCT 33.6 (*)    MCV 79.6 (*)    MCH 24.4 (*)    Platelets 577 (*)    All other components within normal limits  URINALYSIS, ROUTINE W REFLEX MICROSCOPIC - Abnormal; Notable for the following components:   Specific Gravity, Urine >1.030 (*)    Hgb urine dipstick TRACE (*)    Bilirubin Urine SMALL (*)    Protein, ur 100 (*)    All other components within normal limits  URINALYSIS, MICROSCOPIC (REFLEX) - Abnormal; Notable for the following components:   Bacteria, UA MANY (*)    All other components within normal limits  LIPASE, BLOOD  HCG, QUANTITATIVE, PREGNANCY  HIV ANTIBODY (ROUTINE TESTING W REFLEX)  TSH    EKG EKG Interpretation  Date/Time:  Monday October 31 2022 09:30:00 EST Ventricular Rate:  130 PR Interval:  106 QRS Duration: 68 QT Interval:  286 QTC Calculation: 421 R Axis:   94 Text Interpretation: Sinus tachycardia Consider right atrial enlargement Probable lateral infarct, old No old tracing to compare Confirmed by Davonna Belling [phone]) on 10/31/2022 10:20:16 AM  Radiology CT CHEST ABDOMEN PELVIS W CONTRAST  Result Date: 10/31/2022 CLINICAL DATA:  Lung mass. Assess for metastatic disease. Patient with nausea, diarrhea, headache and decreased oral in trach for several months. Intermittent cough. EXAM: CT CHEST, ABDOMEN, AND PELVIS WITH CONTRAST TECHNIQUE: Multidetector CT imaging of the chest, abdomen and pelvis was performed following the standard protocol during bolus administration of intravenous contrast. RADIATION DOSE REDUCTION: This exam was performed according to the departmental dose-optimization program which includes automated exposure control, adjustment of the mA and/or kV according to patient size and/or use of iterative  reconstruction technique. CONTRAST:  29mL OMNIPAQUE IOHEXOL 300 MG/ML  SOLN COMPARISON:  Chest CT, 01/21/2020.  Current chest radiograph. FINDINGS: CT CHEST FINDINGS Cardiovascular: Heart normal in size. No coronary artery calcifications. No pericardial effusion. Normal great vessels. Mediastinum/Nodes: Enlarged heterogeneous left thyroid lobe with a discrete 2.2 cm nodule, other smaller nodules. This is similar to the appearance on the prior chest CT. Patient has had a thyroid biopsy, performed on 03/27/2019. No additional follow-up recommended. No neck base or mediastinal masses. Abnormal left upper hilar soft tissue consistent with enlarged lymph node, 1.6 cm in short axis. No other hilar adenopathy. No mediastinal adenopathy. Trachea and esophagus are unremarkable. Lungs/Pleura:  Left upper lobe pleural based mass, 4.7 x 2.9 cm transversely 4.6 cm superior to inferior. This is new since the prior CT. No other lung masses. No suspicious nodules. Patchy ground-glass type opacities noted in the left upper lobe mostly medial and posteriorly. Linear type right upper lobe opacities consistent with scarring stable from the prior CT. Emphysema with large right upper lobe bullae, unchanged. No pleural effusion or pneumothorax. Musculoskeletal: No fracture or acute finding. No osteoblastic or osteolytic lesions. Spine stabilization rods stable from the prior CT. No chest wall mass. CT ABDOMEN PELVIS FINDINGS Hepatobiliary: No focal liver abnormality is seen. No gallstones, gallbladder wall thickening, or biliary dilatation. Pancreas: Unremarkable. No pancreatic ductal dilatation or surrounding inflammatory changes. Spleen: Normal in size without focal abnormality. Adrenals/Urinary Tract: No adrenal masses. Kidneys normal in size, orientation and position with symmetric enhancement. No convincing renal mass, no stone and no hydronephrosis. Ureters not well visualized, but are grossly normal in course and in caliber. Bladder  mostly decompressed, otherwise unremarkable. Stomach/Bowel: Bowel assessment limited due to lack of contrast and peritoneal fat. Allowing for this, stomach is unremarkable. Small bowel and colon are normal in caliber. No wall thickening or convincing inflammation. Vascular/Lymphatic: Aortic and iliac artery atherosclerotic calcifications. No aneurysm. No enlarged lymph nodes. Reproductive: Uterus and bilateral adnexa are unremarkable. Other: No abdominal wall hernia or abnormality. No abdominopelvic ascites. Musculoskeletal: No fracture or acute finding. No osteoblastic or osteolytic lesions. Spine stabilization rods are stable from the prior CT. IMPRESSION: 1. Pleural based left upper lobe mass suspicious for bronchogenic carcinoma. This measures 4.7 x 2.9 x 4.6 cm. Tissue sampling recommended. 2. Patchy airspace opacities also noted in the left upper lobe, suspicious for multifocal pneumonia. 3. Enlarged superior left hilar lymph node suspected to be metastatic disease. 4. No other evidence of metastatic disease in the chest. No evidence of metastatic disease in the abdomen or pelvis. 5. No acute abnormality in the abdomen or pelvis. 6. Emphysema with large right upper lobe bulla stable from the prior CT. Electronically Signed   By: Lajean Manes M.D.   On: 10/31/2022 13:06   CT HEAD W & WO CONTRAST (5MM)  Result Date: 10/31/2022 CLINICAL DATA:  Brain metastases suspected. EXAM: CT HEAD WITHOUT AND WITH CONTRAST TECHNIQUE: Contiguous axial images were obtained from the base of the skull through the vertex without and with intravenous contrast. RADIATION DOSE REDUCTION: This exam was performed according to the departmental dose-optimization program which includes automated exposure control, adjustment of the mA and/or kV according to patient size and/or use of iterative reconstruction technique. CONTRAST:  77mL OMNIPAQUE IOHEXOL 300 MG/ML  SOLN COMPARISON:  None Available. FINDINGS: Brain: No evidence of acute  infarction, hemorrhage, hydrocephalus, extra-axial collection or mass lesion/mass effect. Left occipital DVA Vascular: No hyperdense vessel or unexpected calcification. Visible vessels are patent. Skull: Normal. Negative for fracture or focal lesion. Sinuses/Orbits: No acute finding. Other: None. IMPRESSION: No acute intracranial abnormality. No evidence of metastatic disease. Note that MRI is more sensitive for detection of small intracranial metastases. If there is high clinical suspicious, further evaluation with a contrast enhanced MRI of the brain should be considered. Electronically Signed   By: Marin Roberts M.D.   On: 10/31/2022 12:53   DG Chest 2 View  Result Date: 10/31/2022 CLINICAL DATA:  Cough and shortness of breath. EXAM: CHEST - 2 VIEW COMPARISON:  07/05/2021 FINDINGS: Heart size remains normal. Severe bullous emphysema is again seen with severe bullous disease in the right upper lobe. New  masslike opacity is seen in the medial left upper lobe measuring approximately 4 cm, highly suspicious for bronchogenic carcinoma. No evidence of pleural effusion. Thoracic spine posterior fixation rods again noted. IMPRESSION: New masslike opacity in medial left upper lobe, highly suspicious for bronchogenic carcinoma. Chest CT with contrast is recommended for further evaluation. Severe bullous emphysema. Electronically Signed   By: Marlaine Hind M.D.   On: 10/31/2022 09:50    Procedures Procedures    Medications Ordered in ED Medications  ondansetron (ZOFRAN-ODT) disintegrating tablet 8 mg (8 mg Oral Given 10/31/22 0921)  sodium chloride 0.9 % bolus 500 mL (500 mLs Intravenous New Bag/Given 10/31/22 1119)  iohexol (OMNIPAQUE) 300 MG/ML solution 75 mL (75 mLs Intravenous Contrast Given 10/31/22 1225)    ED Course/ Medical Decision Making/ A&P                           Medical Decision Making Amount and/or Complexity of Data Reviewed Labs: ordered. Radiology: ordered.  Risk Prescription  drug management.   Patient multiple complaints.  Cough.  Influenza positive.  However has had positive flu test from 2 months ago that was also flu a.  Differential diagnosis long includes infection anemia electrolyte abnormality.  However unfortunately chest x-ray done and independently interpreted shows left upper lobe mass.  Likely malignancy.  With tachycardia headaches and nausea vomiting diarrhea will get chest abdomen pelvis CT to evaluate for both the mass and potentially metastatic disease into the abdomen.  Will also get head CT with contrast to evaluate for metastatic disease and the headaches.  Discussed with Dr. Pascal Lux from radiology about test choice.  CT scan head reassuring.  CT scan chest unfortunate does show lung mass.  However reassuring otherwise with only potential left hilar lymph node.  Has been hypotensive.  With unable to eat and drink a weight loss fatigue and potential pneumonia on CT scan I feel she would benefit from mission to the hospital.  Will discuss with hospitalist.        Final Clinical Impression(s) / ED Diagnoses Final diagnoses:  Dehydration  Lung mass  Pneumonia due to infectious organism, unspecified laterality, unspecified part of lung    Rx / DC Orders ED Discharge Orders     None         Davonna Belling, MD 10/31/22 1407

## 2022-10-31 NOTE — H&P (Signed)
History and Physical    Patient: Elaine Gibson ENI:[id number] DOB: Oct 02, 1976 DOA: 10/31/2022 DOS: the patient was seen and examined on 10/31/2022 PCP: Lujean Amel, MD  Patient coming from: Home  Chief Complaint:  Chief Complaint  Patient presents with   Emesis   Diarrhea   Headache   HPI: Elaine Gibson is a 45 y.o. female with medical history significant of emphysema, hypothyroidism, scoliosis, protein calorie malnutrition, pulmonary nodule tobacco use who had influenza A in October and is presenting to the emergency department with weight loss, nausea and vomiting since then.  She has been restricting mostly to ice chips and occasional liquids.  She has lost about 25 to 30 pounds of weight.  No fever or chills, positive night sweats.  No rhinorrhea, sore throat, wheezing or hemoptysis.  She has been coughing yellowish/brownish phlegm.  No chest pain, palpitations, diaphoresis, PND, orthopnea or pitting edema of the lower extremities.  No diarrhea, constipation, melena or hematochezia.  No flank pain, dysuria, frequency or hematuria.  No polyuria, polydipsia, polyphagia or blurred vision.   ED course: Initial vital signs were temperature 98.4 F, pulse 125, respirations 16, BP 93/76 mmHg and O2 sat 100% on room air.  The patient received 500 mL normal saline bolus.  Lab work: Urinalysis showed increase of specific gravity over 1.030, trace hemoglobinuria small bilirubinuria, proteinuria 100 mg/dL and many bacteria microscopic examination.  Lipase and hCG beta chain normal.  Influenza A PCR was positive.  Negative coronavirus, influenza B and RSV.  CBC showed a white count of 5.9, hemoglobin 10.2 g/dL platelets 577.  CMP shows a glucose of 104 mg/deciliter.  Total protein 8.3 and albumin 3.1 g/dL.  The rest of the CMP measurements were normal.  Imaging: Two-view chest radiograph new masslike opacity in the medial left upper lobe, highly suspicious for bronchogenic carcinoma.   CT chest/abdomen/pelvis with contrast showed a pleural-based left upper lobe mass suspicious for bronchogenic carcinoma measuring 4.7 x 2.9 x 4.6 cm.  Patchy airspace opacities also noted in the left upper lobe suspicious for multifocal pneumonia.  Enlarged superior left hilar lymph likely to be metastatic disease.  No evidence of metastatic disease in the chest or evidence of metastatic disease in the abdomen or pelvis.  No acute abnormality in the abdomen or pelvis.  Emphysema with large right upper lobe bulla which is stable from prior CT.   Review of Systems: As mentioned in the history of present illness. All other systems reviewed and are negative.  Past Medical History:  Diagnosis Date   Emphysema lung ([place])    Emphysema of lung ([place])    Hypothyroidism    tumors    Scoliosis    Past Surgical History:  Procedure Laterality Date   BACK SURGERY     CESAREAN SECTION     Social History:  reports that she quit smoking about 3 years ago. Her smoking use included cigars. She started smoking about 37 years ago. She has never used smokeless tobacco. She reports that she does not drink alcohol and does not use drugs.  No Known Allergies  Family History  Problem Relation Age of Onset   Hypertension Mother    Kidney disease Mother    Stomach cancer Father    Gestational diabetes Sister     Prior to Admission medications   Medication Sig Start Date End Date Taking? Authorizing Provider  acetaminophen (TYLENOL) 500 MG tablet Take 2 tablets (1,000 mg total) by mouth every 6 (six) hours as  needed. Patient taking differently: Take 1,000 mg by mouth every 6 (six) hours as needed for mild pain. 03/27/21  Yes F[place], Wylder S, PA  albuterol (PROVENTIL HFA;VENTOLIN HFA) 108 (90 Base) MCG/ACT inhaler Inhale 2 puffs into the lungs daily. 12/13/18  Yes Lajean Manes, CNM  Fluticasone-Umeclidin-Vilant (TRELEGY ELLIPTA) 100-62.5-25 MCG/INH AEPB Inhale 1 puff into the lungs daily. 11/07/19  Yes  Margaretha Seeds, MD  dicyclomine (BENTYL) 20 MG tablet Take 1 tablet (20 mg total) by mouth 2 (two) times daily. Patient not taking: Reported on 10/31/2022 03/27/21   Tedd Sias, PA  Fluticasone-Umeclidin-Vilant (TRELEGY ELLIPTA) 100-62.5-25 MCG/INH AEPB Inhale 1 puff into the lungs daily. Patient not taking: Reported on 03/27/2021 11/07/19   Margaretha Seeds, MD  ibuprofen (ADVIL) 800 MG tablet Take 1 tablet (800 mg total) by mouth every 6 (six) hours as needed. Patient not taking: Reported on 10/31/2022 03/27/21   Tedd Sias, PA  methocarbamol (ROBAXIN) 500 MG tablet Take 1 tablet (500 mg total) by mouth 2 (two) times daily. Patient not taking: Reported on 10/31/2022 03/27/21   Tedd Sias, PA  methocarbamol (ROBAXIN) 500 MG tablet Take 1 tablet (500 mg total) by mouth 2 (two) times daily. Patient not taking: Reported on 10/31/2022 07/26/22   Suzy Bouchard, PA-C  naproxen (NAPROSYN) 500 MG tablet Take 1 tablet (500 mg total) by mouth 2 (two) times daily. Patient not taking: Reported on 10/31/2022 07/26/22   Charmaine Downs C, PA-C  ondansetron (ZOFRAN ODT) 4 MG disintegrating tablet Take 1 tablet (4 mg total) by mouth every 8 (eight) hours as needed for nausea or vomiting. Patient not taking: Reported on 10/31/2022 03/27/21   Tedd Sias, [place]    Physical Exam: Vitals:   10/31/22 1215 10/31/22 1252 10/31/22 1315 10/31/22 1400  BP: 99/74 101/73 105/75 96/72  Pulse: (!) 111 (!) 107 (!) 106 99  Resp: (!) 24 17 14 14   Temp:  98.5 F (36.9 C)    TempSrc:  Oral    SpO2: 96% 96% 94% 96%  Weight:      Height:       Physical Exam Vitals and nursing note reviewed.  Constitutional:      General: She is awake. She is not in acute distress.    Appearance: She is well-developed. She is cachectic. She is ill-appearing.  HENT:     Head: Normocephalic.     Nose: No rhinorrhea.     Mouth/Throat:     Mouth: Mucous membranes are moist.  Eyes:     General: No scleral  icterus.    Pupils: Pupils are equal, round, and reactive to light.  Neck:     Vascular: No JVD.  Cardiovascular:     Rate and Rhythm: Normal rate and regular rhythm.     Heart sounds: S1 normal and S2 normal.  Pulmonary:     Effort: Pulmonary effort is normal.     Breath sounds: Examination of the left-upper field reveals rales. Rales present. No wheezing or rhonchi.  Abdominal:     General: Bowel sounds are normal. There is no distension.     Palpations: Abdomen is soft.     Tenderness: There is no abdominal tenderness. There is no guarding.  Musculoskeletal:     Cervical back: Neck supple.     Right lower leg: No edema.     Left lower leg: No edema.  Skin:    General: Skin is warm and dry.  Neurological:  General: No focal deficit present.     Mental Status: She is alert and oriented to person, place, and time.  Psychiatric:        Mood and Affect: Mood normal.        Behavior: Behavior normal. Behavior is cooperative.   Data Reviewed:  Results are pending, will review when available.  Assessment and Plan: Principal Problem:   Mass of left lung No distant metastasis. Inpatient/MedSurg. Discussed with PCCM (Dr. Silas Flood). They will evaluate in the morning for possible bronchoscopy. If unable to obtain, consider IR evaluation. Referred to oncology after biopsy obtained.  Active Problems:   COPD (chronic obstructive pulmonary disease) (HCC) Bronchodilators as needed.    Protein calorie malnutrition (HCC) Protein supplementation. Consider nutritional services evaluation. Ondansetron as needed. Metoclopramide 5 mg IVP every 8 hours.    Normocytic anemia In the setting of poor nutrition and cancer.    Thrombocytosis Secondary to anemia and cancer.     Advance Care Planning:   Code Status: Full Code   Consults: PCCM will see tomorrow.  Family Communication:   Severity of Illness: The appropriate patient status for this patient is INPATIENT. Inpatient  status is judged to be reasonable and necessary in order to provide the required intensity of service to ensure the patient's safety. The patient's presenting symptoms, physical exam findings, and initial radiographic and laboratory data in the context of their chronic comorbidities is felt to place them at high risk for further clinical deterioration. Furthermore, it is not anticipated that the patient will be medically stable for discharge from the hospital within 2 midnights of admission.   * I certify that at the point of admission it is my clinical judgment that the patient will require inpatient hospital care spanning beyond 2 midnights from the point of admission due to high intensity of service, high risk for further deterioration and high frequency of surveillance required.*  Author: Reubin Milan, MD 10/31/2022 2:57 PM  For on call review [email or web address].   This document was prepared using Dragon voice recognition software and may contain some unintended transcription errors.

## 2022-10-31 NOTE — ED Triage Notes (Addendum)
Patient reports N/V/D and a headache x 1 month. Patient states this is the first day without a headache though.  Patient denies abdominal pain.

## 2022-10-31 NOTE — ED Provider Triage Note (Signed)
Emergency Medicine Provider Triage Evaluation Note  Elaine Gibson , a 46 y.o. female  was evaluated in triage.  Pt complains of nausea, headache, diarrhea, decreased p.o. intake for the past months.  Notes intermittent worsening of symptoms no wishes she states today her symptoms are very mild.  Denies current headache.  States she has had intermittent cough for which she is experiencing currently.  Denies fever, chills, abdominal pain, hematemesis, medic easier, melena, urinary/vaginal symptoms.  Review of Systems  Positive: Above Negative:   Physical Exam  BP 93/76 (BP Location: Left Arm)   Pulse (!) 125   Temp 98.4 F (36.9 C) (Oral)   Resp 16   Ht 5\' 5"  (1.651 m)   Wt 36.3 kg   LMP 10/28/2022   SpO2 100%   BMI 13.31 kg/m  Gen:   Awake, no distress   Resp:  Normal effort  MSK:   Moves extremities without difficulty  Other:    Medical Decision Making  Medically screening exam initiated at 9:07 AM.  Appropriate orders placed.  Elaine Gibson was informed that the remainder of the evaluation will be completed by another provider, this initial triage assessment does not replace that evaluation, and the importance of remaining in the ED until their evaluation is complete.     Wilnette Kales, [place] 10/31/22 [phone]

## 2022-10-31 NOTE — ED Notes (Signed)
ED TO INPATIENT HANDOFF REPORT   S Name/Age/Gender Elaine Gibson Elaine Gibson 46 y.o. female Room/Bed: WA01/WA01  Code Status   Code Status: Full Code  Home/SNF/Other Patient oriented to: self, place, time, and situation Is this baseline? Yes   Triage Complete: Triage complete  Chief Complaint Mass of left lung [R91.8]  Triage Note Patient reports N/V/D and a headache x 1 month. Patient states this is the first day without a headache though.  Patient denies abdominal pain.   Allergies No Known Allergies  Level of Care/Admitting Diagnosis ED Disposition     ED Disposition  Admit   Condition  --   Comment  Hospital Area: [facility] [[id number]]  Level of Care: Med-Surg [16]  May admit patient to [facility] or [facility] if equivalent level of care is available:: No  Covid Evaluation: Asymptomatic - no recent exposure (last 10 days) testing not required  Diagnosis: Mass of left lung [[id number]]  Admitting Physician: Reubin Milan [[id number]]  Attending Physician: Reubin Milan [[id number]]  Certification:: I certify this patient will need inpatient services for at least 2 midnights  Estimated Length of Stay: 2          B Medical/Surgery History Past Medical History:  Diagnosis Date   Emphysema lung ([place])    Emphysema of lung ([place])    Hypothyroidism    tumors    Scoliosis    Past Surgical History:  Procedure Laterality Date   BACK SURGERY     CESAREAN SECTION       A IV Location/Drains/Wounds Patient Lines/Drains/Airways Status     Active Line/Drains/Airways     Name Placement date Placement time Site Days   Peripheral IV 10/31/22 22 G Anterior;Left Forearm 10/31/22  1119  Forearm  less than 1            Intake/Output Last 24 hours  Intake/Output Summary (Last 24 hours) at 10/31/2022 1530 Last data filed at 10/31/2022 1431 Gross per 24 hour  Intake 500 ml  Output --  Net 500 ml    Labs/Imaging Results for orders  placed or performed during the hospital encounter of 10/31/22 (from the past 48 hour(s))  Resp panel by RT-PCR (RSV, Flu A&B, Covid) Anterior Nasal Swab     Status: Abnormal   Collection Time: 10/31/22  9:20 AM   Specimen: Anterior Nasal Swab  Result Value Ref Range   SARS Coronavirus 2 by RT PCR NEGATIVE NEGATIVE    Comment: (NOTE) SARS-CoV-2 target nucleic acids are NOT DETECTED.  The SARS-CoV-2 RNA is generally detectable in upper respiratory specimens during the acute phase of infection. The lowest concentration of SARS-CoV-2 viral copies this assay can detect is 138 copies/mL. A negative result does not preclude SARS-Cov-2 infection and should not be used as the sole basis for treatment or other patient management decisions. A negative result may occur with  improper specimen collection/handling, submission of specimen other than nasopharyngeal swab, presence of viral mutation(s) within the areas targeted by this assay, and inadequate number of viral copies(<138 copies/mL). A negative result must be combined with clinical observations, patient history, and epidemiological information. The expected result is Negative.  Fact Sheet for Patients:  EntrepreneurPulse.com.au  Fact Sheet for Healthcare Providers:  IncredibleEmployment.be  This test is no t yet approved or cleared by the [place] FDA and  has been authorized for detection and/or diagnosis of SARS-CoV-2 by FDA under an Emergency Use Authorization (EUA). This EUA will remain  in effect (meaning  this test can be used) for the duration of the COVID-19 declaration under Section 564(b)(1) of the Act, 21 U.S.C.section 360bbb-3(b)(1), unless the authorization is terminated  or revoked sooner.       Influenza A by PCR POSITIVE (A) NEGATIVE   Influenza B by PCR NEGATIVE NEGATIVE    Comment: (NOTE) The Xpert Xpress SARS-CoV-2/FLU/RSV plus assay is intended as an aid in the diagnosis  of influenza from Nasopharyngeal swab specimens and should not be used as a sole basis for treatment. Nasal washings and aspirates are unacceptable for Xpert Xpress SARS-CoV-2/FLU/RSV testing.  Fact Sheet for Patients: EntrepreneurPulse.com.au  Fact Sheet for Healthcare Providers: IncredibleEmployment.be  This test is not yet approved or cleared by the [place] FDA and has been authorized for detection and/or diagnosis of SARS-CoV-2 by FDA under an Emergency Use Authorization (EUA). This EUA will remain in effect (meaning this test can be used) for the duration of the COVID-19 declaration under Section 564(b)(1) of the Act, 21 U.S.C. section 360bbb-3(b)(1), unless the authorization is terminated or revoked.     Resp Syncytial Virus by PCR NEGATIVE NEGATIVE    Comment: (NOTE) Fact Sheet for Patients: EntrepreneurPulse.com.au  Fact Sheet for Healthcare Providers: IncredibleEmployment.be  This test is not yet approved or cleared by the [place] FDA and has been authorized for detection and/or diagnosis of SARS-CoV-2 by FDA under an Emergency Use Authorization (EUA). This EUA will remain in effect (meaning this test can be used) for the duration of the COVID-19 declaration under Section 564(b)(1) of the Act, 21 U.S.C. section 360bbb-3(b)(1), unless the authorization is terminated or revoked.  Performed at [facility], [place] [address], [place], [place] [address]   Urinalysis, Routine w reflex microscopic     Status: Abnormal   Collection Time: 10/31/22 11:02 AM  Result Value Ref Range   Color, Urine YELLOW YELLOW   APPearance CLEAR CLEAR   Specific Gravity, Urine >1.030 (H) 1.005 - 1.030   pH 5.5 5.0 - 8.0   Glucose, UA NEGATIVE NEGATIVE mg/dL   Hgb urine dipstick TRACE (A) NEGATIVE   Bilirubin Urine SMALL (A) NEGATIVE   Ketones, ur NEGATIVE NEGATIVE mg/dL   Protein, ur 100  (A) NEGATIVE mg/dL   Nitrite NEGATIVE NEGATIVE   Leukocytes,Ua NEGATIVE NEGATIVE    Comment: Performed at [facility], [place] [address] [place], [place] [address]  Urinalysis, Microscopic (reflex)     Status: Abnormal   Collection Time: 10/31/22 11:02 AM  Result Value Ref Range   RBC / HPF 0-5 0 - 5 RBC/hpf   WBC, UA 0-5 0 - 5 WBC/hpf   Bacteria, UA MANY (A) NONE SEEN   Squamous Epithelial / LPF 6-10 0 - 5   Mucus PRESENT    Urine-Other HYALINE CASTS     Comment: Performed at [facility], [place] [address], [place], [place] [address]  Lipase, blood     Status: None   Collection Time: 10/31/22 11:14 AM  Result Value Ref Range   Lipase 28 11 - 51 U/L    Comment: Performed at [facility], [place] [address], [place], [place] [address]  Comprehensive metabolic panel     Status: Abnormal   Collection Time: 10/31/22 11:14 AM  Result Value Ref Range   Sodium 135 135 - 145 mmol/L   Potassium 4.6 3.5 - 5.1 mmol/L   Chloride 100 98 - 111 mmol/L   CO2 23 22 - 32 mmol/L   Glucose, Bld 104 (  H) 70 - 99 mg/dL    Comment: Glucose reference range applies only to samples taken after fasting for at least 8 hours.   BUN 28 (H) 6 - 20 mg/dL   Creatinine, Ser 0.86 0.44 - 1.00 mg/dL   Calcium 8.9 8.9 - 10.3 mg/dL   Total Protein 8.3 (H) 6.5 - 8.1 g/dL   Albumin 3.1 (L) 3.5 - 5.0 g/dL   AST 21 15 - 41 U/L   ALT 17 0 - 44 U/L   Alkaline Phosphatase 114 38 - 126 U/L   Total Bilirubin 0.6 0.3 - 1.2 mg/dL   GFR, Estimated >60 >60 mL/min    Comment: (NOTE) Calculated using the CKD-EPI Creatinine Equation (2021)    Anion gap 12 5 - 15    Comment: Performed at [facility], [place] [address] [place], [place] [phone]  CBC     Status: Abnormal   Collection Time: 10/31/22 11:14 AM  Result Value Ref Range   WBC 5.9 4.0 - 10.5 K/uL   RBC 4.22 3.87 - 5.11 MIL/uL   Hemoglobin 10.3 (L) 12.0 - 15.0 g/dL   HCT 33.6 (L) 36.0 - 46.0 %    MCV 79.6 (L) 80.0 - 100.0 fL   MCH 24.4 (L) 26.0 - 34.0 pg   MCHC 30.7 30.0 - 36.0 g/dL   RDW 12.9 11.5 - 15.5 %   Platelets 577 (H) 150 - 400 K/uL   nRBC 0.0 0.0 - 0.2 %    Comment: Performed at [facility], [place] [address], [place], [place] [address]  hCG, quantitative, pregnancy     Status: None   Collection Time: 10/31/22 11:14 AM  Result Value Ref Range   hCG, Beta Chain, Quant, S <1 <5 mIU/mL    Comment:          GEST. AGE      CONC.  (mIU/mL)   <=1 WEEK        5 - 50     2 WEEKS       50 - 500     3 WEEKS       100 - 10,000     4 WEEKS     1,000 - 30,000     5 WEEKS     3,500 - 115,000   6-8 WEEKS     12,000 - 270,000    12 WEEKS     15,000 - 220,000        FEMALE AND NON-PREGNANT FEMALE:     LESS THAN 5 mIU/mL Performed at [facility], [place] [address] [place], [place] [address]    CT CHEST ABDOMEN PELVIS W CONTRAST  Result Date: 10/31/2022 CLINICAL DATA:  Lung mass. Assess for metastatic disease. Patient with nausea, diarrhea, headache and decreased oral in trach for several months. Intermittent cough. EXAM: CT CHEST, ABDOMEN, AND PELVIS WITH CONTRAST TECHNIQUE: Multidetector CT imaging of the chest, abdomen and pelvis was performed following the standard protocol during bolus administration of intravenous contrast. RADIATION DOSE REDUCTION: This exam was performed according to the departmental dose-optimization program which includes automated exposure control, adjustment of the mA and/or kV according to patient size and/or use of iterative reconstruction technique. CONTRAST:  56mL OMNIPAQUE IOHEXOL 300 MG/ML  SOLN COMPARISON:  Chest CT, 01/21/2020.  Current chest radiograph. FINDINGS: CT CHEST FINDINGS Cardiovascular: Heart normal in size. No coronary artery calcifications. No pericardial effusion. Normal great vessels. Mediastinum/Nodes: Enlarged heterogeneous left thyroid lobe with a discrete 2.2 cm  nodule, other smaller nodules. This is  similar to the appearance on the prior chest CT. Patient has had a thyroid biopsy, performed on 03/27/2019. No additional follow-up recommended. No neck base or mediastinal masses. Abnormal left upper hilar soft tissue consistent with enlarged lymph node, 1.6 cm in short axis. No other hilar adenopathy. No mediastinal adenopathy. Trachea and esophagus are unremarkable. Lungs/Pleura: Left upper lobe pleural based mass, 4.7 x 2.9 cm transversely 4.6 cm superior to inferior. This is new since the prior CT. No other lung masses. No suspicious nodules. Patchy ground-glass type opacities noted in the left upper lobe mostly medial and posteriorly. Linear type right upper lobe opacities consistent with scarring stable from the prior CT. Emphysema with large right upper lobe bullae, unchanged. No pleural effusion or pneumothorax. Musculoskeletal: No fracture or acute finding. No osteoblastic or osteolytic lesions. Spine stabilization rods stable from the prior CT. No chest wall mass. CT ABDOMEN PELVIS FINDINGS Hepatobiliary: No focal liver abnormality is seen. No gallstones, gallbladder wall thickening, or biliary dilatation. Pancreas: Unremarkable. No pancreatic ductal dilatation or surrounding inflammatory changes. Spleen: Normal in size without focal abnormality. Adrenals/Urinary Tract: No adrenal masses. Kidneys normal in size, orientation and position with symmetric enhancement. No convincing renal mass, no stone and no hydronephrosis. Ureters not well visualized, but are grossly normal in course and in caliber. Bladder mostly decompressed, otherwise unremarkable. Stomach/Bowel: Bowel assessment limited due to lack of contrast and peritoneal fat. Allowing for this, stomach is unremarkable. Small bowel and colon are normal in caliber. No wall thickening or convincing inflammation. Vascular/Lymphatic: Aortic and iliac artery atherosclerotic calcifications. No aneurysm. No enlarged lymph nodes. Reproductive: Uterus and  bilateral adnexa are unremarkable. Other: No abdominal wall hernia or abnormality. No abdominopelvic ascites. Musculoskeletal: No fracture or acute finding. No osteoblastic or osteolytic lesions. Spine stabilization rods are stable from the prior CT. IMPRESSION: 1. Pleural based left upper lobe mass suspicious for bronchogenic carcinoma. This measures 4.7 x 2.9 x 4.6 cm. Tissue sampling recommended. 2. Patchy airspace opacities also noted in the left upper lobe, suspicious for multifocal pneumonia. 3. Enlarged superior left hilar lymph node suspected to be metastatic disease. 4. No other evidence of metastatic disease in the chest. No evidence of metastatic disease in the abdomen or pelvis. 5. No acute abnormality in the abdomen or pelvis. 6. Emphysema with large right upper lobe bulla stable from the prior CT. Electronically Signed   By: Lajean Manes M.D.   On: 10/31/2022 13:06   CT HEAD W & WO CONTRAST (5MM)  Result Date: 10/31/2022 CLINICAL DATA:  Brain metastases suspected. EXAM: CT HEAD WITHOUT AND WITH CONTRAST TECHNIQUE: Contiguous axial images were obtained from the base of the skull through the vertex without and with intravenous contrast. RADIATION DOSE REDUCTION: This exam was performed according to the departmental dose-optimization program which includes automated exposure control, adjustment of the mA and/or kV according to patient size and/or use of iterative reconstruction technique. CONTRAST:  69mL OMNIPAQUE IOHEXOL 300 MG/ML  SOLN COMPARISON:  None Available. FINDINGS: Brain: No evidence of acute infarction, hemorrhage, hydrocephalus, extra-axial collection or mass lesion/mass effect. Left occipital DVA Vascular: No hyperdense vessel or unexpected calcification. Visible vessels are patent. Skull: Normal. Negative for fracture or focal lesion. Sinuses/Orbits: No acute finding. Other: None. IMPRESSION: No acute intracranial abnormality. No evidence of metastatic disease. Note that MRI is more  sensitive for detection of small intracranial metastases. If there is high clinical suspicious, further evaluation with a contrast enhanced MRI of the brain  should be considered. Electronically Signed   By: Marin Roberts M.D.   On: 10/31/2022 12:53   DG Chest 2 View  Result Date: 10/31/2022 CLINICAL DATA:  Cough and shortness of breath. EXAM: CHEST - 2 VIEW COMPARISON:  07/05/2021 FINDINGS: Heart size remains normal. Severe bullous emphysema is again seen with severe bullous disease in the right upper lobe. New masslike opacity is seen in the medial left upper lobe measuring approximately 4 cm, highly suspicious for bronchogenic carcinoma. No evidence of pleural effusion. Thoracic spine posterior fixation rods again noted. IMPRESSION: New masslike opacity in medial left upper lobe, highly suspicious for bronchogenic carcinoma. Chest CT with contrast is recommended for further evaluation. Severe bullous emphysema. Electronically Signed   By: Marlaine Hind M.D.   On: 10/31/2022 09:50    Pending Labs Unresulted Labs (From admission, onward)     Start     Ordered   10/31/22 1020  TSH  Once,   URGENT        10/31/22 1019   10/31/22 0904  HIV Antibody (routine testing w rflx)  (HIV Antibody (Routine testing w reflex) panel)  Once,   URGENT        10/31/22 0903            Vitals/Pain Today's Vitals   10/31/22 1252 10/31/22 1315 10/31/22 1400 10/31/22 1507  BP: 101/73 105/75 96/72 105/71  Pulse: (!) 107 (!) 106 99 100  Resp: 17 14 14 18   Temp: 98.5 F (36.9 C)   98.1 F (36.7 C)  TempSrc: Oral   Oral  SpO2: 96% 94% 96% 96%  Weight:      Height:      PainSc:        Isolation Precautions No active isolations  Medications Medications  acetaminophen (TYLENOL) tablet 650 mg (has no administration in time range)    Or  acetaminophen (TYLENOL) suppository 650 mg (has no administration in time range)  ondansetron (ZOFRAN) tablet 4 mg (has no administration in time range)    Or   ondansetron (ZOFRAN) injection 4 mg (has no administration in time range)  0.9 %  sodium chloride infusion ( Intravenous New Bag/Given 10/31/22 1503)  ondansetron (ZOFRAN-ODT) disintegrating tablet 8 mg (8 mg Oral Given 10/31/22 0921)  sodium chloride 0.9 % bolus 500 mL (0 mLs Intravenous Stopped 10/31/22 1431)  iohexol (OMNIPAQUE) 300 MG/ML solution 75 mL (75 mLs Intravenous Contrast Given 10/31/22 1225)    Mobility walks Low fall risk      R Recommendations: See Admitting Provider Note  Report given to:  Altekruse RN

## 2022-11-01 ENCOUNTER — Telehealth: Payer: Self-pay | Admitting: Pulmonary Disease

## 2022-11-01 ENCOUNTER — Encounter: Payer: Self-pay | Admitting: Pulmonary Disease

## 2022-11-01 DIAGNOSIS — R918 Other nonspecific abnormal finding of lung field: Secondary | ICD-10-CM | POA: Diagnosis not present

## 2022-11-01 LAB — TSH: TSH: 1.048 u[IU]/mL (ref 0.350–4.500)

## 2022-11-01 MED ORDER — GUAIFENESIN 100 MG/5ML PO LIQD: 5.0000 mL | ORAL | Status: DC | PRN

## 2022-11-01 MED ORDER — OSELTAMIVIR PHOSPHATE 30 MG PO CAPS: 30.0000 mg | ORAL_CAPSULE | Freq: Two times a day (BID) | ORAL | 0 refills | Status: AC

## 2022-11-01 MED ORDER — METOPROLOL TARTRATE 5 MG/5ML IV SOLN: 5.0000 mg | INTRAVENOUS | Status: DC | PRN

## 2022-11-01 MED ORDER — IPRATROPIUM-ALBUTEROL 0.5-2.5 (3) MG/3ML IN SOLN: 3.0000 mL | RESPIRATORY_TRACT | Status: DC | PRN

## 2022-11-01 MED ORDER — OSELTAMIVIR PHOSPHATE 75 MG PO CAPS: 75.0000 mg | ORAL_CAPSULE | Freq: Every day | ORAL | Status: DC

## 2022-11-01 MED ORDER — SENNOSIDES-DOCUSATE SODIUM 8.6-50 MG PO TABS: 1.0000 | ORAL_TABLET | Freq: Every evening | ORAL | Status: DC | PRN

## 2022-11-01 MED ORDER — OSELTAMIVIR PHOSPHATE 75 MG PO CAPS
75.0000 mg | ORAL_CAPSULE | Freq: Once | ORAL | Status: AC
  Administered 2022-11-01: 75 mg via ORAL
  Filled 2022-11-01: qty 1

## 2022-11-01 MED ORDER — OSELTAMIVIR PHOSPHATE 30 MG PO CAPS: 30.0000 mg | ORAL_CAPSULE | Freq: Two times a day (BID) | ORAL | Status: DC

## 2022-11-01 MED ORDER — HYDRALAZINE HCL 20 MG/ML IJ SOLN: 10.0000 mg | INTRAMUSCULAR | Status: DC | PRN

## 2022-11-01 MED ORDER — TRAZODONE HCL 50 MG PO TABS: 50.0000 mg | ORAL_TABLET | Freq: Every evening | ORAL | Status: DC | PRN

## 2022-11-01 NOTE — Telephone Encounter (Signed)
Pt has been scheduled for 1/9 at 9:15 AM at [facility].   Scheduled with [facility]  Case ID: [id number]  Letter is being sent now, calling pt

## 2022-11-01 NOTE — Progress Notes (Signed)
PROGRESS NOTE    Elaine Gibson  SVX:[id number] DOB: 1975-11-29 DOA: 10/31/2022 PCP: Lujean Amel, MD   Brief Narrative:  46 year old with emphysema, hypothyroidism, scoliosis, protein calorie malnutrition, pulmonary nodule comes to the hospital for unintentional weight loss, nausea and vomiting.  On admission patient was found to have influenza A positive, chest x-ray showed concerns of new mass in the medial left upper lobe highly concerning for carcinoma.  CT of the chest abdomen pelvis showed pleural-based left upper lobe mass suspicious for bronchogenic carcinoma measuring 4.7 X2.9X 4.6 cm with patchy opacity.  No further evidence of metastatic disease was identified.  Pulmonary team was consulted.   Assessment & Plan:  Principal Problem:   Mass of left lung Active Problems:   COPD (chronic obstructive pulmonary disease) (HCC)   Protein calorie malnutrition (HCC)   Normocytic anemia   Thrombocytosis    Mass of left lung This is a new diagnosis.  Pulmonary team consulted.  Patient will need bronchoscopy, timing per pulmonary team.  Eventually may benefit from outpatient PET scan.  Will make ambulatory referral to oncology.  Influenza A infection - On Tamiflu for 5 days.  Bronchodilators as needed.  Supportive care Discontinue Augmentin    COPD (chronic obstructive pulmonary disease) (HCC) Bronchodilators as needed.     Protein calorie malnutrition ([place]) Supportive care     Normocytic anemia In the setting of poor nutrition and cancer.     Thrombocytosis Secondary to anemia and cancer.         DVT prophylaxis: SCDs Start: 10/31/22 1432 Code Status: Full code Family Communication:    Patient to be seen by pulmonary team.  Will require bronchoscopy, timing of bronchoscopy per pulmonary team.  If this is deferred outpatient, she may be able to go home later today    Subjective: Seen and examined at bedside, still has coughing.  Over her oral intake is  poor.  Tells me she quit smoking about couple years ago.   Examination:  General exam: Appears calm and comfortable, cachectic frail with bilateral temporal wasting Respiratory system: Mild bilateral rhonchi Cardiovascular system: S1 & S2 heard, RRR. No JVD, murmurs, rubs, gallops or clicks. No pedal edema. Gastrointestinal system: Abdomen is nondistended, soft and nontender. No organomegaly or masses felt. Normal bowel sounds heard. Central nervous system: Alert and oriented. No focal neurological deficits. Extremities: Symmetric 5 x 5 power. Skin: No rashes, lesions or ulcers Psychiatry: Judgement and insight appear normal. Mood & affect appropriate.     Objective: Vitals:   10/31/22 2104 11/01/22 0115 11/01/22 0611 11/01/22 0700  BP: 97/66 96/62 94/62  (!) 85/52  Pulse: 100 100 (!) 117 (!) 102  Resp: 17 17 17 16   Temp: 98.6 F (37 C) 99.5 F (37.5 C) (!) 102.7 F (39.3 C) (!) 100.4 F (38 C)  TempSrc: Oral Oral Oral Oral  SpO2: 97% 99% 97% 98%  Weight:      Height:        Intake/Output Summary (Last 24 hours) at 11/01/2022 0816 Last data filed at 10/31/2022 1431 Gross per 24 hour  Intake 500 ml  Output --  Net 500 ml   Filed Weights   10/31/22 0846  Weight: 36.3 kg     Data Reviewed:   CBC: Recent Labs  Lab 10/31/22 1114  WBC 5.9  HGB 10.3*  HCT 33.6*  MCV 79.6*  PLT 233*   Basic Metabolic Panel: Recent Labs  Lab 10/31/22 1114  NA 135  K 4.6  CL 100  CO2 23  GLUCOSE 104*  BUN 28*  CREATININE 0.86  CALCIUM 8.9   GFR: Estimated Creatinine Clearance: 46.8 mL/min (by C-G formula based on SCr of 0.86 mg/dL). Liver Function Tests: Recent Labs  Lab 10/31/22 1114  AST 21  ALT 17  ALKPHOS 114  BILITOT 0.6  PROT 8.3*  ALBUMIN 3.1*   Recent Labs  Lab 10/31/22 1114  LIPASE 28   No results for input(s): "AMMONIA" in the last 168 hours. Coagulation Profile: No results for input(s): "INR", "PROTIME" in the last 168 hours. Cardiac  Enzymes: No results for input(s): "CKTOTAL", "CKMB", "CKMBINDEX", "TROPONINI" in the last 168 hours. BNP (last 3 results) No results for input(s): "PROBNP" in the last 8760 hours. HbA1C: No results for input(s): "HGBA1C" in the last 72 hours. CBG: No results for input(s): "GLUCAP" in the last 168 hours. Lipid Profile: No results for input(s): "CHOL", "HDL", "LDLCALC", "TRIG", "CHOLHDL", "LDLDIRECT" in the last 72 hours. Thyroid Function Tests: Recent Labs    10/31/22 1611  TSH 0.906   Anemia Panel: No results for input(s): "VITAMINB12", "FOLATE", "FERRITIN", "TIBC", "IRON", "RETICCTPCT" in the last 72 hours. Sepsis Labs: No results for input(s): "PROCALCITON", "LATICACIDVEN" in the last 168 hours.  Recent Results (from the past 240 hour(s))  Resp panel by RT-PCR (RSV, Flu A&B, Covid) Anterior Nasal Swab     Status: Abnormal   Collection Time: 10/31/22  9:20 AM   Specimen: Anterior Nasal Swab  Result Value Ref Range Status   SARS Coronavirus 2 by RT PCR NEGATIVE NEGATIVE Final    Comment: (NOTE) SARS-CoV-2 target nucleic acids are NOT DETECTED.  The SARS-CoV-2 RNA is generally detectable in upper respiratory specimens during the acute phase of infection. The lowest concentration of SARS-CoV-2 viral copies this assay can detect is 138 copies/mL. A negative result does not preclude SARS-Cov-2 infection and should not be used as the sole basis for treatment or other patient management decisions. A negative result may occur with  improper specimen collection/handling, submission of specimen other than nasopharyngeal swab, presence of viral mutation(s) within the areas targeted by this assay, and inadequate number of viral copies(<138 copies/mL). A negative result must be combined with clinical observations, patient history, and epidemiological information. The expected result is Negative.  Fact Sheet for Patients:  EntrepreneurPulse.com.au  Fact Sheet for  Healthcare Providers:  IncredibleEmployment.be  This test is no t yet approved or cleared by the [place] FDA and  has been authorized for detection and/or diagnosis of SARS-CoV-2 by FDA under an Emergency Use Authorization (EUA). This EUA will remain  in effect (meaning this test can be used) for the duration of the COVID-19 declaration under Section 564(b)(1) of the Act, 21 U.S.C.section 360bbb-3(b)(1), unless the authorization is terminated  or revoked sooner.       Influenza A by PCR POSITIVE (A) NEGATIVE Final   Influenza B by PCR NEGATIVE NEGATIVE Final    Comment: (NOTE) The Xpert Xpress SARS-CoV-2/FLU/RSV plus assay is intended as an aid in the diagnosis of influenza from Nasopharyngeal swab specimens and should not be used as a sole basis for treatment. Nasal washings and aspirates are unacceptable for Xpert Xpress SARS-CoV-2/FLU/RSV testing.  Fact Sheet for Patients: EntrepreneurPulse.com.au  Fact Sheet for Healthcare Providers: IncredibleEmployment.be  This test is not yet approved or cleared by the [place] FDA and has been authorized for detection and/or diagnosis of SARS-CoV-2 by FDA under an Emergency Use Authorization (EUA). This EUA will remain in effect (meaning this test can be  used) for the duration of the COVID-19 declaration under Section 564(b)(1) of the Act, 21 U.S.C. section 360bbb-3(b)(1), unless the authorization is terminated or revoked.     Resp Syncytial Virus by PCR NEGATIVE NEGATIVE Final    Comment: (NOTE) Fact Sheet for Patients: EntrepreneurPulse.com.au  Fact Sheet for Healthcare Providers: IncredibleEmployment.be  This test is not yet approved or cleared by the [place] FDA and has been authorized for detection and/or diagnosis of SARS-CoV-2 by FDA under an Emergency Use Authorization (EUA). This EUA will remain in effect (meaning  this test can be used) for the duration of the COVID-19 declaration under Section 564(b)(1) of the Act, 21 U.S.C. section 360bbb-3(b)(1), unless the authorization is terminated or revoked.  Performed at [facility], [place] [address], [place], [place] [address]          Radiology Studies: CT CHEST ABDOMEN PELVIS W CONTRAST  Result Date: 10/31/2022 CLINICAL DATA:  Lung mass. Assess for metastatic disease. Patient with nausea, diarrhea, headache and decreased oral in trach for several months. Intermittent cough. EXAM: CT CHEST, ABDOMEN, AND PELVIS WITH CONTRAST TECHNIQUE: Multidetector CT imaging of the chest, abdomen and pelvis was performed following the standard protocol during bolus administration of intravenous contrast. RADIATION DOSE REDUCTION: This exam was performed according to the departmental dose-optimization program which includes automated exposure control, adjustment of the mA and/or kV according to patient size and/or use of iterative reconstruction technique. CONTRAST:  33mL OMNIPAQUE IOHEXOL 300 MG/ML  SOLN COMPARISON:  Chest CT, 01/21/2020.  Current chest radiograph. FINDINGS: CT CHEST FINDINGS Cardiovascular: Heart normal in size. No coronary artery calcifications. No pericardial effusion. Normal great vessels. Mediastinum/Nodes: Enlarged heterogeneous left thyroid lobe with a discrete 2.2 cm nodule, other smaller nodules. This is similar to the appearance on the prior chest CT. Patient has had a thyroid biopsy, performed on 03/27/2019. No additional follow-up recommended. No neck base or mediastinal masses. Abnormal left upper hilar soft tissue consistent with enlarged lymph node, 1.6 cm in short axis. No other hilar adenopathy. No mediastinal adenopathy. Trachea and esophagus are unremarkable. Lungs/Pleura: Left upper lobe pleural based mass, 4.7 x 2.9 cm transversely 4.6 cm superior to inferior. This is new since the prior CT. No other lung masses. No  suspicious nodules. Patchy ground-glass type opacities noted in the left upper lobe mostly medial and posteriorly. Linear type right upper lobe opacities consistent with scarring stable from the prior CT. Emphysema with large right upper lobe bullae, unchanged. No pleural effusion or pneumothorax. Musculoskeletal: No fracture or acute finding. No osteoblastic or osteolytic lesions. Spine stabilization rods stable from the prior CT. No chest wall mass. CT ABDOMEN PELVIS FINDINGS Hepatobiliary: No focal liver abnormality is seen. No gallstones, gallbladder wall thickening, or biliary dilatation. Pancreas: Unremarkable. No pancreatic ductal dilatation or surrounding inflammatory changes. Spleen: Normal in size without focal abnormality. Adrenals/Urinary Tract: No adrenal masses. Kidneys normal in size, orientation and position with symmetric enhancement. No convincing renal mass, no stone and no hydronephrosis. Ureters not well visualized, but are grossly normal in course and in caliber. Bladder mostly decompressed, otherwise unremarkable. Stomach/Bowel: Bowel assessment limited due to lack of contrast and peritoneal fat. Allowing for this, stomach is unremarkable. Small bowel and colon are normal in caliber. No wall thickening or convincing inflammation. Vascular/Lymphatic: Aortic and iliac artery atherosclerotic calcifications. No aneurysm. No enlarged lymph nodes. Reproductive: Uterus and bilateral adnexa are unremarkable. Other: No abdominal wall hernia or abnormality. No abdominopelvic ascites. Musculoskeletal: No fracture or acute finding. No osteoblastic  or osteolytic lesions. Spine stabilization rods are stable from the prior CT. IMPRESSION: 1. Pleural based left upper lobe mass suspicious for bronchogenic carcinoma. This measures 4.7 x 2.9 x 4.6 cm. Tissue sampling recommended. 2. Patchy airspace opacities also noted in the left upper lobe, suspicious for multifocal pneumonia. 3. Enlarged superior left hilar  lymph node suspected to be metastatic disease. 4. No other evidence of metastatic disease in the chest. No evidence of metastatic disease in the abdomen or pelvis. 5. No acute abnormality in the abdomen or pelvis. 6. Emphysema with large right upper lobe bulla stable from the prior CT. Electronically Signed   By: Lajean Manes M.D.   On: 10/31/2022 13:06   CT HEAD W & WO CONTRAST (5MM)  Result Date: 10/31/2022 CLINICAL DATA:  Brain metastases suspected. EXAM: CT HEAD WITHOUT AND WITH CONTRAST TECHNIQUE: Contiguous axial images were obtained from the base of the skull through the vertex without and with intravenous contrast. RADIATION DOSE REDUCTION: This exam was performed according to the departmental dose-optimization program which includes automated exposure control, adjustment of the mA and/or kV according to patient size and/or use of iterative reconstruction technique. CONTRAST:  34mL OMNIPAQUE IOHEXOL 300 MG/ML  SOLN COMPARISON:  None Available. FINDINGS: Brain: No evidence of acute infarction, hemorrhage, hydrocephalus, extra-axial collection or mass lesion/mass effect. Left occipital DVA Vascular: No hyperdense vessel or unexpected calcification. Visible vessels are patent. Skull: Normal. Negative for fracture or focal lesion. Sinuses/Orbits: No acute finding. Other: None. IMPRESSION: No acute intracranial abnormality. No evidence of metastatic disease. Note that MRI is more sensitive for detection of small intracranial metastases. If there is high clinical suspicious, further evaluation with a contrast enhanced MRI of the brain should be considered. Electronically Signed   By: Marin Roberts M.D.   On: 10/31/2022 12:53   DG Chest 2 View  Result Date: 10/31/2022 CLINICAL DATA:  Cough and shortness of breath. EXAM: CHEST - 2 VIEW COMPARISON:  07/05/2021 FINDINGS: Heart size remains normal. Severe bullous emphysema is again seen with severe bullous disease in the right upper lobe. New masslike  opacity is seen in the medial left upper lobe measuring approximately 4 cm, highly suspicious for bronchogenic carcinoma. No evidence of pleural effusion. Thoracic spine posterior fixation rods again noted. IMPRESSION: New masslike opacity in medial left upper lobe, highly suspicious for bronchogenic carcinoma. Chest CT with contrast is recommended for further evaluation. Severe bullous emphysema. Electronically Signed   By: Marlaine Hind M.D.   On: 10/31/2022 09:50        Scheduled Meds:  amoxicillin-clavulanate  1 tablet Oral Q12H   metoCLOPramide (REGLAN) injection  5 mg Intravenous Q8H   pantoprazole (PROTONIX) IV  40 mg Intravenous Q24H   Continuous Infusions:  sodium chloride 50 mL/hr at 10/31/22 1503     LOS: 1 day   Time spent= 35 mins    Kleigh Hoelzer Arsenio Loader, MD [facility]  If 7PM-7AM, please contact night-coverage  11/01/2022, 8:16 AM

## 2022-11-01 NOTE — Consult Note (Signed)
NAME:  Elaine Gibson, MRN:  [id number], DOB:  Jan 14, 1976, LOS: 1 ADMISSION DATE:  10/31/2022, CONSULTATION DATE: 11/01/2022 REFERRING MD: Dr. Reesa Chew, CHIEF COMPLAINT: I feel sick    History of Present Illness:   This is a 46 year old female, presents to the emergency department with shortness of breath.  She has past medical history of emphysema, bullous disease, hypothyroidism.  Patient came to the emergency department was found to have influenza A.  She was started on Tamiflu.  She had a CT scan of the chest which revealed a new left upper lobe lung mass.  She has significant emphysema and was discharged with bronchodilators.  I discussed with her today her CT findings.  Since she is recovering from flu I think it is best that she gets better from this before bronchoscopy.  I do not think she has pneumonia.  The patient is on room air.  She was just feeling bad and that is why she came to the emergency department for evaluation.   Pertinent  Medical History   Past Medical History:  Diagnosis Date   Emphysema lung ([place])    Emphysema of lung ([place])    Hypothyroidism    tumors    Scoliosis      Significant Hospital Events: Including procedures, antibiotic start and stop dates in addition to other pertinent events     Interim History / Subjective:  Per HPI above  Objective   Blood pressure 96/67, pulse (!) 113, temperature 98.1 F (36.7 C), temperature source Oral, resp. rate 18, height 5\' 5"  (1.651 m), weight 36.3 kg, last menstrual period 10/28/2022, SpO2 95 %.        Intake/Output Summary (Last 24 hours) at 11/01/2022 1307 Last data filed at 10/31/2022 1431 Gross per 24 hour  Intake 500 ml  Output --  Net 500 ml   Filed Weights   10/31/22 0846  Weight: 36.3 kg    Examination: General: Middle-aged female resting in bed no distress HENT: NCAT, tracking appropriately Lungs: Clear to auscultation bilaterally, severely diminished breath sounds in the right compared  to the left Cardiovascular: Regular rhythm S1-S2 Abdomen: Soft, nontender nondistended Extremities: No significant edema Neuro: Alert oriented following commands GU: Deferred  Resolved Hospital Problem list     Assessment & Plan:   Left upper lobe lung mass This imaging on her CT is concerning for primary bronchogenic carcinoma She has had approximately a 20 to 25 pound weight loss over the past several months.  Severe bullous emphysema on CT imaging  Plan: I do not think she has pneumonia related to her influenza, she is feeling better.  She is on room air. Okay to complete 5 days of Tamiflu. She is planning to be discharged from the hospital today. We will have a PET scan complete hopefully prior to bronchoscopy. I have placed orders for outpatient navigational bronchoscopy to complete on November 15, 2022.  We appreciate the consultation.  Labs   CBC: Recent Labs  Lab 10/31/22 1114  WBC 5.9  HGB 10.3*  HCT 33.6*  MCV 79.6*  PLT 577*    Basic Metabolic Panel: Recent Labs  Lab 10/31/22 1114  NA 135  K 4.6  CL 100  CO2 23  GLUCOSE 104*  BUN 28*  CREATININE 0.86  CALCIUM 8.9   GFR: Estimated Creatinine Clearance: 46.8 mL/min (by C-G formula based on SCr of 0.86 mg/dL). Recent Labs  Lab 10/31/22 1114  WBC 5.9    Liver Function Tests: Recent Labs  Lab 10/31/22 1114  AST 21  ALT 17  ALKPHOS 114  BILITOT 0.6  PROT 8.3*  ALBUMIN 3.1*   Recent Labs  Lab 10/31/22 1114  LIPASE 28   No results for input(s): "AMMONIA" in the last 168 hours.  ABG    Component Value Date/Time   PHART 7.416 06/06/2019 1300   PCO2ART 37.3 06/06/2019 1300   PO2ART 77.9 (L) 06/06/2019 1300   HCO3 23.5 06/06/2019 1300   ACIDBASEDEF 0.4 06/06/2019 1300   O2SAT 96.7 06/06/2019 1300     Coagulation Profile: No results for input(s): "INR", "PROTIME" in the last 168 hours.  Cardiac Enzymes: No results for input(s): "CKTOTAL", "CKMB", "CKMBINDEX", "TROPONINI" in the  last 168 hours.  HbA1C: No results found for: "HGBA1C"  CBG: No results for input(s): "GLUCAP" in the last 168 hours.  Review of Systems:   Review of Systems  Constitutional:  Positive for chills, fever, malaise/fatigue and weight loss.  HENT:  Negative for hearing loss, sore throat and tinnitus.   Eyes:  Negative for blurred vision and double vision.  Respiratory:  Positive for cough. Negative for hemoptysis, sputum production, shortness of breath, wheezing and stridor.   Cardiovascular:  Negative for chest pain, palpitations, orthopnea, leg swelling and PND.  Gastrointestinal:  Negative for abdominal pain, constipation, diarrhea, heartburn, nausea and vomiting.  Genitourinary:  Negative for dysuria, hematuria and urgency.  Musculoskeletal:  Negative for joint pain and myalgias.  Skin:  Negative for itching and rash.  Neurological:  Negative for dizziness, tingling, weakness and headaches.  Endo/Heme/Allergies:  Negative for environmental allergies. Does not bruise/bleed easily.  Psychiatric/Behavioral:  Negative for depression. The patient is not nervous/anxious and does not have insomnia.   All other systems reviewed and are negative.    Past Medical History:  She,  has a past medical history of Emphysema lung ([place]), Emphysema of lung ([place]), Hypothyroidism, and Scoliosis.   Surgical History:   Past Surgical History:  Procedure Laterality Date   BACK SURGERY     CESAREAN SECTION       Social History:   reports that she quit smoking about 3 years ago. Her smoking use included cigars. She started smoking about 37 years ago. She has never used smokeless tobacco. She reports that she does not drink alcohol and does not use drugs.   Family History:  Her family history includes Gestational diabetes in her sister; Hypertension in her mother; Kidney disease in her mother; Stomach cancer in her father.   Allergies No Known Allergies   Home Medications  Prior to Admission  medications   Medication Sig Start Date End Date Taking? Authorizing Provider  acetaminophen (TYLENOL) 500 MG tablet Take 2 tablets (1,000 mg total) by mouth every 6 (six) hours as needed. Patient taking differently: Take 1,000 mg by mouth every 6 (six) hours as needed for mild pain. 03/27/21  Yes Fondaw, Wylder S, PA  albuterol (PROVENTIL HFA;VENTOLIN HFA) 108 (90 Base) MCG/ACT inhaler Inhale 2 puffs into the lungs daily. 12/13/18  Yes Lajean Manes, CNM  Fluticasone-Umeclidin-Vilant (TRELEGY ELLIPTA) 100-62.5-25 MCG/INH AEPB Inhale 1 puff into the lungs daily. 11/07/19  Yes Margaretha Seeds, MD  dicyclomine (BENTYL) 20 MG tablet Take 1 tablet (20 mg total) by mouth 2 (two) times daily. Patient not taking: Reported on 10/31/2022 03/27/21   Tedd Sias, PA  Fluticasone-Umeclidin-Vilant (TRELEGY ELLIPTA) 100-62.5-25 MCG/INH AEPB Inhale 1 puff into the lungs daily. Patient not taking: Reported on 03/27/2021 11/07/19   Margaretha Seeds, MD  ibuprofen (ADVIL) 800 MG tablet Take 1 tablet (800 mg total) by mouth every 6 (six) hours as needed. Patient not taking: Reported on 10/31/2022 03/27/21   Tedd Sias, PA  methocarbamol (ROBAXIN) 500 MG tablet Take 1 tablet (500 mg total) by mouth 2 (two) times daily. Patient not taking: Reported on 10/31/2022 03/27/21   Tedd Sias, PA  methocarbamol (ROBAXIN) 500 MG tablet Take 1 tablet (500 mg total) by mouth 2 (two) times daily. Patient not taking: Reported on 10/31/2022 07/26/22   Suzy Bouchard, PA-C  naproxen (NAPROSYN) 500 MG tablet Take 1 tablet (500 mg total) by mouth 2 (two) times daily. Patient not taking: Reported on 10/31/2022 07/26/22   Charmaine Downs C, PA-C  ondansetron (ZOFRAN ODT) 4 MG disintegrating tablet Take 1 tablet (4 mg total) by mouth every 8 (eight) hours as needed for nausea or vomiting. Patient not taking: Reported on 10/31/2022 03/27/21   Tedd Sias, PA  oseltamivir (TAMIFLU) 30 MG capsule Take 1 capsule  (30 mg total) by mouth 2 (two) times daily for 4 days. 11/01/22 11/05/22  Damita Lack, MD    Garner Nash, DO [facility] 11/01/2022 1:25 PM

## 2022-11-01 NOTE — Telephone Encounter (Signed)
Were we not able to secure the PET scan to be completed on 11/02/2022.?  I called and had the urgent prior authorization approved today.  Thanks  BLI

## 2022-11-01 NOTE — H&P (View-Only) (Signed)
NAME:  Elaine Gibson, MRN:  [id number], DOB:  [birth year], LOS: 1 ADMISSION DATE:  10/31/2022, CONSULTATION DATE: 11/01/2022 REFERRING MD: Dr. Reesa Chew, CHIEF COMPLAINT: I feel sick    History of Present Illness:   This is a 46 year old female, presents to the emergency department with shortness of breath.  She has past medical history of emphysema, bullous disease, hypothyroidism.  Patient came to the emergency department was found to have influenza A.  She was started on Tamiflu.  She had a CT scan of the chest which revealed a new left upper lobe lung mass.  She has significant emphysema and was discharged with bronchodilators.  I discussed with her today her CT findings.  Since she is recovering from flu I think it is best that she gets better from this before bronchoscopy.  I do not think she has pneumonia.  The patient is on room air.  She was just feeling bad and that is why she came to the emergency department for evaluation.   Pertinent  Medical History   Past Medical History:  Diagnosis Date   Emphysema lung ([place])    Emphysema of lung ([place])    Hypothyroidism    tumors    Scoliosis      Significant Hospital Events: Including procedures, antibiotic start and stop dates in addition to other pertinent events     Interim History / Subjective:  Per HPI above  Objective   Blood pressure 96/67, pulse (!) 113, temperature 98.1 F (36.7 C), temperature source Oral, resp. rate 18, height 5\' 5"  (1.651 m), weight 36.3 kg, last menstrual period 10/28/2022, SpO2 95 %.        Intake/Output Summary (Last 24 hours) at 11/01/2022 1307 Last data filed at 10/31/2022 1431 Gross per 24 hour  Intake 500 ml  Output --  Net 500 ml   Filed Weights   10/31/22 0846  Weight: 36.3 kg    Examination: General: Middle-aged female resting in bed no distress HENT: NCAT, tracking appropriately Lungs: Clear to auscultation bilaterally, severely diminished breath sounds in the right compared  to the left Cardiovascular: Regular rhythm S1-S2 Abdomen: Soft, nontender nondistended Extremities: No significant edema Neuro: Alert oriented following commands GU: Deferred  Resolved Hospital Problem list     Assessment & Plan:   Left upper lobe lung mass This imaging on her CT is concerning for primary bronchogenic carcinoma She has had approximately a 20 to 25 pound weight loss over the past several months.  Severe bullous emphysema on CT imaging  Plan: I do not think she has pneumonia related to her influenza, she is feeling better.  She is on room air. Okay to complete 5 days of Tamiflu. She is planning to be discharged from the hospital today. We will have a PET scan complete hopefully prior to bronchoscopy. I have placed orders for outpatient navigational bronchoscopy to complete on November 15, 2022.  We appreciate the consultation.  Labs   CBC: Recent Labs  Lab 10/31/22 1114  WBC 5.9  HGB 10.3*  HCT 33.6*  MCV 79.6*  PLT 577*    Basic Metabolic Panel: Recent Labs  Lab 10/31/22 1114  NA 135  K 4.6  CL 100  CO2 23  GLUCOSE 104*  BUN 28*  CREATININE 0.86  CALCIUM 8.9   GFR: Estimated Creatinine Clearance: 46.8 mL/min (by C-G formula based on SCr of 0.86 mg/dL). Recent Labs  Lab 10/31/22 1114  WBC 5.9    Liver Function Tests: Recent Labs  Lab 10/31/22 1114  AST 21  ALT 17  ALKPHOS 114  BILITOT 0.6  PROT 8.3*  ALBUMIN 3.1*   Recent Labs  Lab 10/31/22 1114  LIPASE 28   No results for input(s): "AMMONIA" in the last 168 hours.  ABG    Component Value Date/Time   PHART 7.416 06/06/2019 1300   PCO2ART 37.3 06/06/2019 1300   PO2ART 77.9 (L) 06/06/2019 1300   HCO3 23.5 06/06/2019 1300   ACIDBASEDEF 0.4 06/06/2019 1300   O2SAT 96.7 06/06/2019 1300     Coagulation Profile: No results for input(s): "INR", "PROTIME" in the last 168 hours.  Cardiac Enzymes: No results for input(s): "CKTOTAL", "CKMB", "CKMBINDEX", "TROPONINI" in the  last 168 hours.  HbA1C: No results found for: "HGBA1C"  CBG: No results for input(s): "GLUCAP" in the last 168 hours.  Review of Systems:   Review of Systems  Constitutional:  Positive for chills, fever, malaise/fatigue and weight loss.  HENT:  Negative for hearing loss, sore throat and tinnitus.   Eyes:  Negative for blurred vision and double vision.  Respiratory:  Positive for cough. Negative for hemoptysis, sputum production, shortness of breath, wheezing and stridor.   Cardiovascular:  Negative for chest pain, palpitations, orthopnea, leg swelling and PND.  Gastrointestinal:  Negative for abdominal pain, constipation, diarrhea, heartburn, nausea and vomiting.  Genitourinary:  Negative for dysuria, hematuria and urgency.  Musculoskeletal:  Negative for joint pain and myalgias.  Skin:  Negative for itching and rash.  Neurological:  Negative for dizziness, tingling, weakness and headaches.  Endo/Heme/Allergies:  Negative for environmental allergies. Does not bruise/bleed easily.  Psychiatric/Behavioral:  Negative for depression. The patient is not nervous/anxious and does not have insomnia.   All other systems reviewed and are negative.    Past Medical History:  She,  has a past medical history of Emphysema lung ([place]), Emphysema of lung ([place]), Hypothyroidism, and Scoliosis.   Surgical History:   Past Surgical History:  Procedure Laterality Date   BACK SURGERY     CESAREAN SECTION       Social History:   reports that she quit smoking about 3 years ago. Her smoking use included cigars. She started smoking about 37 years ago. She has never used smokeless tobacco. She reports that she does not drink alcohol and does not use drugs.   Family History:  Her family history includes Gestational diabetes in her sister; Hypertension in her mother; Kidney disease in her mother; Stomach cancer in her father.   Allergies No Known Allergies   Home Medications  Prior to Admission  medications   Medication Sig Start Date End Date Taking? Authorizing Provider  acetaminophen (TYLENOL) 500 MG tablet Take 2 tablets (1,000 mg total) by mouth every 6 (six) hours as needed. Patient taking differently: Take 1,000 mg by mouth every 6 (six) hours as needed for mild pain. 03/27/21  Yes Fondaw, Wylder S, PA  albuterol (PROVENTIL HFA;VENTOLIN HFA) 108 (90 Base) MCG/ACT inhaler Inhale 2 puffs into the lungs daily. 12/13/18  Yes Lajean Manes, CNM  Fluticasone-Umeclidin-Vilant (TRELEGY ELLIPTA) 100-62.5-25 MCG/INH AEPB Inhale 1 puff into the lungs daily. 11/07/19  Yes Margaretha Seeds, MD  dicyclomine (BENTYL) 20 MG tablet Take 1 tablet (20 mg total) by mouth 2 (two) times daily. Patient not taking: Reported on 10/31/2022 03/27/21   Tedd Sias, PA  Fluticasone-Umeclidin-Vilant (TRELEGY ELLIPTA) 100-62.5-25 MCG/INH AEPB Inhale 1 puff into the lungs daily. Patient not taking: Reported on 03/27/2021 11/07/19   Margaretha Seeds, MD  ibuprofen (ADVIL) 800 MG tablet Take 1 tablet (800 mg total) by mouth every 6 (six) hours as needed. Patient not taking: Reported on 10/31/2022 03/27/21   Tedd Sias, PA  methocarbamol (ROBAXIN) 500 MG tablet Take 1 tablet (500 mg total) by mouth 2 (two) times daily. Patient not taking: Reported on 10/31/2022 03/27/21   Tedd Sias, PA  methocarbamol (ROBAXIN) 500 MG tablet Take 1 tablet (500 mg total) by mouth 2 (two) times daily. Patient not taking: Reported on 10/31/2022 07/26/22   Suzy Bouchard, PA-C  naproxen (NAPROSYN) 500 MG tablet Take 1 tablet (500 mg total) by mouth 2 (two) times daily. Patient not taking: Reported on 10/31/2022 07/26/22   Charmaine Downs C, PA-C  ondansetron (ZOFRAN ODT) 4 MG disintegrating tablet Take 1 tablet (4 mg total) by mouth every 8 (eight) hours as needed for nausea or vomiting. Patient not taking: Reported on 10/31/2022 03/27/21   Tedd Sias, PA  oseltamivir (TAMIFLU) 30 MG capsule Take 1 capsule  (30 mg total) by mouth 2 (two) times daily for 4 days. 11/01/22 11/05/22  Damita Lack, MD    Garner Nash, DO [facility] 11/01/2022 1:25 PM

## 2022-11-01 NOTE — Telephone Encounter (Signed)
Attempted to call pt and inform her about her procedure and prep instructions.   Had to leave a vm to home phone: [phone] for pt to call back.  When I called [phone] I was unable to leave a vm

## 2022-11-01 NOTE — Progress Notes (Signed)
Discharged patient via wheelchair to home.  Reviewed patient medications and follow up appointments.  Patient had no further questions.  Provided patient with physician's note.

## 2022-11-01 NOTE — Telephone Encounter (Signed)
PCCM:  Patient was seen and evaluated during recent hospitalization.  She is recovering and feels better from influenza.  She has no significant respiratory complaints and is not on any oxygen.  I do not think she has pneumonia related to this.  She does however have an incidentally found right upper lobe lung mass.  We will let her some time to finish recovering from influenza and schedule her procedure for bronchoscopy in the future.  Tentative bronchoscopy date will be on November 15, 2021.  Orders have been placed.  Message routed to [facility] for scheduling and outpatient prior authorization.  [facility] 11/01/2022 1:15 PM

## 2022-11-01 NOTE — Discharge Summary (Signed)
Physician Discharge Summary  Elaine Gibson RKY:[id number] DOB: 1976-07-26 DOA: 10/31/2022  PCP: Lujean Amel, MD  Admit date: 10/31/2022 Discharge date: 11/01/2022  Admitted From: Home Disposition: Home  Recommendations for Outpatient Follow-up:  Follow up with PCP in 1-2 weeks Please obtain BMP/CBC in one week your next doctors visit.  Pulmonary to arrange for outpatient follow-up for bronchoscopy, tentatively planned for November 15, 2021 Ambulatory referral for oncology given.  Needs further workup likely including PET scan Tamiflu for 4 more days   Discharge Condition: Stable CODE STATUS: Full code Diet recommendation: Regular  Brief/Interim Summary: 46 year old with emphysema, hypothyroidism, scoliosis, protein calorie malnutrition, pulmonary nodule comes to the hospital for unintentional weight loss, nausea and vomiting.  On admission patient was found to have influenza A positive, chest x-ray showed concerns of new mass in the medial left upper lobe highly concerning for carcinoma.  CT of the chest abdomen pelvis showed pleural-based left upper lobe mass suspicious for bronchogenic carcinoma measuring 4.7 X2.9X 4.6 cm with patchy opacity.  No further evidence of metastatic disease was identified.  Pulmonary team was consulted. After discussion with pulmonary, it was determined to perform bronchoscopy on November 15, 2021.  This is not possible in the hospital as she currently has influenza A infection.  Rest recommendations as stated above     Mass of left lung This is a new diagnosis.  Pulmonary to schedule outpatient bronchoscopy on January 9.  A referral for oncology given.  Likely patient will require PET scan as well Influenza A infection - On Tamiflu for 5 days.  Bronchodilators as needed.  Supportive care Discontinue Augmentin     COPD (chronic obstructive pulmonary disease) (HCC) Bronchodilators as needed.     Protein calorie malnutrition ([place]) Supportive  care     Normocytic anemia In the setting of poor nutrition and cancer.     Thrombocytosis Secondary to anemia and cancer.        Discharge Diagnoses:  Principal Problem:   Mass of left lung Active Problems:   COPD (chronic obstructive pulmonary disease) (HCC)   Protein calorie malnutrition (HCC)   Normocytic anemia   Thrombocytosis      Consultations: Pulmonary  Subjective: Feels okay no complaints.  She wishes to go home  Discharge Exam: Vitals:   11/01/22 0929 11/01/22 1126  BP: (!) 82/53 96/67  Pulse: 100 (!) 113  Resp: 18 18  Temp: 98.9 F (37.2 C) 98.1 F (36.7 C)  SpO2: 99% 95%   Vitals:   11/01/22 0611 11/01/22 0700 11/01/22 0929 11/01/22 1126  BP: 94/62 (!) 85/52 (!) 82/53 96/67  Pulse: (!) 117 (!) 102 100 (!) 113  Resp: 17 16 18 18   Temp: (!) 102.7 F (39.3 C) (!) 100.4 F (38 C) 98.9 F (37.2 C) 98.1 F (36.7 C)  TempSrc: Oral Oral Oral Oral  SpO2: 97% 98% 99% 95%  Weight:      Height:        General: Pt is alert, awake, not in acute distress Cardiovascular: RRR, S1/S2 +, no rubs, no gallops Respiratory: CTA bilaterally, no wheezing, no rhonchi Abdominal: Soft, NT, ND, bowel sounds + Extremities: no edema, no cyanosis  Discharge Instructions  Discharge Instructions     Ambulatory referral to Hematology / Oncology   Complete by: As directed    Left upper lobe lung mass, suspicion for malignancy      Allergies as of 11/01/2022   No Known Allergies      Medication List  STOP taking these medications    dicyclomine 20 MG tablet Commonly known as: BENTYL   ibuprofen 800 MG tablet Commonly known as: ADVIL   methocarbamol 500 MG tablet Commonly known as: ROBAXIN   naproxen 500 MG tablet Commonly known as: NAPROSYN   ondansetron 4 MG disintegrating tablet Commonly known as: Zofran ODT       TAKE these medications    acetaminophen 500 MG tablet Commonly known as: TYLENOL Take 2 tablets (1,000 mg total) by  mouth every 6 (six) hours as needed. What changed: reasons to take this   albuterol 108 (90 Base) MCG/ACT inhaler Commonly known as: VENTOLIN HFA Inhale 2 puffs into the lungs daily.   oseltamivir 30 MG capsule Commonly known as: TAMIFLU Take 1 capsule (30 mg total) by mouth 2 (two) times daily for 4 days.   Trelegy Ellipta 100-62.5-25 MCG/ACT Aepb Generic drug: Fluticasone-Umeclidin-Vilant Inhale 1 puff into the lungs daily. What changed: Another medication with the same name was removed. Continue taking this medication, and follow the directions you see here.        No Known Allergies  You were cared for by a hospitalist during your hospital stay. If you have any questions about your discharge medications or the care you received while you were in the hospital after you are discharged, you can call the unit and asked to speak with the hospitalist on call if the hospitalist that took care of you is not available. Once you are discharged, your primary care physician will handle any further medical issues. Please note that no refills for any discharge medications will be authorized once you are discharged, as it is imperative that you return to your primary care physician (or establish a relationship with a primary care physician if you do not have one) for your aftercare needs so that they can reassess your need for medications and monitor your lab values.   Procedures/Studies: CT CHEST ABDOMEN PELVIS W CONTRAST  Result Date: 10/31/2022 CLINICAL DATA:  Lung mass. Assess for metastatic disease. Patient with nausea, diarrhea, headache and decreased oral in trach for several months. Intermittent cough. EXAM: CT CHEST, ABDOMEN, AND PELVIS WITH CONTRAST TECHNIQUE: Multidetector CT imaging of the chest, abdomen and pelvis was performed following the standard protocol during bolus administration of intravenous contrast. RADIATION DOSE REDUCTION: This exam was performed according to the  departmental dose-optimization program which includes automated exposure control, adjustment of the mA and/or kV according to patient size and/or use of iterative reconstruction technique. CONTRAST:  43mL OMNIPAQUE IOHEXOL 300 MG/ML  SOLN COMPARISON:  Chest CT, 01/21/2020.  Current chest radiograph. FINDINGS: CT CHEST FINDINGS Cardiovascular: Heart normal in size. No coronary artery calcifications. No pericardial effusion. Normal great vessels. Mediastinum/Nodes: Enlarged heterogeneous left thyroid lobe with a discrete 2.2 cm nodule, other smaller nodules. This is similar to the appearance on the prior chest CT. Patient has had a thyroid biopsy, performed on 03/27/2019. No additional follow-up recommended. No neck base or mediastinal masses. Abnormal left upper hilar soft tissue consistent with enlarged lymph node, 1.6 cm in short axis. No other hilar adenopathy. No mediastinal adenopathy. Trachea and esophagus are unremarkable. Lungs/Pleura: Left upper lobe pleural based mass, 4.7 x 2.9 cm transversely 4.6 cm superior to inferior. This is new since the prior CT. No other lung masses. No suspicious nodules. Patchy ground-glass type opacities noted in the left upper lobe mostly medial and posteriorly. Linear type right upper lobe opacities consistent with scarring stable from the prior CT. Emphysema with  large right upper lobe bullae, unchanged. No pleural effusion or pneumothorax. Musculoskeletal: No fracture or acute finding. No osteoblastic or osteolytic lesions. Spine stabilization rods stable from the prior CT. No chest wall mass. CT ABDOMEN PELVIS FINDINGS Hepatobiliary: No focal liver abnormality is seen. No gallstones, gallbladder wall thickening, or biliary dilatation. Pancreas: Unremarkable. No pancreatic ductal dilatation or surrounding inflammatory changes. Spleen: Normal in size without focal abnormality. Adrenals/Urinary Tract: No adrenal masses. Kidneys normal in size, orientation and position with  symmetric enhancement. No convincing renal mass, no stone and no hydronephrosis. Ureters not well visualized, but are grossly normal in course and in caliber. Bladder mostly decompressed, otherwise unremarkable. Stomach/Bowel: Bowel assessment limited due to lack of contrast and peritoneal fat. Allowing for this, stomach is unremarkable. Small bowel and colon are normal in caliber. No wall thickening or convincing inflammation. Vascular/Lymphatic: Aortic and iliac artery atherosclerotic calcifications. No aneurysm. No enlarged lymph nodes. Reproductive: Uterus and bilateral adnexa are unremarkable. Other: No abdominal wall hernia or abnormality. No abdominopelvic ascites. Musculoskeletal: No fracture or acute finding. No osteoblastic or osteolytic lesions. Spine stabilization rods are stable from the prior CT. IMPRESSION: 1. Pleural based left upper lobe mass suspicious for bronchogenic carcinoma. This measures 4.7 x 2.9 x 4.6 cm. Tissue sampling recommended. 2. Patchy airspace opacities also noted in the left upper lobe, suspicious for multifocal pneumonia. 3. Enlarged superior left hilar lymph node suspected to be metastatic disease. 4. No other evidence of metastatic disease in the chest. No evidence of metastatic disease in the abdomen or pelvis. 5. No acute abnormality in the abdomen or pelvis. 6. Emphysema with large right upper lobe bulla stable from the prior CT. Electronically Signed   By: Lajean Manes M.D.   On: 10/31/2022 13:06   CT HEAD W & WO CONTRAST (5MM)  Result Date: 10/31/2022 CLINICAL DATA:  Brain metastases suspected. EXAM: CT HEAD WITHOUT AND WITH CONTRAST TECHNIQUE: Contiguous axial images were obtained from the base of the skull through the vertex without and with intravenous contrast. RADIATION DOSE REDUCTION: This exam was performed according to the departmental dose-optimization program which includes automated exposure control, adjustment of the mA and/or kV according to patient size  and/or use of iterative reconstruction technique. CONTRAST:  7mL OMNIPAQUE IOHEXOL 300 MG/ML  SOLN COMPARISON:  None Available. FINDINGS: Brain: No evidence of acute infarction, hemorrhage, hydrocephalus, extra-axial collection or mass lesion/mass effect. Left occipital DVA Vascular: No hyperdense vessel or unexpected calcification. Visible vessels are patent. Skull: Normal. Negative for fracture or focal lesion. Sinuses/Orbits: No acute finding. Other: None. IMPRESSION: No acute intracranial abnormality. No evidence of metastatic disease. Note that MRI is more sensitive for detection of small intracranial metastases. If there is high clinical suspicious, further evaluation with a contrast enhanced MRI of the brain should be considered. Electronically Signed   By: Marin Roberts M.D.   On: 10/31/2022 12:53   DG Chest 2 View  Result Date: 10/31/2022 CLINICAL DATA:  Cough and shortness of breath. EXAM: CHEST - 2 VIEW COMPARISON:  07/05/2021 FINDINGS: Heart size remains normal. Severe bullous emphysema is again seen with severe bullous disease in the right upper lobe. New masslike opacity is seen in the medial left upper lobe measuring approximately 4 cm, highly suspicious for bronchogenic carcinoma. No evidence of pleural effusion. Thoracic spine posterior fixation rods again noted. IMPRESSION: New masslike opacity in medial left upper lobe, highly suspicious for bronchogenic carcinoma. Chest CT with contrast is recommended for further evaluation. Severe bullous emphysema. Electronically Signed  By: Marlaine Hind M.D.   On: 10/31/2022 09:50     The results of significant diagnostics from this hospitalization (including imaging, microbiology, ancillary and laboratory) are listed below for reference.     Microbiology: Recent Results (from the past 240 hour(s))  Resp panel by RT-PCR (RSV, Flu A&B, Covid) Anterior Nasal Swab     Status: Abnormal   Collection Time: 10/31/22  9:20 AM   Specimen: Anterior  Nasal Swab  Result Value Ref Range Status   SARS Coronavirus 2 by RT PCR NEGATIVE NEGATIVE Final    Comment: (NOTE) SARS-CoV-2 target nucleic acids are NOT DETECTED.  The SARS-CoV-2 RNA is generally detectable in upper respiratory specimens during the acute phase of infection. The lowest concentration of SARS-CoV-2 viral copies this assay can detect is 138 copies/mL. A negative result does not preclude SARS-Cov-2 infection and should not be used as the sole basis for treatment or other patient management decisions. A negative result may occur with  improper specimen collection/handling, submission of specimen other than nasopharyngeal swab, presence of viral mutation(s) within the areas targeted by this assay, and inadequate number of viral copies(<138 copies/mL). A negative result must be combined with clinical observations, patient history, and epidemiological information. The expected result is Negative.  Fact Sheet for Patients:  EntrepreneurPulse.com.au  Fact Sheet for Healthcare Providers:  IncredibleEmployment.be  This test is no t yet approved or cleared by the [place] FDA and  has been authorized for detection and/or diagnosis of SARS-CoV-2 by FDA under an Emergency Use Authorization (EUA). This EUA will remain  in effect (meaning this test can be used) for the duration of the COVID-19 declaration under Section 564(b)(1) of the Act, 21 U.S.C.section 360bbb-3(b)(1), unless the authorization is terminated  or revoked sooner.       Influenza A by PCR POSITIVE (A) NEGATIVE Final   Influenza B by PCR NEGATIVE NEGATIVE Final    Comment: (NOTE) The Xpert Xpress SARS-CoV-2/FLU/RSV plus assay is intended as an aid in the diagnosis of influenza from Nasopharyngeal swab specimens and should not be used as a sole basis for treatment. Nasal washings and aspirates are unacceptable for Xpert Xpress SARS-CoV-2/FLU/RSV testing.  Fact Sheet  for Patients: EntrepreneurPulse.com.au  Fact Sheet for Healthcare Providers: IncredibleEmployment.be  This test is not yet approved or cleared by the [place] FDA and has been authorized for detection and/or diagnosis of SARS-CoV-2 by FDA under an Emergency Use Authorization (EUA). This EUA will remain in effect (meaning this test can be used) for the duration of the COVID-19 declaration under Section 564(b)(1) of the Act, 21 U.S.C. section 360bbb-3(b)(1), unless the authorization is terminated or revoked.     Resp Syncytial Virus by PCR NEGATIVE NEGATIVE Final    Comment: (NOTE) Fact Sheet for Patients: EntrepreneurPulse.com.au  Fact Sheet for Healthcare Providers: IncredibleEmployment.be  This test is not yet approved or cleared by the [place] FDA and has been authorized for detection and/or diagnosis of SARS-CoV-2 by FDA under an Emergency Use Authorization (EUA). This EUA will remain in effect (meaning this test can be used) for the duration of the COVID-19 declaration under Section 564(b)(1) of the Act, 21 U.S.C. section 360bbb-3(b)(1), unless the authorization is terminated or revoked.  Performed at [facility], [place] [address], [place], [place] [address]      Labs: BNP (last 3 results) No results for input(s): "BNP" in the last 8760 hours. Basic Metabolic Panel: Recent Labs  Lab 10/31/22 1114  NA 135  K  4.6  CL 100  CO2 23  GLUCOSE 104*  BUN 28*  CREATININE 0.86  CALCIUM 8.9   Liver Function Tests: Recent Labs  Lab 10/31/22 1114  AST 21  ALT 17  ALKPHOS 114  BILITOT 0.6  PROT 8.3*  ALBUMIN 3.1*   Recent Labs  Lab 10/31/22 1114  LIPASE 28   No results for input(s): "AMMONIA" in the last 168 hours. CBC: Recent Labs  Lab 10/31/22 1114  WBC 5.9  HGB 10.3*  HCT 33.6*  MCV 79.6*  PLT 577*   Cardiac Enzymes: No results for input(s):  "CKTOTAL", "CKMB", "CKMBINDEX", "TROPONINI" in the last 168 hours. BNP: Invalid input(s): "POCBNP" CBG: No results for input(s): "GLUCAP" in the last 168 hours. D-Dimer No results for input(s): "DDIMER" in the last 72 hours. Hgb A1c No results for input(s): "HGBA1C" in the last 72 hours. Lipid Profile No results for input(s): "CHOL", "HDL", "LDLCALC", "TRIG", "CHOLHDL", "LDLDIRECT" in the last 72 hours. Thyroid function studies Recent Labs    11/01/22 0919  TSH 1.048   Anemia work up No results for input(s): "VITAMINB12", "FOLATE", "FERRITIN", "TIBC", "IRON", "RETICCTPCT" in the last 72 hours. Urinalysis    Component Value Date/Time   COLORURINE YELLOW 10/31/2022 1102   APPEARANCEUR CLEAR 10/31/2022 1102   LABSPEC >1.030 (H) 10/31/2022 1102   PHURINE 5.5 10/31/2022 1102   GLUCOSEU NEGATIVE 10/31/2022 1102   HGBUR TRACE (A) 10/31/2022 1102   BILIRUBINUR SMALL (A) 10/31/2022 1102   KETONESUR NEGATIVE 10/31/2022 1102   PROTEINUR 100 (A) 10/31/2022 1102   NITRITE NEGATIVE 10/31/2022 1102   LEUKOCYTESUR NEGATIVE 10/31/2022 1102   Sepsis Labs Recent Labs  Lab 10/31/22 1114  WBC 5.9   Microbiology Recent Results (from the past 240 hour(s))  Resp panel by RT-PCR (RSV, Flu A&B, Covid) Anterior Nasal Swab     Status: Abnormal   Collection Time: 10/31/22  9:20 AM   Specimen: Anterior Nasal Swab  Result Value Ref Range Status   SARS Coronavirus 2 by RT PCR NEGATIVE NEGATIVE Final    Comment: (NOTE) SARS-CoV-2 target nucleic acids are NOT DETECTED.  The SARS-CoV-2 RNA is generally detectable in upper respiratory specimens during the acute phase of infection. The lowest concentration of SARS-CoV-2 viral copies this assay can detect is 138 copies/mL. A negative result does not preclude SARS-Cov-2 infection and should not be used as the sole basis for treatment or other patient management decisions. A negative result may occur with  improper specimen collection/handling,  submission of specimen other than nasopharyngeal swab, presence of viral mutation(s) within the areas targeted by this assay, and inadequate number of viral copies(<138 copies/mL). A negative result must be combined with clinical observations, patient history, and epidemiological information. The expected result is Negative.  Fact Sheet for Patients:  EntrepreneurPulse.com.au  Fact Sheet for Healthcare Providers:  IncredibleEmployment.be  This test is no t yet approved or cleared by the [place] FDA and  has been authorized for detection and/or diagnosis of SARS-CoV-2 by FDA under an Emergency Use Authorization (EUA). This EUA will remain  in effect (meaning this test can be used) for the duration of the COVID-19 declaration under Section 564(b)(1) of the Act, 21 U.S.C.section 360bbb-3(b)(1), unless the authorization is terminated  or revoked sooner.       Influenza A by PCR POSITIVE (A) NEGATIVE Final   Influenza B by PCR NEGATIVE NEGATIVE Final    Comment: (NOTE) The Xpert Xpress SARS-CoV-2/FLU/RSV plus assay is intended as an aid in the diagnosis of  influenza from Nasopharyngeal swab specimens and should not be used as a sole basis for treatment. Nasal washings and aspirates are unacceptable for Xpert Xpress SARS-CoV-2/FLU/RSV testing.  Fact Sheet for Patients: EntrepreneurPulse.com.au  Fact Sheet for Healthcare Providers: IncredibleEmployment.be  This test is not yet approved or cleared by the [place] FDA and has been authorized for detection and/or diagnosis of SARS-CoV-2 by FDA under an Emergency Use Authorization (EUA). This EUA will remain in effect (meaning this test can be used) for the duration of the COVID-19 declaration under Section 564(b)(1) of the Act, 21 U.S.C. section 360bbb-3(b)(1), unless the authorization is terminated or revoked.     Resp Syncytial Virus by PCR  NEGATIVE NEGATIVE Final    Comment: (NOTE) Fact Sheet for Patients: EntrepreneurPulse.com.au  Fact Sheet for Healthcare Providers: IncredibleEmployment.be  This test is not yet approved or cleared by the [place] FDA and has been authorized for detection and/or diagnosis of SARS-CoV-2 by FDA under an Emergency Use Authorization (EUA). This EUA will remain in effect (meaning this test can be used) for the duration of the COVID-19 declaration under Section 564(b)(1) of the Act, 21 U.S.C. section 360bbb-3(b)(1), unless the authorization is terminated or revoked.  Performed at [facility], [place] [address] [place], [place] [address]      Time coordinating discharge:  I have spent 35 minutes face to face with the patient and on the ward discussing the patients care, assessment, plan and disposition with other care givers. >50% of the time was devoted counseling the patient about the risks and benefits of treatment/Discharge disposition and coordinating care.   SIGNED:   Damita Lack, MD  [facility] 11/01/2022, 12:17 PM   If 7PM-7AM, please contact night-coverage

## 2022-11-02 ENCOUNTER — Inpatient Hospital Stay (HOSPITAL_COMMUNITY): Admit: 2022-11-02 | Payer: BC Managed Care – PPO

## 2022-11-04 ENCOUNTER — Ambulatory Visit (HOSPITAL_COMMUNITY)
Admission: RE | Admit: 2022-11-04 | Discharge: 2022-11-04 | Disposition: A | Discharge status: Home / Self Care | Payer: BC Managed Care – PPO | Source: Ambulatory Visit | Attending: Pulmonary Disease | Admitting: Pulmonary Disease

## 2022-11-04 ENCOUNTER — Other Ambulatory Visit: Payer: Self-pay

## 2022-11-04 DIAGNOSIS — R918 Other nonspecific abnormal finding of lung field: Secondary | ICD-10-CM | POA: Diagnosis present

## 2022-11-04 DIAGNOSIS — R911 Solitary pulmonary nodule: Secondary | ICD-10-CM

## 2022-11-04 LAB — GLUCOSE, CAPILLARY: Glucose-Capillary: 85 mg/dL (ref 70–99)

## 2022-11-04 MED ORDER — FLUDEOXYGLUCOSE F - 18 (FDG) INJECTION
5.0000 | Freq: Once | INTRAVENOUS | Status: AC
  Administered 2022-11-04: 4.95 via INTRAVENOUS

## 2022-11-04 NOTE — Progress Notes (Signed)
Introductory phone call. I introduced myself as the Thoracic Nurse Navigator and explained my role in the context of her cancer care. I verified the pt is aware of the location of the cancer center and reminded her of the date and time of her appointment, as well as her appointment to have her labs drawn. I asked if someone will be attending the appt with her, and she asked if it was required to have someone with her at the appt. I stated that it is not required to have someone at the appt, but sometimes it is good to have a second pair of ears in the room because in these types of appts, sometimes not all the information is absorbed. I told the pt I will provide educational materials and information on other information on services offered by the hospital. I asked the pt if she had any questions or concerns at this time, and pt verbalized that she did not. I will see the pt at her new pt appt on 1/3 at 11:45.

## 2022-11-09 ENCOUNTER — Other Ambulatory Visit: Payer: BC Managed Care – PPO

## 2022-11-09 ENCOUNTER — Ambulatory Visit: Payer: BC Managed Care – PPO | Admitting: Internal Medicine

## 2022-11-09 ENCOUNTER — Telehealth: Payer: Self-pay | Admitting: Medical Oncology

## 2022-11-09 NOTE — Telephone Encounter (Signed)
Pt appt -no show, No answer on home or mobile phone. No VM.

## 2022-11-11 ENCOUNTER — Telehealth: Payer: Self-pay | Admitting: Pulmonary Disease

## 2022-11-11 NOTE — Telephone Encounter (Signed)
Spoke with patient in regards to bronch procedure advised she could call [facility] and possibly sent up a payment plan. I did speak with Elaine Gibson she stated this procedure did not require a pre cert. Advised pt to call us back with any questions or concerns. Will leave this open.

## 2022-11-14 ENCOUNTER — Encounter (HOSPITAL_COMMUNITY): Payer: Self-pay | Admitting: Pulmonary Disease

## 2022-11-14 ENCOUNTER — Other Ambulatory Visit: Payer: Self-pay

## 2022-11-14 NOTE — Progress Notes (Signed)
PCP -Lujean Amel, MD  Chest x-ray - 10/31/22 EKG - 10/31/22  ERAS Protcol - NPO  COVID TEST- DOS  Anesthesia review: Y  Patient verbally denies any shortness of breath, fever, cough and chest pain during phone call   -------------  SDW INSTRUCTIONS given:  Your procedure is scheduled on 11/15/22.  Report to [facility] Main Entrance "A" at [place].M., and check in at the Admitting office.  Call this number if you have problems the morning of surgery:  [phone]   Remember:  Do not eat or drink after midnight the night before your surgery   Take these medicines the morning of surgery with A SIP OF WATER  albuterol (PROVENTIL HFA;VENTOLIN HFA)  Fluticasone-Umeclidin-Vilant (TRELEGY ELLIPTA)  acetaminophen (TYLENOL)-if needed  As of today, STOP taking any Aspirin (unless otherwise instructed by your surgeon) Aleve, Naproxen, Ibuprofen, Motrin, Advil, Goody's, BC's, all herbal medications, fish oil, and all vitamins.                      Do not wear jewelry, make up, or nail polish            Do not wear lotions, powders, perfumes/colognes, or deodorant.            Do not shave 48 hours prior to surgery.  Men may shave face and neck.            Do not bring valuables to the hospital.            [facility] is not responsible for any belongings or valuables.  Do NOT Smoke (Tobacco/Vaping) 24 hours prior to your procedure If you use a CPAP at night, you may bring all equipment for your overnight stay.   Contacts, glasses, dentures or bridgework may not be worn into surgery.      For patients admitted to the hospital, discharge time will be determined by your treatment team.   Patients discharged the day of surgery will not be allowed to drive home, and someone needs to stay with them for 24 hours.    Special instructions:   [facility]- Preparing For Surgery  Before surgery, you can play an important role. Because skin is not sterile, your skin needs to be as free of  germs as possible. You can reduce the number of germs on your skin by washing with CHG (chlorahexidine gluconate) Soap before surgery.  CHG is an antiseptic cleaner which kills germs and bonds with the skin to continue killing germs even after washing.    Oral Hygiene is also important to reduce your risk of infection.  Remember - BRUSH YOUR TEETH THE MORNING OF SURGERY WITH YOUR REGULAR TOOTHPASTE  Please do not use if you have an allergy to CHG or antibacterial soaps. If your skin becomes reddened/irritated stop using the CHG.  Do not shave (including legs and underarms) for at least 48 hours prior to first CHG shower. It is OK to shave your face.  Please follow these instructions carefully.   Shower the NIGHT BEFORE SURGERY and the MORNING OF SURGERY with DIAL Soap.   Pat yourself dry with a CLEAN TOWEL.  Wear CLEAN PAJAMAS to bed the night before surgery  Place CLEAN SHEETS on your bed the night of your first shower and DO NOT SLEEP WITH PETS.   Day of Surgery: Please shower morning of surgery  Wear Clean/Comfortable clothing the morning of surgery Do not apply any deodorants/lotions.   Remember to brush  your teeth WITH YOUR REGULAR TOOTHPASTE.   Questions were answered. Patient verbalized understanding of instructions.

## 2022-11-15 ENCOUNTER — Ambulatory Visit (HOSPITAL_COMMUNITY): Payer: BC Managed Care – PPO | Admitting: Physician Assistant

## 2022-11-15 ENCOUNTER — Encounter (HOSPITAL_COMMUNITY)
Admission: RE | Disposition: A | Discharge status: Home / Self Care | Payer: Self-pay | Source: Home / Self Care | Attending: Pulmonary Disease

## 2022-11-15 ENCOUNTER — Encounter (HOSPITAL_COMMUNITY): Payer: Self-pay | Admitting: Pulmonary Disease

## 2022-11-15 ENCOUNTER — Ambulatory Visit (HOSPITAL_COMMUNITY): Payer: BC Managed Care – PPO

## 2022-11-15 ENCOUNTER — Ambulatory Visit (HOSPITAL_COMMUNITY)
Admission: RE | Admit: 2022-11-15 | Discharge: 2022-11-15 | Disposition: A | Discharge status: Home / Self Care | Payer: BC Managed Care – PPO | Attending: Pulmonary Disease | Admitting: Pulmonary Disease

## 2022-11-15 ENCOUNTER — Other Ambulatory Visit: Payer: Self-pay

## 2022-11-15 DIAGNOSIS — Z87891 Personal history of nicotine dependence: Secondary | ICD-10-CM | POA: Insufficient documentation

## 2022-11-15 DIAGNOSIS — J439 Emphysema, unspecified: Secondary | ICD-10-CM | POA: Insufficient documentation

## 2022-11-15 DIAGNOSIS — Z1152 Encounter for screening for COVID-19: Secondary | ICD-10-CM | POA: Diagnosis not present

## 2022-11-15 DIAGNOSIS — R918 Other nonspecific abnormal finding of lung field: Secondary | ICD-10-CM | POA: Diagnosis not present

## 2022-11-15 DIAGNOSIS — C3412 Malignant neoplasm of upper lobe, left bronchus or lung: Secondary | ICD-10-CM | POA: Insufficient documentation

## 2022-11-15 HISTORY — DX: Genetic carrier of other disease: Z14.8

## 2022-11-15 HISTORY — PX: VIDEO BRONCHOSCOPY WITH ENDOBRONCHIAL ULTRASOUND: SHX6177

## 2022-11-15 HISTORY — PX: BRONCHIAL BIOPSY: SHX5109

## 2022-11-15 HISTORY — DX: Dyspnea, unspecified: R06.00

## 2022-11-15 HISTORY — PX: BRONCHIAL NEEDLE ASPIRATION BIOPSY: SHX5106

## 2022-11-15 HISTORY — PX: FINE NEEDLE ASPIRATION: SHX5430

## 2022-11-15 LAB — POCT PREGNANCY, URINE: Preg Test, Ur: NEGATIVE

## 2022-11-15 LAB — SARS CORONAVIRUS 2 BY RT PCR: SARS Coronavirus 2 by RT PCR: NEGATIVE

## 2022-11-15 SURGERY — BRONCHOSCOPY, WITH BIOPSY USING ELECTROMAGNETIC NAVIGATION
Anesthesia: General | Laterality: Left

## 2022-11-15 MED ORDER — FENTANYL CITRATE (PF) 100 MCG/2ML IJ SOLN
INTRAMUSCULAR | Status: DC | PRN
  Administered 2022-11-15 (×2): 50 ug via INTRAVENOUS

## 2022-11-15 MED ORDER — LIDOCAINE 2% (20 MG/ML) 5 ML SYRINGE
INTRAMUSCULAR | Status: DC | PRN
  Administered 2022-11-15: 60 mg via INTRAVENOUS

## 2022-11-15 MED ORDER — FENTANYL CITRATE (PF) 100 MCG/2ML IJ SOLN: 25.0000 ug | INTRAMUSCULAR | Status: DC | PRN

## 2022-11-15 MED ORDER — ROCURONIUM BROMIDE 10 MG/ML (PF) SYRINGE
PREFILLED_SYRINGE | INTRAVENOUS | Status: DC | PRN
  Administered 2022-11-15: 50 mg via INTRAVENOUS

## 2022-11-15 MED ORDER — IPRATROPIUM-ALBUTEROL 0.5-2.5 (3) MG/3ML IN SOLN
3.0000 mL | Freq: Once | RESPIRATORY_TRACT | Status: AC
  Administered 2022-11-15: 3 mL via RESPIRATORY_TRACT

## 2022-11-15 MED ORDER — ONDANSETRON HCL 4 MG/2ML IJ SOLN
INTRAMUSCULAR | Status: DC | PRN
  Administered 2022-11-15: 4 mg via INTRAVENOUS

## 2022-11-15 MED ORDER — OXYCODONE HCL 5 MG/5ML PO SOLN: 5.0000 mg | Freq: Once | ORAL | Status: DC | PRN

## 2022-11-15 MED ORDER — CHLORHEXIDINE GLUCONATE 0.12 % MT SOLN
15.0000 mL | Freq: Once | OROMUCOSAL | Status: AC
  Administered 2022-11-15: 15 mL via OROMUCOSAL

## 2022-11-15 MED ORDER — FENTANYL CITRATE (PF) 100 MCG/2ML IJ SOLN
INTRAMUSCULAR | Status: AC
  Filled 2022-11-15: qty 2

## 2022-11-15 MED ORDER — PROPOFOL 10 MG/ML IV BOLUS
INTRAVENOUS | Status: DC | PRN
  Administered 2022-11-15: 130 mg via INTRAVENOUS

## 2022-11-15 MED ORDER — ONDANSETRON HCL 4 MG/2ML IJ SOLN: 4.0000 mg | Freq: Four times a day (QID) | INTRAMUSCULAR | Status: DC | PRN

## 2022-11-15 MED ORDER — PHENYLEPHRINE HCL-NACL 20-0.9 MG/250ML-% IV SOLN
INTRAVENOUS | Status: DC | PRN
  Administered 2022-11-15: 50 ug/min via INTRAVENOUS

## 2022-11-15 MED ORDER — SUGAMMADEX SODIUM 200 MG/2ML IV SOLN
INTRAVENOUS | Status: DC | PRN
  Administered 2022-11-15: 200 mg via INTRAVENOUS

## 2022-11-15 MED ORDER — IPRATROPIUM-ALBUTEROL 0.5-2.5 (3) MG/3ML IN SOLN
RESPIRATORY_TRACT | Status: AC
  Filled 2022-11-15: qty 3

## 2022-11-15 MED ORDER — OXYCODONE HCL 5 MG PO TABS: 5.0000 mg | ORAL_TABLET | Freq: Once | ORAL | Status: DC | PRN

## 2022-11-15 MED ORDER — LACTATED RINGERS IV SOLN: INTRAVENOUS | Status: DC

## 2022-11-15 MED ORDER — DEXAMETHASONE SODIUM PHOSPHATE 10 MG/ML IJ SOLN
INTRAMUSCULAR | Status: DC | PRN
  Administered 2022-11-15: 10 mg via INTRAVENOUS

## 2022-11-15 SURGICAL SUPPLY — 29 items
BRUSH CYTOL CELLEBRITY 1.5X140 (MISCELLANEOUS) IMPLANT
CANISTER SUCT 3000ML PPV (MISCELLANEOUS) ×3 IMPLANT
CONT SPEC 4OZ CLIKSEAL STRL BL (MISCELLANEOUS) ×3 IMPLANT
COVER BACK TABLE 60X90IN (DRAPES) ×3 IMPLANT
COVER DOME SNAP 22 D (MISCELLANEOUS) ×3 IMPLANT
FORCEPS BIOP RJ4 1.8 (CUTTING FORCEPS) IMPLANT
GAUZE SPONGE 4X4 12PLY STRL (GAUZE/BANDAGES/DRESSINGS) ×3 IMPLANT
GLOVE BIO SURGEON STRL SZ7.5 (GLOVE) ×3 IMPLANT
GOWN STRL REUS W/ TWL LRG LVL3 (GOWN DISPOSABLE) ×3 IMPLANT
GOWN STRL REUS W/TWL LRG LVL3 (GOWN DISPOSABLE) ×3
KIT CLEAN ENDO COMPLIANCE (KITS) ×6 IMPLANT
KIT TURNOVER KIT B (KITS) ×3 IMPLANT
MARKER SKIN DUAL TIP RULER LAB (MISCELLANEOUS) ×3 IMPLANT
NDL EBUS SONO TIP PENTAX (NEEDLE) ×3 IMPLANT
NEEDLE EBUS SONO TIP PENTAX (NEEDLE) ×3 IMPLANT
NS IRRIG 1000ML POUR BTL (IV SOLUTION) ×3 IMPLANT
OIL SILICONE PENTAX (PARTS (SERVICE/REPAIRS)) ×3 IMPLANT
PAD ARMBOARD 7.5X6 YLW CONV (MISCELLANEOUS) ×6 IMPLANT
SOL ANTI FOG 6CC (MISCELLANEOUS) ×3 IMPLANT
SYR 20CC LL (SYRINGE) ×6 IMPLANT
SYR 20ML ECCENTRIC (SYRINGE) ×6 IMPLANT
SYR 50ML SLIP (SYRINGE) IMPLANT
SYR 5ML LUER SLIP (SYRINGE) ×3 IMPLANT
TOWEL OR 17X24 6PK STRL BLUE (TOWEL DISPOSABLE) ×3 IMPLANT
TRAP SPECIMEN MUCOUS 40CC (MISCELLANEOUS) IMPLANT
TUBE CONNECTING 20X1/4 (TUBING) ×6 IMPLANT
UNDERPAD 30X30 (UNDERPADS AND DIAPERS) ×3 IMPLANT
VALVE DISPOSABLE (MISCELLANEOUS) ×3 IMPLANT
WATER STERILE IRR 1000ML POUR (IV SOLUTION) ×3 IMPLANT

## 2022-11-15 NOTE — Interval H&P Note (Signed)
History and Physical Interval Note:  11/15/2022 8:48 AM  Elaine Gibson  has presented today for surgery, with the diagnosis of lung mass.  The various methods of treatment have been discussed with the patient and family. After consideration of risks, benefits and other options for treatment, the patient has consented to  Procedure(s): ROBOTIC ASSISTED NAVIGATIONAL BRONCHOSCOPY (Left) VIDEO BRONCHOSCOPY WITH ENDOBRONCHIAL ULTRASOUND (Bilateral) as a surgical intervention.  The patient's history has been reviewed, patient examined, no change in status, stable for surgery.  I have reviewed the patient's chart and labs.  Questions were answered to the patient's satisfaction.     [place]

## 2022-11-15 NOTE — Research (Signed)
Title: A multi-center, prospective, single-arm, observational study to evaluate real-world outcomes for the shape-sensing Ion endoluminal system  Primary Outcome: Evaluate procedure characteristics and short and long-term patient outcomes following shape-sensing robotic-assisted bronchoscopy (ssRAB) utilizing the Ion Endoluminal System for lung lesion localization or biopsy.   Protocol # / Study Name: ISI-ION-003 Clinical Trials #: [id number] Sponsor: [place] Investigator: Dr. Leory Plowman Icard  Key Features of Ion Endoluminal System (referred to as "Ion") Ion is the first FDA cleared bronchoscopy system that uses fiber optic shape sensing technology to inform on location within the airways. Its catheter/tool channel has a smaller outer diameter (3.5 mm) in comparison to conventional bronchoscopes, allowing it to navigate into the smaller airways of the periphery.     Key Inclusion Criteria Subject is 18 years or older at the time of the procedure Subject is a candidate for a planned, elective RAB lung lesion localization or biopsy procedure in which the Ion Endoluminal System is planned to be utilized.  Subject  able to understand and adhere to study requirements and provide informed consent.   Key Exclusion Criteria Subject is under the care of a Herbalist and is unable to provide informed consent on their own accord.  Subject is participating in an interventional research study or research study with investigational agents with an unknown safety profile that would interfere with participation or the results of this study.  Female subjects who are pregnant or nursing at the time of the index Ion procedure, as determined by standard site practices. Subjects that are incarcerated or institutionalized under court order, or other vulnerable populations.    Previous Clinical Trials Since receiving FDA clearance in Feb 2019, Ion has been adopted  commercially by over 226 centers in the [place], and utilized in over 40,000 procedures.  The first in-human study enrolled 40 subjects with a mean lesion size of 14.8 mm and the overall diagnostic yield was 79.3%, with no adverse events. 17 (58.6%) lesions were reported to have a bronchus sign available on CT imaging.  A multi-center study published results in 2022, with 270 lesions biopsied in 241 patients using Ion. The mean largest cardinal lesion size was 18.86.8mm, and the mean airway generation count was 7.01.6. Asymptomatic pneumothorax occurred in 3.3% of subjects, and 0.8% experienced airway bleeding.   Another study provided preliminary results in 2022, with 87% sensitivity for malignancy, a diagnostic yield of 81%, and a mean lesion size of 16 mm. 75% of biopsy cases were bronchus-sign negative. 4% of subjects experienced pneumothoraces (including those requiring intervention), and 0.8% of subjects experienced airway bleeding requiring wedging or balloon tamponade.  A single-center study captured 131 consecutive procedures of pulmonary biopsy using Ion. The navigational success rate was 98.7%, with an overall diagnostic yield of 81.7%, an overall complication rate of 3%, and a pneumothorax rate of 1.5%.   [facility] [place] Coordinator note:   This visit for Subject Elaine Gibson with DOB: 05/20/1976 on 11/15/2022 for the above protocol is Visit/Encounter # Pre-procedure, Intra-procedure and post-procedure and is for purpose of research.   The consent for this encounter is under:  Protocol Version 1.0 Investigator Brochure Version N/A Consent Version Revision A, dated [id number] and is currently IRB approved.   Elaine Gibson expressed continued interest and consent in continuing as a study subject. Subject confirmed that there was no change in contact information (e.g. address, telephone, email). Subject thanked for participation in research and  contribution to science. In this  visit 11/15/2022 the subject will be evaluated by Principal Investigator named Dr. Valeta Harms. This research coordinator has verified that the above investigator is up to date with his/her training logs.   The Subject was informed that the PI continues to have oversight of the subject's visits and course through relevant discussions, reviews, and also specifically of this visit by routing of this note to the PI.  The research study was discussed with the subject in the pre-operative room. The study was explained in detail including all the contents of the informed consent document. The subject was encouraged to ask questions. All questions were answered to their satisfaction. The IRB approved informed consent was signed, and a copy was given to the subject. After obtaining consent, the subject underwent scheduled procedure using the ion endoluminal system. Data collection was completed per protocol. Refer to paper source subject binder for further details.      Signed by  Jose Persia Clinical Research Coordinator / Sub-Investigator  [facility]  [place], [place] 5:03 PM 11/15/2022

## 2022-11-15 NOTE — Transfer of Care (Signed)
Immediate Anesthesia Transfer of Care Note  Patient: Elaine Gibson  Procedure(s) Performed: ROBOTIC ASSISTED NAVIGATIONAL BRONCHOSCOPY (Left) VIDEO BRONCHOSCOPY WITH ENDOBRONCHIAL ULTRASOUND (Bilateral) BRONCHIAL BIOPSIES BRONCHIAL NEEDLE ASPIRATION BIOPSIES FINE NEEDLE ASPIRATION (FNA) LINEAR  Patient Location: PACU  Anesthesia Type:General  Level of Consciousness: awake, alert , and oriented  Airway & Oxygen Therapy: Patient Spontanous Breathing  Post-op Assessment: Report given to RN and Post -op Vital signs reviewed and stable  Post vital signs: Reviewed and stable  Last Vitals:  Vitals Value Taken Time  BP 130/53 11/15/22 1015  Temp    Pulse 110 11/15/22 1024  Resp 12 11/15/22 1024  SpO2 92 % 11/15/22 1024  Vitals shown include unvalidated device data.  Last Pain:  Vitals:   11/15/22 0802  TempSrc:   PainSc: 0-No pain         Complications: No notable events documented.

## 2022-11-15 NOTE — Discharge Instructions (Signed)
Flexible Bronchoscopy, Care After This sheet gives you information about how to care for yourself after your test. Your doctor may also give you more specific instructions. If you have problems or questions, contact your doctor. Follow these instructions at home: Eating and drinking Do not eat or drink anything (not even water) for 2 hours after your test, or until your numbing medicine (local anesthetic) wears off. When your numbness is gone and your cough and gag reflexes have come back, you may: Eat only soft foods. Slowly drink liquids. The day after the test, go back to your normal diet. Driving Do not drive for 24 hours if you were given a medicine to help you relax (sedative). Do not drive or use heavy machinery while taking prescription pain medicine. General instructions  Take over-the-counter and prescription medicines only as told by your doctor. Return to your normal activities as told. Ask what activities are safe for you. Do not use any products that have nicotine or tobacco in them. This includes cigarettes and e-cigarettes. If you need help quitting, ask your doctor. Keep all follow-up visits as told by your doctor. This is important. It is very important if you had a tissue sample (biopsy) taken. Get help right away if: You have shortness of breath that gets worse. You get light-headed. You feel like you are going to pass out (faint). You have chest pain. You cough up: More than a little blood. More blood than before. Summary Do not eat or drink anything (not even water) for 2 hours after your test, or until your numbing medicine wears off. Do not use cigarettes. Do not use e-cigarettes. Get help right away if you have chest pain.  This information is not intended to replace advice given to you by your health care provider. Make sure you discuss any questions you have with your health care provider. Document Released: 08/21/2009 Document Revised: 10/06/2017 Document  Reviewed: 11/11/2016 Elsevier Patient Education  2020 [employer].

## 2022-11-15 NOTE — Anesthesia Preprocedure Evaluation (Signed)
Anesthesia Evaluation  Patient identified by MRN, date of birth, ID band Patient awake    Reviewed: Allergy & Precautions, H&P , NPO status , Patient's Chart, lab work & pertinent test results  Airway Mallampati: II   Neck ROM: full    Dental   Pulmonary shortness of breath, COPD, former smoker Alpha-1 antitrypsin deficiency   breath sounds clear to auscultation       Cardiovascular negative cardio ROS  Rhythm:regular Rate:Normal     Neuro/Psych    GI/Hepatic   Endo/Other  Hypothyroidism    Renal/GU      Musculoskeletal   Abdominal   Peds  Hematology   Anesthesia Other Findings   Reproductive/Obstetrics                             Anesthesia Physical Anesthesia Plan  ASA: 3  Anesthesia Plan: General   Post-op Pain Management:    Induction: Intravenous  PONV Risk Score and Plan: 3 and Ondansetron, Dexamethasone, Midazolam and Treatment may vary due to age or medical condition  Airway Management Planned: Oral ETT  Additional Equipment:   Intra-op Plan:   Post-operative Plan: Extubation in OR  Informed Consent: I have reviewed the patients History and Physical, chart, labs and discussed the procedure including the risks, benefits and alternatives for the proposed anesthesia with the patient or authorized representative who has indicated his/her understanding and acceptance.     Dental advisory given  Plan Discussed with: CRNA, Anesthesiologist and Surgeon  Anesthesia Plan Comments:        Anesthesia Quick Evaluation

## 2022-11-15 NOTE — Op Note (Signed)
Video Bronchoscopy with Robotic Assisted Bronchoscopic Navigation  Video Bronchoscopy with Endobronchial Ultrasound Procedure Note  Date of Operation: 11/15/2022   Pre-op Diagnosis: Lung mass   Post-op Diagnosis: Lung mass   Surgeon: Garner Nash, DO   Assistants: None   Anesthesia: General endotracheal anesthesia  Operation: Flexible video fiberoptic bronchoscopy with robotic assistance and biopsies.  Estimated Blood Loss: Minimal  Complications: None  Indications and History: Elaine Gibson is a 47 y.o. female with history of Lung mass. The risks, benefits, complications, treatment options and expected outcomes were discussed with the patient.  The possibilities of pneumothorax, pneumonia, reaction to medication, pulmonary aspiration, perforation of a viscus, bleeding, failure to diagnose a condition and creating a complication requiring transfusion or operation were discussed with the patient who freely signed the consent.    Description of Procedure: The patient was seen in the Preoperative Area, was examined and was deemed appropriate to proceed.  The patient was taken to [facility] endoscopy room 3, identified as Elaine Gibson and the procedure verified as Flexible Video Fiberoptic Bronchoscopy.  A Time Out was held and the above information confirmed.   Prior to the date of the procedure a high-resolution CT scan of the chest was performed. Utilizing ION software program a virtual tracheobronchial tree was generated to allow the creation of distinct navigation pathways to the patient's parenchymal abnormalities. After being taken to the operating room general anesthesia was initiated and the patient  was orally intubated. The video fiberoptic bronchoscope was introduced via the endotracheal tube and a general inspection was performed which showed normal right and left lung anatomy, aspiration of the bilateral mainstems was completed to remove any remaining secretions. Robotic  catheter inserted into patient's endotracheal tube.   Target #1 left upper lobe: The distinct navigation pathways prepared prior to this procedure were then utilized to navigate to patient's lesion identified on CT scan. The robotic catheter was secured into place and the vision probe was withdrawn.  Lesion location was approximated using fluoroscopy. Under fluoroscopic guidance, transbronchial needle biopsies, and transbronchial forceps biopsies were performed to be sent for cytology and pathology.  Target #2 10L lymph node: The standard scope was then withdrawn and the endobronchial ultrasound was used to identify and characterize the peritracheal, hilar and bronchial lymph nodes. Inspection showed normal right and left lung anatomy negative for endobronchial lesion. Using real-time ultrasound guidance Wang needle biopsies were take from Station 10L nodes and were sent for cytology. The patient tolerated the procedure well without apparent complications. There was no significant blood loss. The bronchoscope was withdrawn. Anesthesia was reversed and the patient was taken to the PACU for recovery.   At the end of the procedure a general airway inspection was performed and there was no evidence of active bleeding. The bronchoscope was removed.  The patient tolerated the procedure well. There was no significant blood loss and there were no obvious complications. A post-procedural chest x-ray is pending.  Samples Target #1: 1. Transbronchial Wang needle biopsies from LUL 2. Transbronchial forceps biopsies from LUL  Samples Target #2: 1. Wang needle biopsies from 10L node  Plans:  The patient will be discharged from the PACU to home when recovered from anesthesia and after chest x-ray is reviewed. We will review the cytology, pathology results with the patient when they become available. Outpatient followup will be with Garner Nash, DO.    Garner Nash, DO [facility] 11/15/2022 10:22 AM

## 2022-11-15 NOTE — Anesthesia Procedure Notes (Signed)
Procedure Name: Intubation Date/Time: 11/15/2022 9:11 AM  Performed by: Colin Benton, CRNAPre-anesthesia Checklist: Patient identified, Emergency Drugs available, Suction available and Patient being monitored Patient Re-evaluated:Patient Re-evaluated prior to induction Oxygen Delivery Method: Circle system utilized Preoxygenation: Pre-oxygenation with 100% oxygen Induction Type: IV induction Ventilation: Mask ventilation without difficulty Laryngoscope Size: Miller and 2 Grade View: Grade I Tube type: Oral Tube size: 8.5 mm Number of attempts: 2 Airway Equipment and Method: Stylet Placement Confirmation: ETT inserted through vocal cords under direct vision, positive ETCO2 and breath sounds checked- equal and bilateral Secured at: 22 cm Tube secured with: Tape Dental Injury: Teeth and Oropharynx as per pre-operative assessment  Comments: DL x 1 with Mil 3. ETT slipped below cords.  DL x 2 with Mil 2 and grade 1 view.  EBBS and VSS

## 2022-11-16 ENCOUNTER — Telehealth: Payer: Self-pay | Admitting: Pulmonary Disease

## 2022-11-16 DIAGNOSIS — J439 Emphysema, unspecified: Secondary | ICD-10-CM

## 2022-11-16 MED ORDER — TRELEGY ELLIPTA 100-62.5-25 MCG/ACT IN AEPB: 1.0000 | INHALATION_SPRAY | Freq: Every day | RESPIRATORY_TRACT | 0 refills | Status: DC

## 2022-11-16 MED ORDER — ALBUTEROL SULFATE HFA 108 (90 BASE) MCG/ACT IN AERS: 2.0000 | INHALATION_SPRAY | Freq: Every day | RESPIRATORY_TRACT | 0 refills | Status: DC

## 2022-11-16 NOTE — Telephone Encounter (Signed)
Routing to Dr. Valeta Harms as high priority now as pt has called back again.

## 2022-11-16 NOTE — Telephone Encounter (Signed)
Rx for both Trelegy and albuterol inhaler have been sent to preferred pharmacy for pt.   Attempted to call pt to let her know that the meds were refilled but unable to reach and unable to leave VM due to mailbox not being set up yet.  Sent pt a mychart message letting her know this was done.

## 2022-11-16 NOTE — Anesthesia Postprocedure Evaluation (Signed)
Anesthesia Post Note  Patient: Elaine Gibson  Procedure(s) Performed: ROBOTIC ASSISTED NAVIGATIONAL BRONCHOSCOPY (Left) VIDEO BRONCHOSCOPY WITH ENDOBRONCHIAL ULTRASOUND (Bilateral) BRONCHIAL BIOPSIES BRONCHIAL NEEDLE ASPIRATION BIOPSIES FINE NEEDLE ASPIRATION (FNA) LINEAR     Patient location during evaluation: PACU Anesthesia Type: General Level of consciousness: awake and alert Pain management: pain level controlled Vital Signs Assessment: post-procedure vital signs reviewed and stable Respiratory status: spontaneous breathing, nonlabored ventilation, respiratory function stable and patient connected to nasal cannula oxygen Cardiovascular status: blood pressure returned to baseline and stable Postop Assessment: no apparent nausea or vomiting Anesthetic complications: no   No notable events documented.  Last Vitals:  Vitals:   11/15/22 1045 11/15/22 1100  BP: 94/60 (!) 95/59  Pulse: 98 96  Resp: 16 14  Temp:  36.8 C  SpO2: 100% 100%    Last Pain:  Vitals:   11/15/22 1100  TempSrc:   PainSc: 0-No pain                 Anaiah Mcmannis S

## 2022-11-16 NOTE — Telephone Encounter (Signed)
Pt called back to check on status of refills. Please call with an update.

## 2022-11-16 NOTE — Telephone Encounter (Signed)
Please advise, since patient was seen yesterday for a procedure would you like to refill trelegy and albuterol

## 2022-11-17 LAB — CYTOLOGY - NON PAP

## 2022-11-18 ENCOUNTER — Other Ambulatory Visit (HOSPITAL_COMMUNITY): Payer: BC Managed Care – PPO

## 2022-11-18 ENCOUNTER — Encounter (HOSPITAL_COMMUNITY): Payer: Self-pay | Admitting: Pulmonary Disease

## 2022-11-21 ENCOUNTER — Inpatient Hospital Stay: Payer: BC Managed Care – PPO | Attending: Internal Medicine

## 2022-11-21 ENCOUNTER — Inpatient Hospital Stay (HOSPITAL_BASED_OUTPATIENT_CLINIC_OR_DEPARTMENT_OTHER): Payer: BC Managed Care – PPO | Admitting: Internal Medicine

## 2022-11-21 VITALS — BP 104/63 | HR 108 | Temp 98.9°F | Resp 16 | Wt 85.3 lb

## 2022-11-21 DIAGNOSIS — Z5111 Encounter for antineoplastic chemotherapy: Secondary | ICD-10-CM | POA: Insufficient documentation

## 2022-11-21 DIAGNOSIS — C3492 Malignant neoplasm of unspecified part of left bronchus or lung: Secondary | ICD-10-CM | POA: Diagnosis not present

## 2022-11-21 DIAGNOSIS — J439 Emphysema, unspecified: Secondary | ICD-10-CM | POA: Insufficient documentation

## 2022-11-21 DIAGNOSIS — C3412 Malignant neoplasm of upper lobe, left bronchus or lung: Secondary | ICD-10-CM | POA: Insufficient documentation

## 2022-11-21 DIAGNOSIS — Z79899 Other long term (current) drug therapy: Secondary | ICD-10-CM | POA: Diagnosis not present

## 2022-11-21 DIAGNOSIS — R000 Tachycardia, unspecified: Secondary | ICD-10-CM | POA: Insufficient documentation

## 2022-11-21 DIAGNOSIS — R911 Solitary pulmonary nodule: Secondary | ICD-10-CM

## 2022-11-21 DIAGNOSIS — R1013 Epigastric pain: Secondary | ICD-10-CM | POA: Diagnosis not present

## 2022-11-21 LAB — COMPREHENSIVE METABOLIC PANEL
ALT: 10 U/L (ref 0–44)
AST: 13 U/L — ABNORMAL LOW (ref 15–41)
Albumin: 3 g/dL — ABNORMAL LOW (ref 3.5–5.0)
Alkaline Phosphatase: 133 U/L — ABNORMAL HIGH (ref 38–126)
Anion gap: 5 (ref 5–15)
BUN: 12 mg/dL (ref 6–20)
CO2: 27 mmol/L (ref 22–32)
Calcium: 8.8 mg/dL — ABNORMAL LOW (ref 8.9–10.3)
Chloride: 103 mmol/L (ref 98–111)
Creatinine, Ser: 0.61 mg/dL (ref 0.44–1.00)
GFR, Estimated: >60 mL/min (ref >60)
Glucose, Bld: 93 mg/dL (ref 70–99)
Potassium: 3.7 mmol/L (ref 3.5–5.1)
Sodium: 135 mmol/L (ref 135–145)
Total Bilirubin: 0.3 mg/dL (ref 0.3–1.2)
Total Protein: 7 g/dL (ref 6.5–8.1)

## 2022-11-21 LAB — CBC WITH DIFFERENTIAL/PLATELET
Abs Immature Granulocytes: 0.04 10*3/uL (ref 0.00–0.07)
Basophils Absolute: 0 10*3/uL (ref 0.0–0.1)
Basophils Relative: 0 %
Eosinophils Absolute: 0.1 10*3/uL (ref 0.0–0.5)
Eosinophils Relative: 1 %
HCT: 23.9 % — ABNORMAL LOW (ref 36.0–46.0)
Hemoglobin: 7.5 g/dL — ABNORMAL LOW (ref 12.0–15.0)
Immature Granulocytes: 0 %
Lymphocytes Relative: 22 %
Lymphs Abs: 2.4 10*3/uL (ref 0.7–4.0)
MCH: 24.3 pg — ABNORMAL LOW (ref 26.0–34.0)
MCHC: 31.4 g/dL (ref 30.0–36.0)
MCV: 77.3 fL — ABNORMAL LOW (ref 80.0–100.0)
Monocytes Absolute: 1.2 10*3/uL — ABNORMAL HIGH (ref 0.1–1.0)
Monocytes Relative: 11 %
Neutro Abs: 7.3 10*3/uL (ref 1.7–7.7)
Neutrophils Relative %: 66 %
Platelets: 445 10*3/uL — ABNORMAL HIGH (ref 150–400)
RBC: 3.09 MIL/uL — ABNORMAL LOW (ref 3.87–5.11)
RDW: 14.7 % (ref 11.5–15.5)
WBC: 11.1 10*3/uL — ABNORMAL HIGH (ref 4.0–10.5)
nRBC: 0 % (ref 0.0–0.2)

## 2022-11-21 NOTE — Progress Notes (Signed)
START ON PATHWAY REGIMEN - Non-Small Cell Lung     A cycle is every 7 days, concurrent with RT:     Paclitaxel      Carboplatin   **Always confirm dose/schedule in your pharmacy ordering system**  Patient Characteristics: Preoperative or Nonsurgical Candidate (Clinical Staging), Stage II, Nonsurgical Candidate Therapeutic Status: Preoperative or Nonsurgical Candidate (Clinical Staging) AJCC T Category: cT2b AJCC N Category: cN1 AJCC M Category: cM0 AJCC 8 Stage Grouping: IIB Intent of Therapy: Curative Intent, Discussed with Patient

## 2022-11-21 NOTE — Progress Notes (Signed)
[facility] [phone]   [phone]  CONSULT NOTE  REFERRING PHYSICIAN: Dr. Elige Radon Icard  REASON FOR CONSULTATION:  47 years old African-American female recently diagnosed with lung cancer  HPI Elaine Gibson is a 47 y.o. female with past medical history significant for alpha 1 antitrypsin deficiency carrier, COPD, hypothyroidism as well as scoliosis.  The patient has a short history for smoking but quit on 05/04/2019.  The patient presented to the emergency department on October 31, 2022 complaining of weight loss, nausea and vomiting that has been going on for several weeks since she was diagnosed with influenza A in early December 2023.  She has significant weight loss during this.  Of around 25-30 pounds.  During her evaluation.  She had chest x-ray that showed new mass like opacity in the medial left upper lobe highly suspicious for bronchogenic carcinoma.  CT scan of the chest, abdomen and pelvis as well as CT scan of the head on October 27, 2022 showed 4.7 x 2.9 x 4.6 cm left upper lobe pleural-based mass new compared to previous imaging studies.  There was also enlarged heterogeneous left thyroid lobe with a discrete 2.2 cm nodule as well as other smaller nodules.  There was abnormal left upper lobe hilar soft tissue consistent with enlarged lymph node measuring 1.6 cm.  There was no evidence of metastatic disease in the chest, abdomen and pelvis.  CT of the head with and without contrast was negative for malignancy.  The patient had a PET scan on November 04, 2022 and that showed hypermetabolic 4.1 cm left upper lobe pulmonary nodule consistent with primary bronchogenic neoplasm.  There was hypermetabolic left hilar adenopathy consistent with nodal metastatic disease and no evidence of hypermetabolic distant metastatic disease.  There was a focus of FDG avidity in the ascending colon that is nonspecific.  The patient was seen by Dr. Tonia Brooms and on  November 15, 2022 she underwent video bronchoscopy with robotic assisted bronchoscopic navigation and EBUS under the care of Dr. Tonia Brooms.  The final pathology from the left upper lobe fine-needle aspiration ([id number] ) showed malignant cells consistent with non-small cell carcinoma with no clear subtype.  The fine-needle aspiration of the left hilar lymph node still in process. Dr. Tonia Brooms kindly referred the patient to me today for evaluation and recommendation regarding treatment of her condition. When seen today she continues to complain of increasing fatigue and weakness.  She is tired all the time.  She works third shift at a production job.  She lost around 30 pounds in the last few weeks but she started gaining weight back.  She has no current nausea, vomiting, diarrhea or constipation.  She has no chest pain but continues to have shortness of breath and mild cough with no hemoptysis.  She has no headache but has blurry vision. Family history significant for father with a stomach cancer and mother had scleroderma. The patient is married but separated.  She has 2 sons.  She lives in [place].  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  She has a history of smoking 1 pack/day for around 30 years and quit 3 years ago when she was diagnosed with emphysema.  She has no history of alcohol or drug abuse.  HPI  Past Medical History:  Diagnosis Date   Alpha-1-antitrypsin deficiency carrier    Dyspnea    Emphysema lung (HCC)    Emphysema of  lung (HCC)    Hypothyroidism    tumors    Scoliosis     Past Surgical History:  Procedure Laterality Date   BACK SURGERY     BRONCHIAL BIOPSY  11/15/2022   Procedure: BRONCHIAL BIOPSIES;  Surgeon: Josephine Igo, DO;  Location: [facility];  Service: Pulmonary;;   BRONCHIAL NEEDLE ASPIRATION BIOPSY  11/15/2022   Procedure: BRONCHIAL NEEDLE ASPIRATION BIOPSIES;  Surgeon: Josephine Igo, DO;  Location: [facility];   Service: Pulmonary;;   CESAREAN SECTION     FINE NEEDLE ASPIRATION  11/15/2022   Procedure: FINE NEEDLE ASPIRATION (FNA) LINEAR;  Surgeon: Josephine Igo, DO;  Location: [facility];  Service: Pulmonary;;   VIDEO BRONCHOSCOPY WITH ENDOBRONCHIAL ULTRASOUND Bilateral 11/15/2022   Procedure: VIDEO BRONCHOSCOPY WITH ENDOBRONCHIAL ULTRASOUND;  Surgeon: Josephine Igo, DO;  Location: [facility];  Service: Pulmonary;  Laterality: Bilateral;    Family History  Problem Relation Age of Onset   Hypertension Mother    Kidney disease Mother    Stomach cancer Father    Gestational diabetes Sister     Social History Social History   Tobacco Use   Smoking status: Former    Types: Cigars    Start date: 1987    Quit date: 05/04/2019    Years since quitting: 3.5   Smokeless tobacco: Never   Tobacco comments:    3 black and milds a day   Vaping Use   Vaping Use: Never used  Substance Use Topics   Alcohol use: No   Drug use: Never    No Known Allergies  Current Outpatient Medications  Medication Sig Dispense Refill   acetaminophen (TYLENOL) 500 MG tablet Take 2 tablets (1,000 mg total) by mouth every 6 (six) hours as needed. (Patient taking differently: Take 1,000 mg by mouth every 6 (six) hours as needed for mild pain.) 30 tablet 0   albuterol (VENTOLIN HFA) 108 (90 Base) MCG/ACT inhaler Inhale 2 puffs into the lungs daily. 1 each 0   Fluticasone-Umeclidin-Vilant (TRELEGY ELLIPTA) 100-62.5-25 MCG/ACT AEPB Inhale 1 puff into the lungs daily. 60 each 0   No current facility-administered medications for this visit.    Review of Systems  Constitutional: positive for anorexia, fatigue, and weight loss Eyes: negative Ears, nose, mouth, throat, and face: negative Respiratory: positive for cough and dyspnea on exertion Cardiovascular: negative Gastrointestinal: negative Genitourinary:negative Integument/breast: negative Hematologic/lymphatic:  negative Musculoskeletal:negative Neurological: positive for weakness Behavioral/Psych: negative Endocrine: negative Allergic/Immunologic: negative  Physical Exam  JXF:DZHPC, healthy, no distress, malnourished SKIN: skin color, texture, turgor are normal HEAD: Normocephalic, No masses, lesions, tenderness or abnormalities EYES: normal, PERRLA, Conjunctiva are pink and non-injected EARS: External ears normal OROPHARYNX:no exudate, no erythema, and lips, buccal mucosa, and tongue normal  NECK: supple, no adenopathy, no JVD LYMPH:  no palpable lymphadenopathy, no hepatosplenomegaly BREAST:not examined LUNGS: clear to auscultation , and palpation HEART: regular rate & rhythm, no murmurs, and no gallops ABDOMEN:abdomen soft, non-tender, normal bowel sounds, and no masses or organomegaly BACK: Back symmetric, no curvature., No CVA tenderness EXTREMITIES:no joint deformities, effusion, or inflammation, no edema  NEURO: alert & oriented x 3 with fluent speech, no focal motor/sensory deficits  PERFORMANCE STATUS: ECOG 1  LABORATORY DATA: Lab Results  Component Value Date   WBC 11.1 (H) 11/21/2022   HGB 7.5 (L) 11/21/2022   HCT 23.9 (L) 11/21/2022   MCV 77.3 (L) 11/21/2022   PLT 445 (H) 11/21/2022      Chemistry      Component  Value Date/Time   NA 135 11/21/2022 1339   K 3.7 11/21/2022 1339   CL 103 11/21/2022 1339   CO2 27 11/21/2022 1339   BUN 12 11/21/2022 1339   CREATININE 0.61 11/21/2022 1339      Component Value Date/Time   CALCIUM 8.8 (L) 11/21/2022 1339   ALKPHOS 133 (H) 11/21/2022 1339   AST 13 (L) 11/21/2022 1339   ALT 10 11/21/2022 1339   BILITOT 0.3 11/21/2022 1339       RADIOGRAPHIC STUDIES: DG Chest Port 1 View  Result Date: 11/15/2022 CLINICAL DATA:  Status post bronchoscopy. EXAM: PORTABLE CHEST 1 VIEW COMPARISON:  X-ray 10/31/2022. CT and PET-CT from December 2023 as well. FINDINGS: Hyperinflation with large bullous/bleb formation at the right upper  lung. Stable left upper lobe rounded opacity. Please correlate with prior workup. Apical pleural thickening. No pneumothorax or effusion. Stable cardiopericardial silhouette. No edema. Extensive spine hardware from Harrington rods. Overlapping cardiac leads. IMPRESSION: No pneumothorax post bronchoscopy. Electronically Signed   By: Karen Kays M.D.   On: 11/15/2022 10:53   DG C-ARM BRONCHOSCOPY  Result Date: 11/15/2022 C-ARM BRONCHOSCOPY: Fluoroscopy was utilized by the requesting physician.  No radiographic interpretation.   NM PET Image Initial (PI) Skull Base To Thigh (F-18 FDG)  Result Date: 11/07/2022 CLINICAL DATA:  Initial treatment strategy for pulmonary nodule. EXAM: NUCLEAR MEDICINE PET SKULL BASE TO THIGH TECHNIQUE: 4.95 mCi F-18 FDG was injected intravenously. Full-ring PET imaging was performed from the skull base to thigh after the radiotracer. CT data was obtained and used for attenuation correction and anatomic localization. Fasting blood glucose: 85 mg/dl COMPARISON:  CT chest abdomen pelvis October 31, 2022 FINDINGS: Mediastinal blood pool activity: SUV max 1.6 Liver activity: SUV max NA NECK: No hypermetabolic cervical adenopathy. Low-level hypermetabolic activity in a 2.2 cm left thyroid nodule with a max SUV of 2.5 previously evaluated with thyroid ultrasound. Incidental CT findings: None. CHEST: Hypermetabolic pulmonary nodule in the left lung apex measures 4.1 cm on image 11/9 with a max SUV of 36.7. Hypermetabolic left hilar adenopathy with a lymph node measuring proximally 13 mm in short axis on image 72/4 and a max SUV of 19.5. No hypermetabolic mediastinal, supraclavicular or right hilar adenopathy identified. Incidental CT findings: Bullous emphysematous change in the right lung. Similar reticular and ground-glass opacities in the lingula which do not demonstrate significant abnormal FDG avidity. ABDOMEN/PELVIS: Focus of nodular hypermetabolic activity in the ascending colon with  a max SUV of 4.7. No abnormal hypermetabolic activity within the liver, pancreas, adrenal glands, or spleen. No hypermetabolic lymph nodes in the abdomen or pelvis. Incidental CT findings: Aortic atherosclerosis. Tubal ligation clips. SKELETON: No focal hypermetabolic activity to suggest skeletal metastasis. Incidental CT findings: Posterior spinal stabilization rods. IMPRESSION: 1. Hypermetabolic 4.1 cm left upper lobe pulmonary nodule, consistent with primary bronchogenic neoplasm. 2. Hypermetabolic left hilar adenopathy, consistent with nodal metastatic disease. 3. No evidence of hypermetabolic distant metastatic disease. 4. Focus of FDG avidity in the ascending colon is nonspecific, consider further evaluation with colonoscopy. 5.  Aortic Atherosclerosis (ICD10-I70.0). Electronically Signed   By: Maudry Mayhew M.D.   On: 11/07/2022 13:20   CT CHEST ABDOMEN PELVIS W CONTRAST  Result Date: 10/31/2022 CLINICAL DATA:  Lung mass. Assess for metastatic disease. Patient with nausea, diarrhea, headache and decreased oral in trach for several months. Intermittent cough. EXAM: CT CHEST, ABDOMEN, AND PELVIS WITH CONTRAST TECHNIQUE: Multidetector CT imaging of the chest, abdomen and pelvis was performed following the standard protocol during  bolus administration of intravenous contrast. RADIATION DOSE REDUCTION: This exam was performed according to the departmental dose-optimization program which includes automated exposure control, adjustment of the mA and/or kV according to patient size and/or use of iterative reconstruction technique. CONTRAST:  66mL OMNIPAQUE IOHEXOL 300 MG/ML  SOLN COMPARISON:  Chest CT, 01/21/2020.  Current chest radiograph. FINDINGS: CT CHEST FINDINGS Cardiovascular: Heart normal in size. No coronary artery calcifications. No pericardial effusion. Normal great vessels. Mediastinum/Nodes: Enlarged heterogeneous left thyroid lobe with a discrete 2.2 cm nodule, other smaller nodules. This is  similar to the appearance on the prior chest CT. Patient has had a thyroid biopsy, performed on 03/27/2019. No additional follow-up recommended. No neck base or mediastinal masses. Abnormal left upper hilar soft tissue consistent with enlarged lymph node, 1.6 cm in short axis. No other hilar adenopathy. No mediastinal adenopathy. Trachea and esophagus are unremarkable. Lungs/Pleura: Left upper lobe pleural based mass, 4.7 x 2.9 cm transversely 4.6 cm superior to inferior. This is new since the prior CT. No other lung masses. No suspicious nodules. Patchy ground-glass type opacities noted in the left upper lobe mostly medial and posteriorly. Linear type right upper lobe opacities consistent with scarring stable from the prior CT. Emphysema with large right upper lobe bullae, unchanged. No pleural effusion or pneumothorax. Musculoskeletal: No fracture or acute finding. No osteoblastic or osteolytic lesions. Spine stabilization rods stable from the prior CT. No chest wall mass. CT ABDOMEN PELVIS FINDINGS Hepatobiliary: No focal liver abnormality is seen. No gallstones, gallbladder wall thickening, or biliary dilatation. Pancreas: Unremarkable. No pancreatic ductal dilatation or surrounding inflammatory changes. Spleen: Normal in size without focal abnormality. Adrenals/Urinary Tract: No adrenal masses. Kidneys normal in size, orientation and position with symmetric enhancement. No convincing renal mass, no stone and no hydronephrosis. Ureters not well visualized, but are grossly normal in course and in caliber. Bladder mostly decompressed, otherwise unremarkable. Stomach/Bowel: Bowel assessment limited due to lack of contrast and peritoneal fat. Allowing for this, stomach is unremarkable. Small bowel and colon are normal in caliber. No wall thickening or convincing inflammation. Vascular/Lymphatic: Aortic and iliac artery atherosclerotic calcifications. No aneurysm. No enlarged lymph nodes. Reproductive: Uterus and  bilateral adnexa are unremarkable. Other: No abdominal wall hernia or abnormality. No abdominopelvic ascites. Musculoskeletal: No fracture or acute finding. No osteoblastic or osteolytic lesions. Spine stabilization rods are stable from the prior CT. IMPRESSION: 1. Pleural based left upper lobe mass suspicious for bronchogenic carcinoma. This measures 4.7 x 2.9 x 4.6 cm. Tissue sampling recommended. 2. Patchy airspace opacities also noted in the left upper lobe, suspicious for multifocal pneumonia. 3. Enlarged superior left hilar lymph node suspected to be metastatic disease. 4. No other evidence of metastatic disease in the chest. No evidence of metastatic disease in the abdomen or pelvis. 5. No acute abnormality in the abdomen or pelvis. 6. Emphysema with large right upper lobe bulla stable from the prior CT. Electronically Signed   By: Amie Portland M.D.   On: 10/31/2022 13:06   CT HEAD W & WO CONTRAST ( )  Result Date: 10/31/2022 CLINICAL DATA:  Brain metastases suspected. EXAM: CT HEAD WITHOUT AND WITH CONTRAST TECHNIQUE: Contiguous axial images were obtained from the base of the skull through the vertex without and with intravenous contrast. RADIATION DOSE REDUCTION: This exam was performed according to the departmental dose-optimization program which includes automated exposure control, adjustment of the mA and/or kV according to patient size and/or use of iterative reconstruction technique. CONTRAST:  42mL OMNIPAQUE IOHEXOL 300 MG/ML  SOLN COMPARISON:  None Available. FINDINGS: Brain: No evidence of acute infarction, hemorrhage, hydrocephalus, extra-axial collection or mass lesion/mass effect. Left occipital DVA Vascular: No hyperdense vessel or unexpected calcification. Visible vessels are patent. Skull: Normal. Negative for fracture or focal lesion. Sinuses/Orbits: No acute finding. Other: None. IMPRESSION: No acute intracranial abnormality. No evidence of metastatic disease. Note that MRI is more  sensitive for detection of small intracranial metastases. If there is high clinical suspicious, further evaluation with a contrast enhanced MRI of the brain should be considered. Electronically Signed   By: Lorenza Cambridge M.D.   On: 10/31/2022 12:53   DG Chest 2 View  Result Date: 10/31/2022 CLINICAL DATA:  Cough and shortness of breath. EXAM: CHEST - 2 VIEW COMPARISON:  07/05/2021 FINDINGS: Heart size remains normal. Severe bullous emphysema is again seen with severe bullous disease in the right upper lobe. New masslike opacity is seen in the medial left upper lobe measuring approximately 4 cm, highly suspicious for bronchogenic carcinoma. No evidence of pleural effusion. Thoracic spine posterior fixation rods again noted. IMPRESSION: New masslike opacity in medial left upper lobe, highly suspicious for bronchogenic carcinoma. Chest CT with contrast is recommended for further evaluation. Severe bullous emphysema. Electronically Signed   By: Danae Orleans M.D.   On: 10/31/2022 09:50    ASSESSMENT: This is a very pleasant 47 years old African-American female recently diagnosed with at least stage IIb (T2b, N1, M0) non-small cell lung cancer with unknown subtype pending additional immunohistochemical stains of the lymph node biopsy diagnosed in January 2024 and presented with large left upper lobe lung mass in addition to left hilar adenopathy.   PLAN: I had a lengthy discussion with the patient today about her current condition, prognosis and treatment options. I personally and independently reviewed her imaging studies as well as the pathology report and discussed it with the patient. The patient had alpha 1 antitrypsin emphysema and very thin.  I am not sure if she would be a great surgical candidate for resection especially with the central left hilar adenopathy. I discussed with her her treatment options but I will not rule out with the surgical intervention.  I will refer her to cardiothoracic surgery  for evaluation regarding her option of surgical resection. If the patient is not a surgical candidate, we would consider her for a concurrent course of chemoradiation with weekly carboplatin for AUC of 2 and paclitaxel 45 Mg/M2 for 6-7 weeks. I will refer the patient to radiation oncology for evaluation and discussion of the radiotherapy option. I discussed with the patient the adverse effect of the chemotherapy including but not limited to alopecia, myelosuppression, nausea and vomiting, peripheral neuropathy, liver or renal dysfunction. If she is not a surgical candidate, she is expected to start the first dose of this treatment on December 05, 2022. I will see her back for follow-up visit at that time. Depending on the final histologic subtype, I may send her tissue biopsy of sufficient for molecular studies and PD-L1 expression. I will call her pharmacy with prescription for Compazine 10 mg p.o. every 6 hours as needed for nausea. I will also arrange for the patient to have a chemotherapy education class before the first dose of her treatment. The patient was advised to call immediately if she has any other concerning symptoms in the interval.  The patient voices understanding of current disease status and treatment options and is in agreement with the current care plan.  All questions were answered. The patient knows  to call the clinic with any problems, questions or concerns. We can certainly see the patient much sooner if necessary.  Thank you so much for allowing me to participate in the care of Amamda Tomasa Rand. I will continue to follow up the patient with you and assist in her care.  The total time spent in the appointment was 90 minutes.  Disclaimer: This note was dictated with voice recognition software. Similar sounding words can inadvertently be transcribed and may not be corrected upon review.   Lajuana Matte November 21, 2022, 2:22 PM

## 2022-11-21 NOTE — Research (Signed)
Trial:  [employer] 2021-05 - Specimen Collection Study to Evaluate Biomarkers in Subjects with Cancer  Patient Elaine Gibson was identified by Dr Arbutus Ped as a potential candidate for the above listed study.  This Clinical Research Nurse met with Anairis Knick, [id number], on 11/21/22 in a manner and location that ensures patient privacy to discuss participation in the above listed research study.  Patient is Unaccompanied.  A copy of the informed consent document with embedded HIPAA language was provided to the patient.  Patient reads, speaks, and understands [place].   Patient was provided with the business card of this Nurse and encouraged to contact the research team with any questions.  Approximately 15 minutes were spent with the patient reviewing the informed consent documents.  Patient was provided the option of taking informed consent documents home to review and was encouraged to review at their convenience with their support network, including other care providers. Patient took the consent documents home to review. Plan made for Research team to see her at her next appt/lab appt for consents and blood draw. Confirmed her phone number [phone] to ensure we touch base to schedule consents when that next appt is on the books.  Margret Chance Jodeen Mclin, RN, BSN, [facility] She  Her  Hers Clinical Research Nurse [facility] Direct Dial [phone]  Pager [phone] 11/21/2022 3:39 PM

## 2022-11-22 ENCOUNTER — Other Ambulatory Visit: Payer: Self-pay

## 2022-11-22 NOTE — Progress Notes (Signed)
Thoracic Location of Tumor / Histology: Left Upper Lobe Lung  Patient presented to the emergency department on October 31, 2022 with complaints of weight loss, nausea, and vomiting that was going on for several weeks.  She was diagnosed with Influenza A in early December.  During this hospitalization a chest x ray was done that showed a new mass like opacity in the medial left upper lobe highly suspicious for bronchogenic carcinoma.  Bronchoscopy 11/15/2022  PET 11/04/2022: Hypermetabolic 4.1 cm left upper lobe pulmonary nodule consistent with primary bronchogenic neoplasm.  There was There was hypermetabolic left hilar adenopathy consistent with nodal metastatic disease and no evidence of hypermetabolic distant metastatic disease.  There was a focus of FDG avidity in the ascending colon that is nonspecific.   CT Head 10/31/2022: Negative for malignancy.  CT CAP 10/31/2022: 4.7 x 2.9 x 4.6 cm left upper lobe pleural based mass.  Enlarged heterogeneous left thyroid lobe with a discrete 2.2 cm nodule as well as other smaller nodules.  thyroid lobe with a discrete 2.2 cm nodule as well as other smaller nodules. There was abnormal left upper lobe hilar soft tissue consistent with enlarged lymph node measuring 1.6 cm. There was no evidence of metastatic disease in the chest, abdomen and pelvis.   Chest X-ray 10/31/2022: New masslike opacity in medial left upper lobe, highly suspicious for bronchogenic carcinoma. Chest CT with contrast is recommended for further evaluation.   Biopsies of LUL Lung 11/15/2022     Tobacco/Marijuana/Snuff/ETOH use: Former smoker, quit 2020.  She smoked for about 30 years.  Past/Anticipated interventions by cardiothoracic surgery, if any:  Dr. Cliffton Asters 12/09/2022   Past/Anticipated interventions by medical oncology, if any:  Dr. Arbutus Ped 11/21/2022 -The patient had alpha 1 antitrypsin emphysema and very thin. I am not sure if she would be a great surgical candidate for  resection especially with the central left hilar adenopathy.  -I will refer her to cardiothoracic surgery for evaluation regarding her option of surgical resection.  -If the patient is not a surgical candidate, we would consider her for a concurrent course of chemoradiation. - I will refer the patient to radiation oncology for evaluation and discussion of the radiotherapy option.  -If she is not a surgical candidate, she is expected to start the first dose of this treatment on December 05, 2022.    Signs/Symptoms Weight changes, if any: Lost 25-30 pounds since this started.  She reports her appetite is much better and she has gained a pound. Respiratory complaints, if any: She reports SOB is better, notes continued with increased activity. Hemoptysis, if any: She reports productive cough with clear/yellow phlegm, getting over the flu.  Denies hemoptysis. Pain issues, if any:  Denies chest pain, pressure or tightness.  SAFETY ISSUES: Prior radiation? No Pacemaker/ICD? No  Possible current pregnancy? Having Cycles Is the patient on methotrexate? No  Current Complaints / other details:

## 2022-11-23 ENCOUNTER — Ambulatory Visit
Admission: RE | Admit: 2022-11-23 | Discharge: 2022-11-23 | Disposition: A | Discharge status: Home / Self Care | Payer: BC Managed Care – PPO | Source: Ambulatory Visit | Attending: Radiation Oncology | Admitting: Radiation Oncology

## 2022-11-23 ENCOUNTER — Other Ambulatory Visit: Payer: Self-pay

## 2022-11-23 ENCOUNTER — Encounter: Payer: Self-pay | Admitting: Radiation Oncology

## 2022-11-23 VITALS — BP 107/71 | HR 107 | Temp 97.3°F | Resp 18 | Ht 65.0 in | Wt 88.4 lb

## 2022-11-23 DIAGNOSIS — Z87891 Personal history of nicotine dependence: Secondary | ICD-10-CM | POA: Diagnosis not present

## 2022-11-23 DIAGNOSIS — R590 Localized enlarged lymph nodes: Secondary | ICD-10-CM | POA: Insufficient documentation

## 2022-11-23 DIAGNOSIS — E8801 Alpha-1-antitrypsin deficiency: Secondary | ICD-10-CM | POA: Diagnosis not present

## 2022-11-23 DIAGNOSIS — J432 Centrilobular emphysema: Secondary | ICD-10-CM | POA: Insufficient documentation

## 2022-11-23 DIAGNOSIS — E039 Hypothyroidism, unspecified: Secondary | ICD-10-CM | POA: Insufficient documentation

## 2022-11-23 DIAGNOSIS — C3412 Malignant neoplasm of upper lobe, left bronchus or lung: Secondary | ICD-10-CM

## 2022-11-23 DIAGNOSIS — R634 Abnormal weight loss: Secondary | ICD-10-CM | POA: Insufficient documentation

## 2022-11-23 DIAGNOSIS — I700 Atherosclerosis of aorta: Secondary | ICD-10-CM | POA: Diagnosis not present

## 2022-11-23 NOTE — Progress Notes (Signed)
Radiation Oncology         [phone]) [phone] ________________________________  Name: Elaine Gibson        MRN: [id number]  Date of Service: 11/23/2022 DOB: April 19, 1976  JC:SHRXUMZ, Dibas, MD  Si Gaul, MD     REFERRING PHYSICIAN: Si Gaul, MD   DIAGNOSIS: The encounter diagnosis was Malignant neoplasm of upper lobe of left lung (HCC).   HISTORY OF PRESENT ILLNESS: Elaine Gibson is a 47 y.o. female with a past medical history significant for alpha 1 antitrypsin deficiency carrier, COPD, hypothyroidism as well as scoliosis seen at the request of Dr. Arbutus Ped. She originally presented to the ED on 10/31/22 complaining of a several week history of 25-30 lb weight loss, nausea, and vomiting that started when she was diagnosed with influenza A in early December 2023. CXR on 10/31/22 revealed a new mass like opacity in the medial left upper lobe, highly suspicious for bronchogenic carcinoma. CT of the chest, abdomen, and pelvis on 10/31/22 confirmed a left upper lobe mass measuring 4.7 x 2.9 x 4.6 cm and an enlarged superior left hilar lymph node suspected to be metastatic disease. Of note, patchy airspace opacities were also noted in the left upper lobe, suspicious for multifocal pneumonia. An enlarged heterogeneous left thyroid lobe with a discrete 2.2 cm nodule along with other smaller nodules were also visualized. Patient had a benign thyroid biopsy performed on 03/27/2019, so no additional follow-up was recommended. CT of the head on 10/31/22 showed no evidence of metastatic disease. PET on 11/04/22 showed a hypermetabolic 4.1 cm left upper lobe pulmonary nodule and a hypermetabolic left hilar adenopathy, consistent with findings from previous imaging. There was no evidence of distant metastatic disease. Of note, there was a focus of FDG avidity in the ascending colon, further evaluation with colonoscopy was recommended.   Dr. Tonia Brooms performed a bronchoscopy and biopsy on 11/15/22  which revealed malignant cells consistent with non-small cell carcinoma with no clear subtype. The results of the fine-needle aspiration of the left hilar lymph node are pending. She was seen by Dr. Arbutus Ped on 11/21/22. At the time, he did not think she would be a great candidate for resection due to her central left hilar adenopathy but referred her to cardiothoracic surgery for further evaluation. If she is not a candidate for resection Dr. Arbutus Ped recommended 6-7 weeks of concurrent chemoradiation. She is scheduled for her first dose of chemotherapy on 12/05/22. She was kindly referred to [place] today to discuss radiation treatment options.   Patient states her breathing is okay today. She experiences shortness of breath at baseline. She endorses a productive cough since she had the flu in early December of 2023. Patient has 30 lbs of weight loss since early December due to a decrease in appetite. She also endorses extreme fatigue. She states she is only able to lay around when she is not at work. She denies any hemoptysis, chest pain, fever, or chills.    PREVIOUS RADIATION THERAPY: No   PAST MEDICAL HISTORY:  Past Medical History:  Diagnosis Date   Alpha-1-antitrypsin deficiency carrier    Dyspnea    Emphysema lung (HCC)    Emphysema of lung (HCC)    Hypothyroidism    tumors    Scoliosis        PAST SURGICAL HISTORY: Past Surgical History:  Procedure Laterality Date   BACK SURGERY     BRONCHIAL BIOPSY  11/15/2022   Procedure: BRONCHIAL BIOPSIES;  Surgeon: Josephine Igo, DO;  Location:  [facility];  Service: Pulmonary;;   BRONCHIAL NEEDLE ASPIRATION BIOPSY  11/15/2022   Procedure: BRONCHIAL NEEDLE ASPIRATION BIOPSIES;  Surgeon: Garner Nash, DO;  Location: [facility];  Service: Pulmonary;;   CESAREAN SECTION     FINE NEEDLE ASPIRATION  11/15/2022   Procedure: FINE NEEDLE ASPIRATION (FNA) LINEAR;  Surgeon: Garner Nash, DO;  Location: [facility];  Service: Pulmonary;;   VIDEO  BRONCHOSCOPY WITH ENDOBRONCHIAL ULTRASOUND Bilateral 11/15/2022   Procedure: VIDEO BRONCHOSCOPY WITH ENDOBRONCHIAL ULTRASOUND;  Surgeon: Garner Nash, DO;  Location: [facility];  Service: Pulmonary;  Laterality: Bilateral;     FAMILY HISTORY:  Family History  Problem Relation Age of Onset   Hypertension Mother    Kidney disease Mother    Stomach cancer Father    Gestational diabetes Sister      SOCIAL HISTORY:  reports that she quit smoking about 3 years ago. Her smoking use included cigars. She started smoking about 37 years ago. She has never used smokeless tobacco. She reports that she does not drink alcohol and does not use drugs.  She currently lives alone in [place]. Her two sons live close by. She works third shift at [employer] in [place] but is planning on taking a leave of absence during her treatment.   ALLERGIES: Patient has no known allergies.   MEDICATIONS:  Current Outpatient Medications  Medication Sig Dispense Refill   albuterol (VENTOLIN HFA) 108 (90 Base) MCG/ACT inhaler Inhale 2 puffs into the lungs daily. 1 each 0   Fluticasone-Umeclidin-Vilant (TRELEGY ELLIPTA) 100-62.5-25 MCG/ACT AEPB Inhale 1 puff into the lungs daily. 60 each 0   No current facility-administered medications for this encounter.     REVIEW OF SYSTEMS: As per HPI above.     PHYSICAL EXAM:  Wt Readings from Last 3 Encounters:  11/23/22 88 lb 6.4 oz (40.1 kg)  11/21/22 85 lb 5 oz (38.7 kg)  11/15/22 80 lb (36.3 kg)   Temp Readings from Last 3 Encounters:  11/23/22 (!) 97.3 F (36.3 C)  11/21/22 98.9 F (37.2 C) (Oral)  11/15/22 98.2 F (36.8 C)   BP Readings from Last 3 Encounters:  11/23/22 107/71  11/21/22 104/63  11/15/22 (!) 95/59   Pulse Readings from Last 3 Encounters:  11/23/22 (!) 107  11/21/22 (!) 108  11/15/22 96   Pain Assessment Pain Score: 0-No pain/10  In general this is a well appearing female in no acute distress. She's alert and oriented x4  and appropriate throughout the examination. Cardiopulmonary assessment is negative for acute distress and she exhibits normal effort.   ECOG = 1  0 - Asymptomatic (Fully active, able to carry on all predisease activities without restriction)  1 - Symptomatic but completely ambulatory (Restricted in physically strenuous activity but ambulatory and able to carry out work of a light or sedentary nature. For example, light housework, office work)  2 - Symptomatic, <50% in bed during the day (Ambulatory and capable of all self care but unable to carry out any work activities. Up and about more than 50% of waking hours)  3 - Symptomatic, >50% in bed, but not bedbound (Capable of only limited self-care, confined to bed or chair 50% or more of waking hours)  4 - Bedbound (Completely disabled. Cannot carry on any self-care. Totally confined to bed or chair)  5 - Death   Eustace Pen MM, Creech RH, Tormey DC, et al. [phone]). "Toxicity and response criteria of the [facility]". [place] Oncol. 5 (  6): 649-55    LABORATORY DATA:  Lab Results  Component Value Date   WBC 11.1 (H) 11/21/2022   HGB 7.5 (L) 11/21/2022   HCT 23.9 (L) 11/21/2022   MCV 77.3 (L) 11/21/2022   PLT 445 (H) 11/21/2022   Lab Results  Component Value Date   NA 135 11/21/2022   K 3.7 11/21/2022   CL 103 11/21/2022   CO2 27 11/21/2022   Lab Results  Component Value Date   ALT 10 11/21/2022   AST 13 (L) 11/21/2022   ALKPHOS 133 (H) 11/21/2022   BILITOT 0.3 11/21/2022      RADIOGRAPHY: DG Chest Port 1 View  Result Date: 11/15/2022 CLINICAL DATA:  Status post bronchoscopy. EXAM: PORTABLE CHEST 1 VIEW COMPARISON:  X-ray 10/31/2022. CT and PET-CT from December 2023 as well. FINDINGS: Hyperinflation with large bullous/bleb formation at the right upper lung. Stable left upper lobe rounded opacity. Please correlate with prior workup. Apical pleural thickening. No pneumothorax or effusion. Stable  cardiopericardial silhouette. No edema. Extensive spine hardware from Harrington rods. Overlapping cardiac leads. IMPRESSION: No pneumothorax post bronchoscopy. Electronically Signed   By: Karen Kays M.D.   On: 11/15/2022 10:53   DG C-ARM BRONCHOSCOPY  Result Date: 11/15/2022 C-ARM BRONCHOSCOPY: Fluoroscopy was utilized by the requesting physician.  No radiographic interpretation.   NM PET Image Initial (PI) Skull Base To Thigh (F-18 FDG)  Result Date: 11/07/2022 CLINICAL DATA:  Initial treatment strategy for pulmonary nodule. EXAM: NUCLEAR MEDICINE PET SKULL BASE TO THIGH TECHNIQUE: 4.95 mCi F-18 FDG was injected intravenously. Full-ring PET imaging was performed from the skull base to thigh after the radiotracer. CT data was obtained and used for attenuation correction and anatomic localization. Fasting blood glucose: 85 mg/dl COMPARISON:  CT chest abdomen pelvis October 31, 2022 FINDINGS: Mediastinal blood pool activity: SUV max 1.6 Liver activity: SUV max NA NECK: No hypermetabolic cervical adenopathy. Low-level hypermetabolic activity in a 2.2 cm left thyroid nodule with a max SUV of 2.5 previously evaluated with thyroid ultrasound. Incidental CT findings: None. CHEST: Hypermetabolic pulmonary nodule in the left lung apex measures 4.1 cm on image 11/9 with a max SUV of 36.7. Hypermetabolic left hilar adenopathy with a lymph node measuring proximally 13 mm in short axis on image 72/4 and a max SUV of 19.5. No hypermetabolic mediastinal, supraclavicular or right hilar adenopathy identified. Incidental CT findings: Bullous emphysematous change in the right lung. Similar reticular and ground-glass opacities in the lingula which do not demonstrate significant abnormal FDG avidity. ABDOMEN/PELVIS: Focus of nodular hypermetabolic activity in the ascending colon with a max SUV of 4.7. No abnormal hypermetabolic activity within the liver, pancreas, adrenal glands, or spleen. No hypermetabolic lymph nodes in the  abdomen or pelvis. Incidental CT findings: Aortic atherosclerosis. Tubal ligation clips. SKELETON: No focal hypermetabolic activity to suggest skeletal metastasis. Incidental CT findings: Posterior spinal stabilization rods. IMPRESSION: 1. Hypermetabolic 4.1 cm left upper lobe pulmonary nodule, consistent with primary bronchogenic neoplasm. 2. Hypermetabolic left hilar adenopathy, consistent with nodal metastatic disease. 3. No evidence of hypermetabolic distant metastatic disease. 4. Focus of FDG avidity in the ascending colon is nonspecific, consider further evaluation with colonoscopy. 5.  Aortic Atherosclerosis (ICD10-I70.0). Electronically Signed   By: Maudry Mayhew M.D.   On: 11/07/2022 13:20   CT CHEST ABDOMEN PELVIS W CONTRAST  Result Date: 10/31/2022 CLINICAL DATA:  Lung mass. Assess for metastatic disease. Patient with nausea, diarrhea, headache and decreased oral in trach for several months. Intermittent cough. EXAM: CT CHEST, ABDOMEN,  AND PELVIS WITH CONTRAST TECHNIQUE: Multidetector CT imaging of the chest, abdomen and pelvis was performed following the standard protocol during bolus administration of intravenous contrast. RADIATION DOSE REDUCTION: This exam was performed according to the departmental dose-optimization program which includes automated exposure control, adjustment of the mA and/or kV according to patient size and/or use of iterative reconstruction technique. CONTRAST:  55mL OMNIPAQUE IOHEXOL 300 MG/ML  SOLN COMPARISON:  Chest CT, 01/21/2020.  Current chest radiograph. FINDINGS: CT CHEST FINDINGS Cardiovascular: Heart normal in size. No coronary artery calcifications. No pericardial effusion. Normal great vessels. Mediastinum/Nodes: Enlarged heterogeneous left thyroid lobe with a discrete 2.2 cm nodule, other smaller nodules. This is similar to the appearance on the prior chest CT. Patient has had a thyroid biopsy, performed on 03/27/2019. No additional follow-up recommended. No  neck base or mediastinal masses. Abnormal left upper hilar soft tissue consistent with enlarged lymph node, 1.6 cm in short axis. No other hilar adenopathy. No mediastinal adenopathy. Trachea and esophagus are unremarkable. Lungs/Pleura: Left upper lobe pleural based mass, 4.7 x 2.9 cm transversely 4.6 cm superior to inferior. This is new since the prior CT. No other lung masses. No suspicious nodules. Patchy ground-glass type opacities noted in the left upper lobe mostly medial and posteriorly. Linear type right upper lobe opacities consistent with scarring stable from the prior CT. Emphysema with large right upper lobe bullae, unchanged. No pleural effusion or pneumothorax. Musculoskeletal: No fracture or acute finding. No osteoblastic or osteolytic lesions. Spine stabilization rods stable from the prior CT. No chest wall mass. CT ABDOMEN PELVIS FINDINGS Hepatobiliary: No focal liver abnormality is seen. No gallstones, gallbladder wall thickening, or biliary dilatation. Pancreas: Unremarkable. No pancreatic ductal dilatation or surrounding inflammatory changes. Spleen: Normal in size without focal abnormality. Adrenals/Urinary Tract: No adrenal masses. Kidneys normal in size, orientation and position with symmetric enhancement. No convincing renal mass, no stone and no hydronephrosis. Ureters not well visualized, but are grossly normal in course and in caliber. Bladder mostly decompressed, otherwise unremarkable. Stomach/Bowel: Bowel assessment limited due to lack of contrast and peritoneal fat. Allowing for this, stomach is unremarkable. Small bowel and colon are normal in caliber. No wall thickening or convincing inflammation. Vascular/Lymphatic: Aortic and iliac artery atherosclerotic calcifications. No aneurysm. No enlarged lymph nodes. Reproductive: Uterus and bilateral adnexa are unremarkable. Other: No abdominal wall hernia or abnormality. No abdominopelvic ascites. Musculoskeletal: No fracture or acute  finding. No osteoblastic or osteolytic lesions. Spine stabilization rods are stable from the prior CT. IMPRESSION: 1. Pleural based left upper lobe mass suspicious for bronchogenic carcinoma. This measures 4.7 x 2.9 x 4.6 cm. Tissue sampling recommended. 2. Patchy airspace opacities also noted in the left upper lobe, suspicious for multifocal pneumonia. 3. Enlarged superior left hilar lymph node suspected to be metastatic disease. 4. No other evidence of metastatic disease in the chest. No evidence of metastatic disease in the abdomen or pelvis. 5. No acute abnormality in the abdomen or pelvis. 6. Emphysema with large right upper lobe bulla stable from the prior CT. Electronically Signed   By: Amie Portland M.D.   On: 10/31/2022 13:06   CT HEAD W & WO CONTRAST ( )  Result Date: 10/31/2022 CLINICAL DATA:  Brain metastases suspected. EXAM: CT HEAD WITHOUT AND WITH CONTRAST TECHNIQUE: Contiguous axial images were obtained from the base of the skull through the vertex without and with intravenous contrast. RADIATION DOSE REDUCTION: This exam was performed according to the departmental dose-optimization program which includes automated exposure control, adjustment of the  mA and/or kV according to patient size and/or use of iterative reconstruction technique. CONTRAST:  76mL OMNIPAQUE IOHEXOL 300 MG/ML  SOLN COMPARISON:  None Available. FINDINGS: Brain: No evidence of acute infarction, hemorrhage, hydrocephalus, extra-axial collection or mass lesion/mass effect. Left occipital DVA Vascular: No hyperdense vessel or unexpected calcification. Visible vessels are patent. Skull: Normal. Negative for fracture or focal lesion. Sinuses/Orbits: No acute finding. Other: None. IMPRESSION: No acute intracranial abnormality. No evidence of metastatic disease. Note that MRI is more sensitive for detection of small intracranial metastases. If there is high clinical suspicious, further evaluation with a contrast enhanced MRI of  the brain should be considered. Electronically Signed   By: Lorenza Cambridge M.D.   On: 10/31/2022 12:53   DG Chest 2 View  Result Date: 10/31/2022 CLINICAL DATA:  Cough and shortness of breath. EXAM: CHEST - 2 VIEW COMPARISON:  07/05/2021 FINDINGS: Heart size remains normal. Severe bullous emphysema is again seen with severe bullous disease in the right upper lobe. New masslike opacity is seen in the medial left upper lobe measuring approximately 4 cm, highly suspicious for bronchogenic carcinoma. No evidence of pleural effusion. Thoracic spine posterior fixation rods again noted. IMPRESSION: New masslike opacity in medial left upper lobe, highly suspicious for bronchogenic carcinoma. Chest CT with contrast is recommended for further evaluation. Severe bullous emphysema. Electronically Signed   By: Danae Orleans M.D.   On: 10/31/2022 09:50       IMPRESSION/PLAN: Stage IIb (T2b, N1, M0) non-small cell lung cancer with unknown subtype pending additional immunohistochemical stains of the lymph node; presented with large left upper lobe lung mass in addition to left hilar adenopathy   Today, we discussed the pathology results and the nature of Stage IIb lung cancer. Dr. Mitzi Hansen is in agreement with plans for concurrent chemoradiation. First cycle of chemotherapy is scheduled for 12/05/22. She was scheduled for a surgery consult on 12/09/22 with Dr. Cliffton Asters, but decided to cancel that appointment and proceed with chemoradiation. Patient knows she is not a great surgical candidate and did not want to prolong her treatment. We called Dr. Lucilla Lame nurse to notify them of the cancellation. We discussed the risks, benefits, short, and long term effects of radiotherapy, as well as the curative intent, and the patient is interested in proceeding. Dr. Mitzi Hansen discussed the delivery and logistics of radiotherapy and anticipates a course of 6 1/2 weeks of radiotherapy to the left upper lobe lung mass as well as the hilar  adenopathy.  Patient has a good understanding of the treatment plan and is enthusiastic about beginning treatment. All questions were answered. We will schedule CT simulation for next week and plan to begin radiation treatment at the start of her first chemotherapy cycle on 12/05/22.  Patient has experienced significant weight loss in the past month due to poor appetite. Referral to nutrition was made today.   **Disclaimer: This note was dictated with voice recognition software. Similar sounding words can inadvertently be transcribed and this note may contain transcription errors which may not have been corrected upon publication of note.**

## 2022-11-24 ENCOUNTER — Other Ambulatory Visit: Payer: Self-pay

## 2022-11-24 ENCOUNTER — Other Ambulatory Visit: Payer: Self-pay | Admitting: Physician Assistant

## 2022-11-24 ENCOUNTER — Telehealth: Payer: Self-pay | Admitting: Internal Medicine

## 2022-11-24 DIAGNOSIS — C3492 Malignant neoplasm of unspecified part of left bronchus or lung: Secondary | ICD-10-CM

## 2022-11-24 LAB — CYTOLOGY - NON PAP

## 2022-11-24 MED ORDER — PROCHLORPERAZINE MALEATE 10 MG PO TABS: 10.0000 mg | ORAL_TABLET | Freq: Four times a day (QID) | ORAL | 2 refills | Status: DC | PRN

## 2022-11-24 NOTE — Progress Notes (Signed)
The proposed treatment discussed in conference is for discussion purpose only and is not a binding recommendation.  The patients have not been physically examined, or presented with their treatment options.  Therefore, final treatment plans cannot be decided.  

## 2022-11-24 NOTE — Progress Notes (Signed)
disregard

## 2022-11-24 NOTE — Telephone Encounter (Signed)
Scheduled per 01/15 los and work-queue called patient and left a voicemail regarding ALL upcoming appointments.

## 2022-11-27 ENCOUNTER — Other Ambulatory Visit: Payer: Self-pay

## 2022-11-28 ENCOUNTER — Inpatient Hospital Stay: Payer: BC Managed Care – PPO

## 2022-11-28 ENCOUNTER — Ambulatory Visit
Admission: RE | Admit: 2022-11-28 | Discharge: 2022-11-28 | Disposition: A | Discharge status: Home / Self Care | Payer: BC Managed Care – PPO | Source: Ambulatory Visit | Attending: Radiation Oncology | Admitting: Radiation Oncology

## 2022-11-28 ENCOUNTER — Other Ambulatory Visit: Payer: Self-pay

## 2022-11-28 DIAGNOSIS — Z510 Encounter for antineoplastic radiation therapy: Secondary | ICD-10-CM | POA: Insufficient documentation

## 2022-11-28 DIAGNOSIS — Z5111 Encounter for antineoplastic chemotherapy: Secondary | ICD-10-CM | POA: Diagnosis not present

## 2022-11-28 DIAGNOSIS — C3492 Malignant neoplasm of unspecified part of left bronchus or lung: Secondary | ICD-10-CM

## 2022-11-28 DIAGNOSIS — C3412 Malignant neoplasm of upper lobe, left bronchus or lung: Secondary | ICD-10-CM | POA: Insufficient documentation

## 2022-11-28 NOTE — Research (Signed)
[employer] 2021-05 - Specimen Collection Study to Evaluate Biomarkers in Subjects with Cancer   Met with patient prior to her chemo ed class. She is still interested in research enrollment. Plan made to meet prior to her first chemo on 12/05/21 for consent with lab appointment already scheduled. She has no further questions for research at this time.  Ms Aure requested help getting her FMLA form to the right person. No nurse is at the desk, so inbasket message was sent to both Roz and to Dr Asa Lente RN Diane.  Margret Chance Clive Parcel, RN, BSN, [facility] She  Her  Hers Clinical Research Nurse [facility] Direct Dial [phone]  Pager [phone] 11/28/2022 4:07 PM

## 2022-11-28 NOTE — Progress Notes (Signed)
Pharmacist Chemotherapy Monitoring - Initial Assessment    Anticipated start date: 12/05/22   The following has been reviewed per standard work regarding the patient's treatment regimen: The patient's diagnosis, treatment plan and drug doses, and organ/hematologic function Lab orders and baseline tests specific to treatment regimen  The treatment plan start date, drug sequencing, and pre-medications Prior authorization status  Patient's documented medication list, including drug-drug interaction screen and prescriptions for anti-emetics and supportive care specific to the treatment regimen The drug concentrations, fluid compatibility, administration routes, and timing of the medications to be used The patient's access for treatment and lifetime cumulative dose history, if applicable  The patient's medication allergies and previous infusion related reactions, if applicable   Changes made to treatment plan:  N/A  Follow up needed:  Pending authorization for treatment    Janeice Robinson, RPH, 11/28/2022  11:51 AM

## 2022-12-01 ENCOUNTER — Encounter: Payer: Self-pay | Admitting: Medical Oncology

## 2022-12-01 DIAGNOSIS — Z5111 Encounter for antineoplastic chemotherapy: Secondary | ICD-10-CM | POA: Diagnosis not present

## 2022-12-02 MED FILL — Dexamethasone Sodium Phosphate Inj 100 MG/10ML: INTRAMUSCULAR | Qty: 1 | Status: AC

## 2022-12-05 ENCOUNTER — Inpatient Hospital Stay (HOSPITAL_BASED_OUTPATIENT_CLINIC_OR_DEPARTMENT_OTHER): Payer: BC Managed Care – PPO | Admitting: Internal Medicine

## 2022-12-05 ENCOUNTER — Other Ambulatory Visit: Payer: Self-pay

## 2022-12-05 ENCOUNTER — Ambulatory Visit
Admission: RE | Admit: 2022-12-05 | Discharge: 2022-12-05 | Disposition: A | Discharge status: Home / Self Care | Payer: BC Managed Care – PPO | Source: Ambulatory Visit | Attending: Radiation Oncology | Admitting: Radiation Oncology

## 2022-12-05 ENCOUNTER — Inpatient Hospital Stay: Payer: BC Managed Care – PPO

## 2022-12-05 VITALS — BP 113/77 | HR 129 | Temp 97.9°F | Resp 15 | Ht 65.0 in | Wt 83.8 lb

## 2022-12-05 VITALS — BP 95/62 | HR 102 | Temp 98.7°F | Resp 21

## 2022-12-05 DIAGNOSIS — C3492 Malignant neoplasm of unspecified part of left bronchus or lung: Secondary | ICD-10-CM | POA: Diagnosis not present

## 2022-12-05 DIAGNOSIS — R000 Tachycardia, unspecified: Secondary | ICD-10-CM | POA: Diagnosis not present

## 2022-12-05 DIAGNOSIS — Z5111 Encounter for antineoplastic chemotherapy: Secondary | ICD-10-CM | POA: Diagnosis not present

## 2022-12-05 LAB — RAD ONC ARIA SESSION SUMMARY
Course Elapsed Days: 0
Plan Fractions Treated to Date: 1
Plan Prescribed Dose Per Fraction: 2 Gy
Plan Total Fractions Prescribed: 30
Plan Total Prescribed Dose: 60 Gy
Reference Point Dosage Given to Date: 2 Gy
Reference Point Session Dosage Given: 2 Gy
Session Number: 1

## 2022-12-05 LAB — CBC WITH DIFFERENTIAL (CANCER CENTER ONLY)
Abs Immature Granulocytes: 0.06 10*3/uL (ref 0.00–0.07)
Basophils Absolute: 0.1 10*3/uL (ref 0.0–0.1)
Basophils Relative: 0 %
Eosinophils Absolute: 0 10*3/uL (ref 0.0–0.5)
Eosinophils Relative: 0 %
HCT: 27 % — ABNORMAL LOW (ref 36.0–46.0)
Hemoglobin: 8.2 g/dL — ABNORMAL LOW (ref 12.0–15.0)
Immature Granulocytes: 1 %
Lymphocytes Relative: 12 %
Lymphs Abs: 1.3 10*3/uL (ref 0.7–4.0)
MCH: 23.2 pg — ABNORMAL LOW (ref 26.0–34.0)
MCHC: 30.4 g/dL (ref 30.0–36.0)
MCV: 76.5 fL — ABNORMAL LOW (ref 80.0–100.0)
Monocytes Absolute: 0.8 10*3/uL (ref 0.1–1.0)
Monocytes Relative: 8 %
Neutro Abs: 8.9 10*3/uL — ABNORMAL HIGH (ref 1.7–7.7)
Neutrophils Relative %: 79 %
Platelet Count: 735 10*3/uL — ABNORMAL HIGH (ref 150–400)
RBC: 3.53 MIL/uL — ABNORMAL LOW (ref 3.87–5.11)
RDW: 15.7 % — ABNORMAL HIGH (ref 11.5–15.5)
WBC Count: 11.2 10*3/uL — ABNORMAL HIGH (ref 4.0–10.5)
nRBC: 0 % (ref 0.0–0.2)

## 2022-12-05 LAB — CMP (CANCER CENTER ONLY)
ALT: 12 U/L (ref 0–44)
AST: 18 U/L (ref 15–41)
Albumin: 3 g/dL — ABNORMAL LOW (ref 3.5–5.0)
Alkaline Phosphatase: 198 U/L — ABNORMAL HIGH (ref 38–126)
Anion gap: 10 (ref 5–15)
BUN: 14 mg/dL (ref 6–20)
CO2: 24 mmol/L (ref 22–32)
Calcium: 9 mg/dL (ref 8.9–10.3)
Chloride: 99 mmol/L (ref 98–111)
Creatinine: 0.81 mg/dL (ref 0.44–1.00)
GFR, Estimated: >60 mL/min (ref >60)
Glucose, Bld: 138 mg/dL — ABNORMAL HIGH (ref 70–99)
Potassium: 4.1 mmol/L (ref 3.5–5.1)
Sodium: 133 mmol/L — ABNORMAL LOW (ref 135–145)
Total Bilirubin: 0.2 mg/dL — ABNORMAL LOW (ref 0.3–1.2)
Total Protein: 7.5 g/dL (ref 6.5–8.1)

## 2022-12-05 LAB — RESEARCH LABS

## 2022-12-05 MED ORDER — SODIUM CHLORIDE 0.9 % IV SOLN
45.0000 mg/m2 | Freq: Once | INTRAVENOUS | Status: AC
  Administered 2022-12-05: 60 mg via INTRAVENOUS
  Filled 2022-12-05: qty 10

## 2022-12-05 MED ORDER — INTEGRA PLUS PO CAPS: 1.0000 | ORAL_CAPSULE | Freq: Every day | ORAL | 3 refills | Status: DC

## 2022-12-05 MED ORDER — SODIUM CHLORIDE 0.9 % IV SOLN
157.4000 mg | Freq: Once | INTRAVENOUS | Status: AC
  Administered 2022-12-05: 150 mg via INTRAVENOUS
  Filled 2022-12-05: qty 15

## 2022-12-05 MED ORDER — PALONOSETRON HCL INJECTION 0.25 MG/5ML
0.2500 mg | Freq: Once | INTRAVENOUS | Status: AC
  Administered 2022-12-05: 0.25 mg via INTRAVENOUS
  Filled 2022-12-05: qty 5

## 2022-12-05 MED ORDER — SODIUM CHLORIDE 0.9 % IV SOLN: Freq: Once | INTRAVENOUS | Status: AC

## 2022-12-05 MED ORDER — SODIUM CHLORIDE 0.9 % IV SOLN: INTRAVENOUS | Status: DC

## 2022-12-05 MED ORDER — FAMOTIDINE IN NACL 20-0.9 MG/50ML-% IV SOLN
20.0000 mg | Freq: Once | INTRAVENOUS | Status: AC
  Administered 2022-12-05: 20 mg via INTRAVENOUS
  Filled 2022-12-05: qty 50

## 2022-12-05 MED ORDER — DIPHENHYDRAMINE HCL 50 MG/ML IJ SOLN
50.0000 mg | Freq: Once | INTRAMUSCULAR | Status: AC
  Administered 2022-12-05: 50 mg via INTRAVENOUS
  Filled 2022-12-05: qty 1

## 2022-12-05 MED ORDER — OMEPRAZOLE 20 MG PO CPDR: 20.0000 mg | DELAYED_RELEASE_CAPSULE | Freq: Every day | ORAL | 1 refills | Status: DC

## 2022-12-05 MED ORDER — SODIUM CHLORIDE 0.9 % IV SOLN
10.0000 mg | Freq: Once | INTRAVENOUS | Status: AC
  Administered 2022-12-05: 10 mg via INTRAVENOUS
  Filled 2022-12-05: qty 10

## 2022-12-05 NOTE — Progress Notes (Signed)
Per Dr Julien Nordmann , it is ok to teat pt today with Carboplatin and taxol and pulse of 129.

## 2022-12-05 NOTE — Progress Notes (Signed)
[facility] [phone]   [phone]  PROGRESS NOTE FOR TELEMEDICINE VISITS  Lujean Amel, MD [address] [place] [address]  I connected withNAME@ on 12/05/22 at  9:15 AM EST by video enabled telemedicine visit and verified that I am speaking with the correct person using two identifiers.   I discussed the limitations, risks, security and privacy concerns of performing an evaluation and management service by telemedicine and the availability of in-person appointments. I also discussed with the patient that there may be a patient responsible charge related to this service. The patient expressed understanding and agreed to proceed.  Other persons participating in the visit and their role in the encounter:  None  Patient's location: [facility] exam room Provider's location: [place] office  DIAGNOSIS: Stage IIb (T2b, N1, M0) non-small cell lung cancer with unknown subtype pending additional immunohistochemical stains of the lymph node biopsy diagnosed in January 2024 and presented with large left upper lobe lung mass in addition to left hilar adenopathy.   Molecular studies by [employer] tissues are still pending but the blood showed no actionable mutations.  PRIOR THERAPY: None  CURRENT THERAPY: Concurrent chemoradiation with weekly carboplatin for AUC of 2 and paclitaxel 45 Mg/M2.  First dose December 05, 2022.  INTERVAL HISTORY: Kalani Baray 47 y.o. female returns to the clinic today for follow-up.  The patient is feeling fine today with no concerning complaints except for the fatigue.  She has persistent iron deficiency anemia and she is not on any iron supplements.  She also has some epigastric pain and questionable acid reflux.  The patient denied having any significant chest pain, cough or hemoptysis.  She has no nausea, vomiting, diarrhea or constipation.  She has no headache or  visual changes.  She is here today for evaluation before starting the first dose of her course of concurrent chemoradiation.  MEDICAL HISTORY: Past Medical History:  Diagnosis Date   Alpha-1-antitrypsin deficiency carrier    Dyspnea    Emphysema lung ([facility])    Emphysema of lung ([facility])    Hypothyroidism    tumors    Scoliosis     ALLERGIES:  has No Known Allergies.  MEDICATIONS:  Current Outpatient Medications  Medication Sig Dispense Refill   albuterol (VENTOLIN HFA) 108 (90 Base) MCG/ACT inhaler Inhale 2 puffs into the lungs daily. 1 each 0   Fluticasone-Umeclidin-Vilant (TRELEGY ELLIPTA) 100-62.5-25 MCG/ACT AEPB Inhale 1 puff into the lungs daily. 60 each 0   prochlorperazine (COMPAZINE) 10 MG tablet Take 1 tablet (10 mg total) by mouth every 6 (six) hours as needed. 30 tablet 2   No current facility-administered medications for this visit.    SURGICAL HISTORY:  Past Surgical History:  Procedure Laterality Date   BACK SURGERY     BRONCHIAL BIOPSY  11/15/2022   Procedure: BRONCHIAL BIOPSIES;  Surgeon: Garner Nash, DO;  Location: [facility];  Service: Pulmonary;;   BRONCHIAL NEEDLE ASPIRATION BIOPSY  11/15/2022   Procedure: BRONCHIAL NEEDLE ASPIRATION BIOPSIES;  Surgeon: Garner Nash, DO;  Location: [facility];  Service: Pulmonary;;   CESAREAN SECTION     FINE NEEDLE ASPIRATION  11/15/2022   Procedure: FINE NEEDLE ASPIRATION (FNA) LINEAR;  Surgeon: Garner Nash, DO;  Location: [facility];  Service: Pulmonary;;   VIDEO BRONCHOSCOPY WITH ENDOBRONCHIAL ULTRASOUND Bilateral 11/15/2022   Procedure: VIDEO BRONCHOSCOPY WITH ENDOBRONCHIAL ULTRASOUND;  Surgeon: Garner Nash, DO;  Location: [facility];  Service:  Pulmonary;  Laterality: Bilateral;    REVIEW OF SYSTEMS:  A comprehensive review of systems was negative except for: Constitutional: positive for fatigue Gastrointestinal: positive for dyspepsia   ECOG PERFORMANCE STATUS: 1 - Symptomatic but completely  ambulatory  Blood pressure 113/77, pulse (!) 129, temperature 97.9 F (36.6 C), temperature source Temporal, resp. rate 15, height 5\' 5"  (1.651 m), weight 83 lb 12.8 oz (38 kg), SpO2 99 %.  LABORATORY DATA: Lab Results  Component Value Date   WBC 11.2 (H) 12/05/2022   HGB 8.2 (L) 12/05/2022   HCT 27.0 (L) 12/05/2022   MCV 76.5 (L) 12/05/2022   PLT 735 (H) 12/05/2022      Chemistry      Component Value Date/Time   NA 135 11/21/2022 1339   K 3.7 11/21/2022 1339   CL 103 11/21/2022 1339   CO2 27 11/21/2022 1339   BUN 12 11/21/2022 1339   CREATININE 0.61 11/21/2022 1339      Component Value Date/Time   CALCIUM 8.8 (L) 11/21/2022 1339   ALKPHOS 133 (H) 11/21/2022 1339   AST 13 (L) 11/21/2022 1339   ALT 10 11/21/2022 1339   BILITOT 0.3 11/21/2022 1339       RADIOGRAPHIC STUDIES: DG Chest Port 1 View  Result Date: 11/15/2022 CLINICAL DATA:  Status post bronchoscopy. EXAM: PORTABLE CHEST 1 VIEW COMPARISON:  X-ray 10/31/2022. CT and PET-CT from December 2023 as well. FINDINGS: Hyperinflation with large bullous/bleb formation at the right upper lung. Stable left upper lobe rounded opacity. Please correlate with prior workup. Apical pleural thickening. No pneumothorax or effusion. Stable cardiopericardial silhouette. No edema. Extensive spine hardware from Harrington rods. Overlapping cardiac leads. IMPRESSION: No pneumothorax post bronchoscopy. Electronically Signed   By: Jill Side M.D.   On: 11/15/2022 10:53   DG C-ARM BRONCHOSCOPY  Result Date: 11/15/2022 C-ARM BRONCHOSCOPY: Fluoroscopy was utilized by the requesting physician.  No radiographic interpretation.    ASSESSMENT AND PLAN: This is a very pleasant 47 years old African-American female with Stage IIb (T2b, N1, M0) non-small cell lung cancer with unknown subtype diagnosed in January 2024 and presented with large left upper lobe lung mass in addition to left hilar adenopathy.  Molecular studies by [employer] tissues are  still pending but the blood showed no actionable mutations. The patient is here today to start the first cycle of concurrent chemoradiation with weekly carboplatin for AUC of 2 and paclitaxel 45 Mg/M2.  She is feeling fine except for the fatigue. I recommended for the patient to proceed with the first dose of her treatment as planned.  She has an appointment with Dr. Kipp Brood for surgical evaluation but unlikely to be surgical candidate. I recommended for the patient to proceed with with the first dose of her concurrent chemoradiation as planned. I will see her back for follow-up visit in 2 weeks for evaluation before starting cycle #3. For the anemia, I will send a prescription for Integra +1 capsule p.o. daily to her pharmacy. For the dyspepsia, I will send her prescription for Prilosec 20 mg p.o. daily. For the tachycardia which likely secondary to her anemia, I will arrange for the patient to receive 1 L of normal saline in the clinic today. The patient was advised to call immediately if she has any other concerning symptoms in the interval. I discussed the assessment and treatment plan with the patient. The patient was provided an opportunity to ask questions and all were answered. The patient agreed with the plan and demonstrated an understanding  of the instructions.   The patient was advised to call back or seek an in-person evaluation if the symptoms worsen or if the condition fails to improve as anticipated.  I provided 20 minutes of face-to-face video visit time during this encounter, and > 50% was spent counseling as documented under my assessment & plan.  Eilleen Kempf, MD 12/05/2022 9:12 AM  Disclaimer: This note was dictated with voice recognition software. Similar sounding words can inadvertently be transcribed and may not be corrected upon review.

## 2022-12-05 NOTE — Patient Instructions (Signed)
Elaine Gibson  Discharge Instructions: Thank you for choosing [facility] to provide your oncology and hematology care.   If you have a lab appointment with the [facility], please go directly to the [facility] and check in at the registration area.   Wear comfortable clothing and clothing appropriate for easy access to any Portacath or PICC line.   We strive to give you quality time with your provider. You may need to reschedule your appointment if you arrive late (15 or more minutes).  Arriving late affects you and other patients whose appointments are after yours.  Also, if you miss three or more appointments without notifying the office, you may be dismissed from the clinic at the provider's discretion.      For prescription refill requests, have your pharmacy contact our office and allow 72 hours for refills to be completed.    Today you received the following chemotherapy and/or immunotherapy agents : Paclitaxel, Carboplatin      To help prevent nausea and vomiting after your treatment, we encourage you to take your nausea medication as directed.  BELOW ARE SYMPTOMS THAT SHOULD BE REPORTED IMMEDIATELY: *FEVER GREATER THAN 100.4 F (38 C) OR HIGHER *CHILLS OR SWEATING *NAUSEA AND VOMITING THAT IS NOT CONTROLLED WITH YOUR NAUSEA MEDICATION *UNUSUAL SHORTNESS OF BREATH *UNUSUAL BRUISING OR BLEEDING *URINARY PROBLEMS (pain or burning when urinating, or frequent urination) *BOWEL PROBLEMS (unusual diarrhea, constipation, pain near the anus) TENDERNESS IN MOUTH AND THROAT WITH OR WITHOUT PRESENCE OF ULCERS (sore throat, sores in mouth, or a toothache) UNUSUAL RASH, SWELLING OR PAIN  UNUSUAL VAGINAL DISCHARGE OR ITCHING   Items with * indicate a potential emergency and should be followed up as soon as possible or go to the Emergency Department if any problems should occur.  Please show the CHEMOTHERAPY ALERT CARD or IMMUNOTHERAPY  ALERT CARD at check-in to the Emergency Department and triage nurse.  Should you have questions after your visit or need to cancel or reschedule your appointment, please contact [facility]  Dept: [phone]  and follow the prompts.  Office hours are 8:00 a.m. to 4:30 p.m. Monday - Friday. Please note that voicemails left after 4:00 p.m. may not be returned until the following business day.  We are closed weekends and major holidays. You have access to a nurse at all times for urgent questions. Please call the main number to the clinic Dept: [phone] and follow the prompts.   For any non-urgent questions, you may also contact your provider using MyChart. We now offer e-Visits for anyone 4 and older to request care online for non-urgent symptoms. For details visit [email or web address].   Also download the MyChart app! Go to the app store, search "MyChart", open the app, select [facility], and log in with your MyChart username and password.  Paclitaxel Injection What is this medication? PACLITAXEL (PAK li TAX el) treats some types of cancer. It works by slowing down the growth of cancer cells. This medicine may be used for other purposes; ask your health care provider or pharmacist if you have questions. COMMON BRAND NAME(S): Onxol, Taxol What should I tell my care team before I take this medication? They need to know if you have any of these conditions: Heart disease Liver disease Low white blood cell levels An unusual or allergic reaction to paclitaxel, other medications, foods, dyes, or preservatives If you or your partner are pregnant or trying  to get pregnant Breast-feeding How should I use this medication? This medication is injected into a vein. It is given by your care team in a hospital or clinic setting. Talk to your care team about the use of this medication in children. While it may be given to children for selected conditions,  precautions do apply. Overdosage: If you think you have taken too much of this medicine contact a poison control center or emergency room at once. NOTE: This medicine is only for you. Do not share this medicine with others. What if I miss a dose? Keep appointments for follow-up doses. It is important not to miss your dose. Call your care team if you are unable to keep an appointment. What may interact with this medication? Do not take this medication with any of the following: Live virus vaccines Other medications may affect the way this medication works. Talk with your care team about all of the medications you take. They may suggest changes to your treatment plan to lower the risk of side effects and to make sure your medications work as intended. This list may not describe all possible interactions. Give your health care provider a list of all the medicines, herbs, non-prescription drugs, or dietary supplements you use. Also tell them if you smoke, drink alcohol, or use illegal drugs. Some items may interact with your medicine. What should I watch for while using this medication? Your condition will be monitored carefully while you are receiving this medication. You may need blood work while taking this medication. This medication may make you feel generally unwell. This is not uncommon as chemotherapy can affect healthy cells as well as cancer cells. Report any side effects. Continue your course of treatment even though you feel ill unless your care team tells you to stop. This medication can cause serious allergic reactions. To reduce the risk, your care team may give you other medications to take before receiving this one. Be sure to follow the directions from your care team. This medication may increase your risk of getting an infection. Call your care team for advice if you get a fever, chills, sore throat, or other symptoms of a cold or flu. Do not treat yourself. Try to avoid being around  people who are sick. This medication may increase your risk to bruise or bleed. Call your care team if you notice any unusual bleeding. Be careful brushing or flossing your teeth or using a toothpick because you may get an infection or bleed more easily. If you have any dental work done, tell your dentist you are receiving this medication. Talk to your care team if you may be pregnant. Serious birth defects can occur if you take this medication during pregnancy. Talk to your care team before breastfeeding. Changes to your treatment plan may be needed. What side effects may I notice from receiving this medication? Side effects that you should report to your care team as soon as possible: Allergic reactions--skin rash, itching, hives, swelling of the face, lips, tongue, or throat Heart rhythm changes--fast or irregular heartbeat, dizziness, feeling faint or lightheaded, chest pain, trouble breathing Increase in blood pressure Infection--fever, chills, cough, sore throat, wounds that don't heal, pain or trouble when passing urine, general feeling of discomfort or being unwell Low blood pressure--dizziness, feeling faint or lightheaded, blurry vision Low red blood cell level--unusual weakness or fatigue, dizziness, headache, trouble breathing Painful swelling, warmth, or redness of the skin, blisters or sores at the infusion site Pain, tingling, or  numbness in the hands or feet Slow heartbeat--dizziness, feeling faint or lightheaded, confusion, trouble breathing, unusual weakness or fatigue Unusual bruising or bleeding Side effects that usually do not require medical attention (report to your care team if they continue or are bothersome): Diarrhea Hair loss Joint pain Loss of appetite Muscle pain Nausea Vomiting This list may not describe all possible side effects. Call your doctor for medical advice about side effects. You may report side effects to FDA at 1-800-FDA-1088. Where should I keep  my medication? This medication is given in a hospital or clinic. It will not be stored at home. NOTE: This sheet is a summary. It may not cover all possible information. If you have questions about this medicine, talk to your doctor, pharmacist, or health care provider.  2023 Elsevier/Gold Standard (2022-02-23 00:00:00) Carboplatin Injection What is this medication? CARBOPLATIN (KAR boe pla tin) treats some types of cancer. It works by slowing down the growth of cancer cells. This medicine may be used for other purposes; ask your health care provider or pharmacist if you have questions. COMMON BRAND NAME(S): Paraplatin What should I tell my care team before I take this medication? They need to know if you have any of these conditions: Blood disorders Hearing problems Kidney disease Recent or ongoing radiation therapy An unusual or allergic reaction to carboplatin, cisplatin, other medications, foods, dyes, or preservatives Pregnant or trying to get pregnant Breast-feeding How should I use this medication? This medication is injected into a vein. It is given by your care team in a hospital or clinic setting. Talk to your care team about the use of this medication in children. Special care may be needed. Overdosage: If you think you have taken too much of this medicine contact a poison control center or emergency room at once. NOTE: This medicine is only for you. Do not share this medicine with others. What if I miss a dose? Keep appointments for follow-up doses. It is important not to miss your dose. Call your care team if you are unable to keep an appointment. What may interact with this medication? Medications for seizures Some antibiotics, such as amikacin, gentamicin, neomycin, streptomycin, tobramycin Vaccines This list may not describe all possible interactions. Give your health care provider a list of all the medicines, herbs, non-prescription drugs, or dietary supplements you  use. Also tell them if you smoke, drink alcohol, or use illegal drugs. Some items may interact with your medicine. What should I watch for while using this medication? Your condition will be monitored carefully while you are receiving this medication. You may need blood work while taking this medication. This medication may make you feel generally unwell. This is not uncommon, as chemotherapy can affect healthy cells as well as cancer cells. Report any side effects. Continue your course of treatment even though you feel ill unless your care team tells you to stop. In some cases, you may be given additional medications to help with side effects. Follow all directions for their use. This medication may increase your risk of getting an infection. Call your care team for advice if you get a fever, chills, sore throat, or other symptoms of a cold or flu. Do not treat yourself. Try to avoid being around people who are sick. Avoid taking medications that contain aspirin, acetaminophen, ibuprofen, naproxen, or ketoprofen unless instructed by your care team. These medications may hide a fever. Be careful brushing or flossing your teeth or using a toothpick because you may get an  infection or bleed more easily. If you have any dental work done, tell your dentist you are receiving this medication. Talk to your care team if you wish to become pregnant or think you might be pregnant. This medication can cause serious birth defects. Talk to your care team about effective forms of contraception. Do not breast-feed while taking this medication. What side effects may I notice from receiving this medication? Side effects that you should report to your care team as soon as possible: Allergic reactions--skin rash, itching, hives, swelling of the face, lips, tongue, or throat Infection--fever, chills, cough, sore throat, wounds that don't heal, pain or trouble when passing urine, general feeling of discomfort or being  unwell Low red blood cell level--unusual weakness or fatigue, dizziness, headache, trouble breathing Pain, tingling, or numbness in the hands or feet, muscle weakness, change in vision, confusion or trouble speaking, loss of balance or coordination, trouble walking, seizures Unusual bruising or bleeding Side effects that usually do not require medical attention (report to your care team if they continue or are bothersome): Hair loss Nausea Unusual weakness or fatigue Vomiting This list may not describe all possible side effects. Call your doctor for medical advice about side effects. You may report side effects to FDA at 1-800-FDA-1088. Where should I keep my medication? This medication is given in a hospital or clinic. It will not be stored at home. NOTE: This sheet is a summary. It may not cover all possible information. If you have questions about this medicine, talk to your doctor, pharmacist, or health care provider.  2023 Elsevier/Gold Standard (2022-02-07 00:00:00)

## 2022-12-05 NOTE — Research (Signed)
[employer] 2021-05 - Specimen Collection Study to Evaluate Biomarkers in Subjects with Cancer   Medical History:  High Blood Pressure  No Coronary Artery Disease No Lupus    No Rheumatoid Arthritis  No Diabetes   No      Lynch Syndrome  No  Is the patient currently taking a magnesium supplement?   No  Does the patient have a personal history of cancer (greater than 5 years ago)?  No  Does the patient have a family history of cancer in 1st or 2nd degree relatives? Yes If yes, Relationship(s) and Cancer type(s)? Father: stomach  Does the patient have history of alcohol consumption? No    Does the patient have history of cigarette, cigar, pipe, or chewing tobacco use?  No    This Nurse has reviewed this patient's inclusion and exclusion criteria and confirmed Elaine Gibson is eligible for study participation.  Patient will continue with enrollment.  Eligibility confirmed by treating investigator, who also agrees that patient should proceed with enrollment.  Marjie Skiff Marleena Shubert, RN, BSN, [facility]  [facility] Clinical Research Nurse [facility] Direct Dial [phone]  Pager [phone] 12/05/2022 8:32 AM

## 2022-12-05 NOTE — Research (Signed)
[employer] 2021-05 - Specimen Collection Study to Evaluate Biomarkers in Subjects with Cancer     This Coordinator has reviewed this patient's inclusion and exclusion criteria as a second review and confirms Elaine Gibson is eligible for study participation.  Patient may continue with enrollment.   Johny Drilling, [facility] 12/05/2022 8:42 AM

## 2022-12-05 NOTE — Research (Signed)
Trial Name:  [employer] 2021-05 - Specimen Collection Study to Evaluate Biomarkers in Subjects with Cancer   Patient Elaine Gibson was identified by Dr Julien Nordmann as a potential candidate for the above listed study.  This Clinical Research Nurse met with Elaine Gibson, [id number] on 12/05/22 in a manner and location that ensures patient privacy to discuss participation in the above listed research study.  Patient is Unaccompanied.  Patient was previously provided with informed consent documents.  Patient confirmed they have read the informed consent documents.  As outlined in the informed consent form, this Nurse and [name] discussed the purpose of the research study, the investigational nature of the study, study procedures and requirements for study participation, potential risks and benefits of study participation, as well as alternatives to participation.  This study is not blinded or double-blinded. The patient understands participation is voluntary and they may withdraw from study participation at any time.  This study does not involve randomization.  This study does not involve an investigational drug or device. This study does not involve a placebo. Patient understands enrollment is pending full eligibility review.   Confidentiality and how the patient's information will be used as part of study participation were discussed.  Patient was informed there is reimbursement provided for their time and effort spent on trial participation.  The patient is encouraged to discuss research study participation with their insurance provider to determine what costs they may incur as part of study participation, including research related injury.    All questions were answered to patient's satisfaction.  The informed consent with embedded HIPAA language was reviewed page by page.  The patient's mental and emotional status is appropriate to provide informed consent, and the patient  verbalizes an understanding of study participation.  Patient has agreed to participate in the above listed research study and has voluntarily signed the informed consent version 06 Dec 2021 with embedded HIPAA language, version 06 Dec 2021  on 12/05/22 at 0830AM.  The patient was provided with a copy of the signed informed consent form with embedded HIPAA language for their reference.  No study specific procedures were obtained prior to the signing of the informed consent document.  Approximately 15 minutes were spent with the patient reviewing the informed consent documents.  Patient has chosen to not complete a Release of Information form at this time.  Research staff accompanied patient to lab for blood draw and provided gift card.  Marjie Skiff Finn Altemose, RN, BSN, [facility] She  Her  Hers Clinical Research Nurse [facility] Direct Dial [phone]  Pager [phone] 12/05/2022 8:30 AM

## 2022-12-05 NOTE — Progress Notes (Signed)
Nutrition Assessment:  Referral for weight loss  47 year old female with non small cell lung cancer.  Past medical history of alpha - 1 antritrypsin deficiency carrier, emphysema, scoliosis.  Patient starting concurrent chemotherapy and radiation.   Met with patient today during first infusion.  Patient sleepy from benadryl given.  Reports for the month of December only able to eat ice chips due to having the flu.  Noted hospital admission.  Reports appetite is better.  Has tried ensure shakes but not drinking daily.  Wanting to increase her weight.     Medications: compazine MD starting prilosec for GERD today and integra for anemia  Labs: Na 133, glucose 138  Anthropometrics:   Height: 65 inches Weight: 83 lb 12.8 oz today 88 lb 1/17 98 lb on 9/19 BMI: 13  15% weight loss in the last 4 months  Estimated Energy Needs  Kcals: 1200-1300 Protein: 60-65 g Fluid: 1200-1300 ml  NUTRITION DIAGNOSIS: Inadequate oral intake related to recent flu, cancer, hospital admission as evidenced by 15% weight loss in the last 4 months and eating less than 75% estimated energy needs for the past month.     MALNUTRITION DIAGNOSIS:  Patient meets criteria for severe malnutrition in context of chronic illness as evidenced by 15% weight loss in the last 4 months and eating less than 75% of estimated energy needs for the past month.    INTERVENTION:  Encouraged drinking 350 calorie shake 1-3 times per day between meals. Coupons provided Discussed ways to add calories and protein to diet.  Handout provided Contact information provided    MONITORING, EVALUATION, GOAL: weight trends, intake   NEXT VISIT: Monday, Feb 12 during infusion  Drystan Reader B. Zenia Resides, [place], [place] Registered Dietitian [phone]

## 2022-12-06 ENCOUNTER — Other Ambulatory Visit: Payer: Self-pay

## 2022-12-06 ENCOUNTER — Ambulatory Visit
Admission: RE | Admit: 2022-12-06 | Discharge: 2022-12-06 | Disposition: A | Discharge status: Home / Self Care | Payer: BC Managed Care – PPO | Source: Ambulatory Visit | Attending: Radiation Oncology | Admitting: Radiation Oncology

## 2022-12-06 ENCOUNTER — Encounter: Payer: Self-pay | Admitting: *Deleted

## 2022-12-06 DIAGNOSIS — Z5111 Encounter for antineoplastic chemotherapy: Secondary | ICD-10-CM | POA: Diagnosis not present

## 2022-12-06 LAB — RAD ONC ARIA SESSION SUMMARY
Course Elapsed Days: 1
Plan Fractions Treated to Date: 2
Plan Prescribed Dose Per Fraction: 2 Gy
Plan Total Fractions Prescribed: 30
Plan Total Prescribed Dose: 60 Gy
Reference Point Dosage Given to Date: 4 Gy
Reference Point Session Dosage Given: 2 Gy
Session Number: 2

## 2022-12-07 ENCOUNTER — Ambulatory Visit
Admission: RE | Admit: 2022-12-07 | Discharge: 2022-12-07 | Disposition: A | Discharge status: Home / Self Care | Payer: BC Managed Care – PPO | Source: Ambulatory Visit | Attending: Radiation Oncology | Admitting: Radiation Oncology

## 2022-12-07 ENCOUNTER — Encounter (HOSPITAL_BASED_OUTPATIENT_CLINIC_OR_DEPARTMENT_OTHER): Payer: Self-pay

## 2022-12-07 ENCOUNTER — Institutional Professional Consult (permissible substitution) (HOSPITAL_BASED_OUTPATIENT_CLINIC_OR_DEPARTMENT_OTHER): Payer: BC Managed Care – PPO | Admitting: Pulmonary Disease

## 2022-12-07 ENCOUNTER — Other Ambulatory Visit: Payer: Self-pay

## 2022-12-07 DIAGNOSIS — Z5111 Encounter for antineoplastic chemotherapy: Secondary | ICD-10-CM | POA: Diagnosis not present

## 2022-12-07 LAB — RAD ONC ARIA SESSION SUMMARY
Course Elapsed Days: 2
Plan Fractions Treated to Date: 3
Plan Prescribed Dose Per Fraction: 2 Gy
Plan Total Fractions Prescribed: 30
Plan Total Prescribed Dose: 60 Gy
Reference Point Dosage Given to Date: 6 Gy
Reference Point Session Dosage Given: 2 Gy
Session Number: 3

## 2022-12-08 ENCOUNTER — Telehealth: Payer: Self-pay | Admitting: Radiation Oncology

## 2022-12-08 ENCOUNTER — Ambulatory Visit
Admission: RE | Admit: 2022-12-08 | Discharge: 2022-12-08 | Disposition: A | Discharge status: Home / Self Care | Payer: BC Managed Care – PPO | Source: Ambulatory Visit | Attending: Radiation Oncology | Admitting: Radiation Oncology

## 2022-12-08 ENCOUNTER — Other Ambulatory Visit: Payer: Self-pay

## 2022-12-08 DIAGNOSIS — Z5111 Encounter for antineoplastic chemotherapy: Secondary | ICD-10-CM | POA: Diagnosis not present

## 2022-12-08 DIAGNOSIS — Z510 Encounter for antineoplastic radiation therapy: Secondary | ICD-10-CM | POA: Diagnosis present

## 2022-12-08 DIAGNOSIS — J439 Emphysema, unspecified: Secondary | ICD-10-CM | POA: Diagnosis not present

## 2022-12-08 DIAGNOSIS — Z79899 Other long term (current) drug therapy: Secondary | ICD-10-CM | POA: Diagnosis not present

## 2022-12-08 DIAGNOSIS — C3492 Malignant neoplasm of unspecified part of left bronchus or lung: Secondary | ICD-10-CM | POA: Insufficient documentation

## 2022-12-08 DIAGNOSIS — R590 Localized enlarged lymph nodes: Secondary | ICD-10-CM | POA: Diagnosis not present

## 2022-12-08 DIAGNOSIS — C3412 Malignant neoplasm of upper lobe, left bronchus or lung: Secondary | ICD-10-CM | POA: Diagnosis present

## 2022-12-08 LAB — RAD ONC ARIA SESSION SUMMARY
Course Elapsed Days: 3
Plan Fractions Treated to Date: 4
Plan Prescribed Dose Per Fraction: 2 Gy
Plan Total Fractions Prescribed: 30
Plan Total Prescribed Dose: 60 Gy
Reference Point Dosage Given to Date: 8 Gy
Reference Point Session Dosage Given: 2 Gy
Session Number: 4

## 2022-12-08 NOTE — Telephone Encounter (Signed)
I attempted reaching the patient at all the numbers listed for her but two are not in service and her spouses is not in service. We will see her tomorrow during her under treatment visit to determine how we can help the symptom she noted yesterday of nausea.

## 2022-12-09 ENCOUNTER — Other Ambulatory Visit: Payer: Self-pay

## 2022-12-09 ENCOUNTER — Ambulatory Visit
Admission: RE | Admit: 2022-12-09 | Discharge: 2022-12-09 | Disposition: A | Discharge status: Home / Self Care | Payer: BC Managed Care – PPO | Source: Ambulatory Visit | Attending: Radiation Oncology | Admitting: Radiation Oncology

## 2022-12-09 ENCOUNTER — Encounter: Payer: BC Managed Care – PPO | Admitting: Thoracic Surgery (Cardiothoracic Vascular Surgery)

## 2022-12-09 DIAGNOSIS — C3412 Malignant neoplasm of upper lobe, left bronchus or lung: Secondary | ICD-10-CM | POA: Diagnosis not present

## 2022-12-09 DIAGNOSIS — C3492 Malignant neoplasm of unspecified part of left bronchus or lung: Secondary | ICD-10-CM

## 2022-12-09 LAB — RAD ONC ARIA SESSION SUMMARY
Course Elapsed Days: 4
Plan Fractions Treated to Date: 5
Plan Prescribed Dose Per Fraction: 2 Gy
Plan Total Fractions Prescribed: 30
Plan Total Prescribed Dose: 60 Gy
Reference Point Dosage Given to Date: 10 Gy
Reference Point Session Dosage Given: 2 Gy
Session Number: 5

## 2022-12-09 MED ORDER — SONAFINE EX EMUL
1.0000 | Freq: Two times a day (BID) | CUTANEOUS | Status: DC
  Administered 2022-12-09: 1 via TOPICAL

## 2022-12-09 MED FILL — Dexamethasone Sodium Phosphate Inj 100 MG/10ML: INTRAMUSCULAR | Qty: 1 | Status: AC

## 2022-12-09 NOTE — Progress Notes (Signed)
Pt here for patient teaching. Pt given Radiation and You booklet, skin care instructions, and Sonafine. Reviewed areas of pertinence such as fatigue, hair loss, nausea and vomiting, skin changes, throat changes, headache, breast tenderness, breast swelling, cough, and shortness of breath. Pt able to give teach back of to pat skin, use unscented/gentle soap, and drink plenty of water, apply Sonafine bid, avoid applying anything to skin within 4 hours of treatment, avoid wearing an under wire bra, and to use an electric razor if they must shave. Pt verbalizes understanding of information given and will contact nursing with any questions or concerns.     [email or web address]

## 2022-12-12 ENCOUNTER — Other Ambulatory Visit: Payer: Self-pay

## 2022-12-12 ENCOUNTER — Inpatient Hospital Stay: Payer: BC Managed Care – PPO

## 2022-12-12 ENCOUNTER — Other Ambulatory Visit: Payer: Self-pay | Admitting: Physician Assistant

## 2022-12-12 ENCOUNTER — Other Ambulatory Visit: Payer: Self-pay | Admitting: Medical Oncology

## 2022-12-12 ENCOUNTER — Ambulatory Visit
Admission: RE | Admit: 2022-12-12 | Discharge: 2022-12-12 | Disposition: A | Discharge status: Home / Self Care | Payer: BC Managed Care – PPO | Source: Ambulatory Visit | Attending: Radiation Oncology | Admitting: Radiation Oncology

## 2022-12-12 ENCOUNTER — Inpatient Hospital Stay: Payer: BC Managed Care – PPO | Attending: Internal Medicine

## 2022-12-12 ENCOUNTER — Telehealth: Payer: Self-pay | Admitting: Medical Oncology

## 2022-12-12 ENCOUNTER — Other Ambulatory Visit: Payer: Self-pay | Admitting: Internal Medicine

## 2022-12-12 VITALS — BP 86/58 | HR 88 | Temp 97.8°F | Resp 17 | Wt 88.8 lb

## 2022-12-12 DIAGNOSIS — R590 Localized enlarged lymph nodes: Secondary | ICD-10-CM | POA: Insufficient documentation

## 2022-12-12 DIAGNOSIS — J439 Emphysema, unspecified: Secondary | ICD-10-CM | POA: Insufficient documentation

## 2022-12-12 DIAGNOSIS — R911 Solitary pulmonary nodule: Secondary | ICD-10-CM

## 2022-12-12 DIAGNOSIS — D649 Anemia, unspecified: Secondary | ICD-10-CM

## 2022-12-12 DIAGNOSIS — D6481 Anemia due to antineoplastic chemotherapy: Secondary | ICD-10-CM

## 2022-12-12 DIAGNOSIS — C3492 Malignant neoplasm of unspecified part of left bronchus or lung: Secondary | ICD-10-CM

## 2022-12-12 DIAGNOSIS — C3490 Malignant neoplasm of unspecified part of unspecified bronchus or lung: Secondary | ICD-10-CM | POA: Insufficient documentation

## 2022-12-12 DIAGNOSIS — Z79899 Other long term (current) drug therapy: Secondary | ICD-10-CM | POA: Insufficient documentation

## 2022-12-12 DIAGNOSIS — Z510 Encounter for antineoplastic radiation therapy: Secondary | ICD-10-CM | POA: Insufficient documentation

## 2022-12-12 DIAGNOSIS — C3412 Malignant neoplasm of upper lobe, left bronchus or lung: Secondary | ICD-10-CM | POA: Diagnosis not present

## 2022-12-12 DIAGNOSIS — Z5111 Encounter for antineoplastic chemotherapy: Secondary | ICD-10-CM | POA: Insufficient documentation

## 2022-12-12 LAB — CBC WITH DIFFERENTIAL (CANCER CENTER ONLY)
Abs Immature Granulocytes: 0.11 10*3/uL — ABNORMAL HIGH (ref 0.00–0.07)
Basophils Absolute: 0 10*3/uL (ref 0.0–0.1)
Basophils Relative: 0 %
Eosinophils Absolute: 0.1 10*3/uL (ref 0.0–0.5)
Eosinophils Relative: 1 %
HCT: 20.6 % — ABNORMAL LOW (ref 36.0–46.0)
Hemoglobin: 6.4 g/dL — CL (ref 12.0–15.0)
Immature Granulocytes: 1 %
Lymphocytes Relative: 13 %
Lymphs Abs: 1.2 10*3/uL (ref 0.7–4.0)
MCH: 23.2 pg — ABNORMAL LOW (ref 26.0–34.0)
MCHC: 31.1 g/dL (ref 30.0–36.0)
MCV: 74.6 fL — ABNORMAL LOW (ref 80.0–100.0)
Monocytes Absolute: 1 10*3/uL (ref 0.1–1.0)
Monocytes Relative: 11 %
Neutro Abs: 6.6 10*3/uL (ref 1.7–7.7)
Neutrophils Relative %: 74 %
Platelet Count: 441 10*3/uL — ABNORMAL HIGH (ref 150–400)
RBC: 2.76 MIL/uL — ABNORMAL LOW (ref 3.87–5.11)
RDW: 16.7 % — ABNORMAL HIGH (ref 11.5–15.5)
WBC Count: 9 10*3/uL (ref 4.0–10.5)
nRBC: 0 % (ref 0.0–0.2)

## 2022-12-12 LAB — RAD ONC ARIA SESSION SUMMARY
Course Elapsed Days: 7
Plan Fractions Treated to Date: 6
Plan Prescribed Dose Per Fraction: 2 Gy
Plan Total Fractions Prescribed: 30
Plan Total Prescribed Dose: 60 Gy
Reference Point Dosage Given to Date: 12 Gy
Reference Point Session Dosage Given: 2 Gy
Session Number: 6

## 2022-12-12 LAB — CMP (CANCER CENTER ONLY)
ALT: 20 U/L (ref 0–44)
AST: 23 U/L (ref 15–41)
Albumin: 2.7 g/dL — ABNORMAL LOW (ref 3.5–5.0)
Alkaline Phosphatase: 200 U/L — ABNORMAL HIGH (ref 38–126)
Anion gap: 9 (ref 5–15)
BUN: 11 mg/dL (ref 6–20)
CO2: 24 mmol/L (ref 22–32)
Calcium: 8.6 mg/dL — ABNORMAL LOW (ref 8.9–10.3)
Chloride: 100 mmol/L (ref 98–111)
Creatinine: 0.54 mg/dL (ref 0.44–1.00)
GFR, Estimated: >60 mL/min (ref >60)
Glucose, Bld: 98 mg/dL (ref 70–99)
Potassium: 4.4 mmol/L (ref 3.5–5.1)
Sodium: 133 mmol/L — ABNORMAL LOW (ref 135–145)
Total Bilirubin: 0.3 mg/dL (ref 0.3–1.2)
Total Protein: 6.1 g/dL — ABNORMAL LOW (ref 6.5–8.1)

## 2022-12-12 LAB — ABO/RH: ABO/RH(D): O POS

## 2022-12-12 LAB — PREPARE RBC (CROSSMATCH)

## 2022-12-12 MED ORDER — SODIUM CHLORIDE 0.9% IV SOLUTION: 250.0000 mL | Freq: Once | INTRAVENOUS | Status: DC

## 2022-12-12 MED ORDER — OXYCODONE-ACETAMINOPHEN 5-325 MG PO TABS: 1.0000 | ORAL_TABLET | Freq: Three times a day (TID) | ORAL | 0 refills | Status: DC | PRN

## 2022-12-12 MED ORDER — DIPHENHYDRAMINE HCL 50 MG/ML IJ SOLN
50.0000 mg | Freq: Once | INTRAMUSCULAR | Status: AC
  Administered 2022-12-12: 50 mg via INTRAVENOUS
  Filled 2022-12-12: qty 1

## 2022-12-12 MED ORDER — FAMOTIDINE IN NACL 20-0.9 MG/50ML-% IV SOLN
20.0000 mg | Freq: Once | INTRAVENOUS | Status: AC
  Administered 2022-12-12: 20 mg via INTRAVENOUS
  Filled 2022-12-12: qty 50

## 2022-12-12 MED ORDER — SODIUM CHLORIDE 0.9 % IV SOLN
157.4000 mg | Freq: Once | INTRAVENOUS | Status: AC
  Administered 2022-12-12: 150 mg via INTRAVENOUS
  Filled 2022-12-12: qty 15

## 2022-12-12 MED ORDER — DIPHENHYDRAMINE HCL 25 MG PO CAPS: 25.0000 mg | ORAL_CAPSULE | Freq: Once | ORAL | Status: DC

## 2022-12-12 MED ORDER — SODIUM CHLORIDE 0.9 % IV SOLN: Freq: Once | INTRAVENOUS | Status: AC

## 2022-12-12 MED ORDER — SODIUM CHLORIDE 0.9 % IV SOLN
45.0000 mg/m2 | Freq: Once | INTRAVENOUS | Status: AC
  Administered 2022-12-12: 60 mg via INTRAVENOUS
  Filled 2022-12-12: qty 10

## 2022-12-12 MED ORDER — ACETAMINOPHEN 325 MG PO TABS
650.0000 mg | ORAL_TABLET | Freq: Once | ORAL | Status: AC
  Administered 2022-12-12: 650 mg via ORAL
  Filled 2022-12-12: qty 2

## 2022-12-12 MED ORDER — PALONOSETRON HCL INJECTION 0.25 MG/5ML
0.2500 mg | Freq: Once | INTRAVENOUS | Status: AC
  Administered 2022-12-12: 0.25 mg via INTRAVENOUS
  Filled 2022-12-12: qty 5

## 2022-12-12 MED ORDER — SODIUM CHLORIDE 0.9 % IV SOLN
10.0000 mg | Freq: Once | INTRAVENOUS | Status: AC
  Administered 2022-12-12: 10 mg via INTRAVENOUS
  Filled 2022-12-12: qty 10

## 2022-12-12 MED ORDER — OXYCODONE HCL 5 MG PO TABS
5.0000 mg | ORAL_TABLET | ORAL | Status: AC
  Administered 2022-12-12: 5 mg via ORAL
  Filled 2022-12-12: qty 1

## 2022-12-12 NOTE — Progress Notes (Signed)
Per Dr. Julien Nordmann , it its ok to treat pt with Carboplatin and Taxol and heart rate of 129 and hgb of 6.4.

## 2022-12-12 NOTE — Patient Instructions (Signed)
[facility]  Discharge Instructions: Thank you for choosing [facility] to provide your oncology and hematology care.   If you have a lab appointment with the [facility], please go directly to the [facility] and check in at the registration area.   Wear comfortable clothing and clothing appropriate for easy access to any Portacath or PICC line.   We strive to give you quality time with your provider. You may need to reschedule your appointment if you arrive late (15 or more minutes).  Arriving late affects you and other patients whose appointments are after yours.  Also, if you miss three or more appointments without notifying the office, you may be dismissed from the clinic at the provider's discretion.      For prescription refill requests, have your pharmacy contact our office and allow 72 hours for refills to be completed.    Today you received the following chemotherapy and/or immunotherapy agents: Paclitaxel, Carboplatin.       To help prevent nausea and vomiting after your treatment, we encourage you to take your nausea medication as directed.  BELOW ARE SYMPTOMS THAT SHOULD BE REPORTED IMMEDIATELY: *FEVER GREATER THAN 100.4 F (38 C) OR HIGHER *CHILLS OR SWEATING *NAUSEA AND VOMITING THAT IS NOT CONTROLLED WITH YOUR NAUSEA MEDICATION *UNUSUAL SHORTNESS OF BREATH *UNUSUAL BRUISING OR BLEEDING *URINARY PROBLEMS (pain or burning when urinating, or frequent urination) *BOWEL PROBLEMS (unusual diarrhea, constipation, pain near the anus) TENDERNESS IN MOUTH AND THROAT WITH OR WITHOUT PRESENCE OF ULCERS (sore throat, sores in mouth, or a toothache) UNUSUAL RASH, SWELLING OR PAIN  UNUSUAL VAGINAL DISCHARGE OR ITCHING   Items with * indicate a potential emergency and should be followed up as soon as possible or go to the Emergency Department if any problems should occur.  Please show the CHEMOTHERAPY ALERT CARD or IMMUNOTHERAPY  ALERT CARD at check-in to the Emergency Department and triage nurse.  Should you have questions after your visit or need to cancel or reschedule your appointment, please contact [facility]  Dept: [phone]  and follow the prompts.  Office hours are 8:00 a.m. to 4:30 p.m. Monday - Friday. Please note that voicemails left after 4:00 p.m. may not be returned until the following business day.  We are closed weekends and major holidays. You have access to a nurse at all times for urgent questions. Please call the main number to the clinic Dept: [phone] and follow the prompts.   For any non-urgent questions, you may also contact your provider using MyChart. We now offer e-Visits for anyone 36 and older to request care online for non-urgent symptoms. For details visit [email or web address].   Also download the MyChart app! Go to the app store, search "MyChart", open the app, select [facility], and log in with your MyChart username and password.  Blood Transfusion, Adult, Care After The following information offers guidance on how to care for yourself after your procedure. Your health care provider may also give you more specific instructions. If you have problems or questions, contact your health care provider. What can I expect after the procedure? After the procedure, it is common to have: Bruising and soreness where the IV was inserted. A headache. Follow these instructions at home: IV insertion site care     Follow instructions from your health care provider about how to take care of your IV insertion site. Make sure you: Wash your hands with soap  and water for at least 20 seconds before and after you change your bandage (dressing). If soap and water are not available, use hand sanitizer. Change your dressing as told by your health care provider. Check your IV insertion site every day for signs of infection. Check for: Redness, swelling, or  pain. Bleeding from the site. Warmth. Pus or a bad smell. General instructions Take over-the-counter and prescription medicines only as told by your health care provider. Rest as told by your health care provider. Return to your normal activities as told by your health care provider. Keep all follow-up visits. Lab tests may need to be done at certain periods to recheck your blood counts. Contact a health care provider if: You have itching or red, swollen areas of skin (hives). You have a fever or chills. You have pain in the head, back, or chest. You feel anxious or you feel weak after doing your normal activities. You have redness, swelling, warmth, or pain around the IV insertion site. You have blood coming from the IV insertion site that does not stop with pressure. You have pus or a bad smell coming from your IV insertion site. If you received your blood transfusion in an outpatient setting, you will be told whom to contact to report any reactions. Get help right away if: You have symptoms of a serious allergic or immune system reaction, including: Trouble breathing or shortness of breath. Swelling of the face, feeling flushed, or widespread rash. Dark urine or blood in the urine. Fast heartbeat. These symptoms may be an emergency. Get help right away. Call 911. Do not wait to see if the symptoms will go away. Do not drive yourself to the hospital. Summary Bruising and soreness around the IV insertion site are common. Check your IV insertion site every day for signs of infection. Rest as told by your health care provider. Return to your normal activities as told by your health care provider. Get help right away for symptoms of a serious allergic or immune system reaction to the blood transfusion. This information is not intended to replace advice given to you by your health care provider. Make sure you discuss any questions you have with your health care provider. Document  Revised: 01/21/2022 Document Reviewed: 01/21/2022 Elsevier Patient Education  [employer].

## 2022-12-12 NOTE — Progress Notes (Signed)
Patient has complaints of pain with deep inspiration and coughing. Sometimes the pain occurs with activity however mostly with coughing. She also has significant pain in her right shoulder. This is a new pain, intermittent sharp stabbing pains- coughing aggravates the pain. She has been taking tylenol and this helps but does not last long and it does not resolve the pain especially when she coughs.   MD notified and medication was ordered.

## 2022-12-12 NOTE — Telephone Encounter (Signed)
CRITICAL VALUE STICKER  CRITICAL VALUE: HGB 6.4  RECEIVER (on-site recipient of call): Joy Puryear  DATE & TIME NOTIFIED: 12/12/22 @1227   MESSENGER (representative from lab): Pam  MD NOTIFIED: Julien Nordmann  TIME OF NOTIFICATION: 5258  RESPONSE:   transfuse two units of blood

## 2022-12-13 ENCOUNTER — Inpatient Hospital Stay: Payer: BC Managed Care – PPO

## 2022-12-13 ENCOUNTER — Inpatient Hospital Stay: Payer: BC Managed Care – PPO | Admitting: Licensed Clinical Social Worker

## 2022-12-13 ENCOUNTER — Telehealth: Payer: Self-pay | Admitting: *Deleted

## 2022-12-13 ENCOUNTER — Encounter: Payer: Self-pay | Admitting: Internal Medicine

## 2022-12-13 ENCOUNTER — Telehealth: Payer: Self-pay | Admitting: Medical Oncology

## 2022-12-13 ENCOUNTER — Ambulatory Visit: Admission: RE | Admit: 2022-12-13 | Payer: BC Managed Care – PPO | Source: Ambulatory Visit

## 2022-12-13 DIAGNOSIS — C3412 Malignant neoplasm of upper lobe, left bronchus or lung: Secondary | ICD-10-CM | POA: Diagnosis not present

## 2022-12-13 DIAGNOSIS — D649 Anemia, unspecified: Secondary | ICD-10-CM

## 2022-12-13 DIAGNOSIS — C3492 Malignant neoplasm of unspecified part of left bronchus or lung: Secondary | ICD-10-CM

## 2022-12-13 LAB — PREPARE RBC (CROSSMATCH)

## 2022-12-13 MED ORDER — ACETAMINOPHEN 325 MG PO TABS
650.0000 mg | ORAL_TABLET | Freq: Once | ORAL | Status: AC
  Administered 2022-12-13: 650 mg via ORAL
  Filled 2022-12-13: qty 2

## 2022-12-13 MED ORDER — SODIUM CHLORIDE 0.9% IV SOLUTION
250.0000 mL | Freq: Once | INTRAVENOUS | Status: AC
  Administered 2022-12-13: 250 mL via INTRAVENOUS

## 2022-12-13 NOTE — Telephone Encounter (Signed)
Oxycodone needs PA.  Message sent to PA staff.

## 2022-12-13 NOTE — Progress Notes (Signed)
[facility]  Initial Assessment   Elaine Gibson is a 47 y.o. year old female presenting alone. Clinical Social Work was referred by medical provider for assessment of psychosocial needs.   SDOH (Social Determinants of Health) assessments performed: Yes   SDOH Screenings   Food Insecurity: No Food Insecurity (10/31/2022)  Housing: Low Risk  (10/31/2022)  Transportation Needs: No Transportation Needs (10/31/2022)  Utilities: Not At Risk (10/31/2022)  Tobacco Use: Medium Risk (11/23/2022)     Distress Screen completed: Yes    11/21/2022    2:37 PM  ONCBCN DISTRESS SCREENING  Screening Type Initial Screening  Distress experienced in past week (1-10) 9  Practical problem type Food  Information Concerns Type Lack of info about diagnosis      Family/Social Information:  Housing Arrangement: patient lives alone Family members/support persons in your life? 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  Income source: no income presently, pt has been unable to work since the end of January.  Paperwork has been submitted for short term disability. 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 yes Concerns about diagnosis and/or treatment: Losing my job and/or losing income, Overwhelmed by information, and How will I care for myself Patient reported stressors: Finances, Anxiety/ nervousness, and Adjusting to my illness Hopes and/or priorities: Pt's priority is to engage in treatment w/ the hope of positive results. Patient enjoys  not  addressed Current coping skills/ strengths: Capable of independent living , Motivation for treatment/growth , and Physical Health     SUMMARY: Current SDOH Barriers:  Financial constraints related to loss of income while undergoing treatment  Clinical Social Work Clinical Goal(s):  Explore community resource options for unmet needs related to:  Financial Strain   Interventions: Discussed common feeling and emotions when being diagnosed with cancer, and the importance of support during treatment Informed patient of the support team roles and support services at [facility] Provided [place] contact information and encouraged patient to call with any questions or concerns Referred patient to financial resource specialist to apply for the [employer], paperwork has been submitted for short term disability through employer, pt provided with [employer] card.   Follow Up Plan: Patient will contact CSW with any support or resource needs Patient verbalizes understanding of plan: Yes    Henriette Combs, LCSW

## 2022-12-13 NOTE — Progress Notes (Signed)
Called pt in behalf of Marguarite Arbour to discuss the [employer].  I left a msg requesting she return my call to go over what's needed to apply.

## 2022-12-13 NOTE — Telephone Encounter (Signed)
Noted and attempted to process CoverMyMeds (CMM) KEY: [id number], [id number]. Patient has Medicaid.  Called [employer] [phone]) per CCM's information.  Per representative [place], [place] request are not accepted via telephone.  Provided [email or web address] for new PA's using ID no#:  [id number] if needed.  May also complete "Medication Prior Authorization Request Form" marked to expedite for twelve-to-twenty-four- hour response fax to [phone]."   PromptPA website experiencing difficulties. Faxed PA form received "Successful [place] Notice".  Awaiting faxed response.

## 2022-12-14 ENCOUNTER — Ambulatory Visit
Admission: RE | Admit: 2022-12-14 | Discharge: 2022-12-14 | Disposition: A | Discharge status: Home / Self Care | Payer: BC Managed Care – PPO | Source: Ambulatory Visit | Attending: Radiation Oncology | Admitting: Radiation Oncology

## 2022-12-14 ENCOUNTER — Other Ambulatory Visit: Payer: Self-pay

## 2022-12-14 ENCOUNTER — Telehealth: Payer: Self-pay | Admitting: Medical Oncology

## 2022-12-14 DIAGNOSIS — C3412 Malignant neoplasm of upper lobe, left bronchus or lung: Secondary | ICD-10-CM | POA: Diagnosis not present

## 2022-12-14 LAB — TYPE AND SCREEN
ABO/RH(D): O POS
Antibody Screen: NEGATIVE
Unit division: 0
Unit division: 0

## 2022-12-14 LAB — BPAM RBC
Blood Product Expiration Date: 202403062359
Blood Product Expiration Date: 202403062359
ISSUE DATE / TIME: 202402051621
ISSUE DATE / TIME: 202402060931
Unit Type and Rh: 5100
Unit Type and Rh: 5100

## 2022-12-14 LAB — RAD ONC ARIA SESSION SUMMARY
Course Elapsed Days: 9
Plan Fractions Treated to Date: 8
Plan Prescribed Dose Per Fraction: 2 Gy
Plan Total Fractions Prescribed: 30
Plan Total Prescribed Dose: 60 Gy
Reference Point Dosage Given to Date: 16 Gy
Reference Point Session Dosage Given: 2 Gy
Session Number: 7

## 2022-12-14 NOTE — Telephone Encounter (Signed)
Pain med rx- It is still in PA process. Pharmacy will fill today for 7 days ( 21 tablets). This will void the rx for 30 tablets.  Pt notified that she can pick up pain med tonight and told to keep appt Monday with Dr. Julien Nordmann.   Swelling-"Overnight both my knees are tight and face swelling "chubby" . Denies dyspnea, pain in legs.  I instructed her to seek emergency care for any sob/chest pain, pain in legs. She voiced understanding.

## 2022-12-15 ENCOUNTER — Ambulatory Visit
Admission: RE | Admit: 2022-12-15 | Discharge: 2022-12-15 | Disposition: A | Discharge status: Home / Self Care | Payer: BC Managed Care – PPO | Source: Ambulatory Visit | Attending: Radiation Oncology | Admitting: Radiation Oncology

## 2022-12-15 ENCOUNTER — Telehealth: Payer: Self-pay

## 2022-12-15 ENCOUNTER — Other Ambulatory Visit: Payer: Self-pay

## 2022-12-15 DIAGNOSIS — C3412 Malignant neoplasm of upper lobe, left bronchus or lung: Secondary | ICD-10-CM | POA: Diagnosis not present

## 2022-12-15 LAB — RAD ONC ARIA SESSION SUMMARY
Course Elapsed Days: 10
Plan Fractions Treated to Date: 9
Plan Prescribed Dose Per Fraction: 2 Gy
Plan Total Fractions Prescribed: 30
Plan Total Prescribed Dose: 60 Gy
Reference Point Dosage Given to Date: 18 Gy
Reference Point Session Dosage Given: 2 Gy
Session Number: 8

## 2022-12-15 NOTE — Telephone Encounter (Signed)
This nurse searched prior authorization status per [employer]ef[employer] PromptPA website.  Reads Oxycodone-Acetaminophen 5-325 mg EOC [id number] status is approved effective 12/13/2022 at 1721.

## 2022-12-15 NOTE — Telephone Encounter (Signed)
Notified Patient of prior authorization approval for Oxycodone with Acetaminophen 5/325 mg Tablets. Medication is approved for 30 tablets per 10 days. No other needs or concerns voiced at this time.

## 2022-12-15 NOTE — Telephone Encounter (Signed)
Notified Patient of completion of FMLA forms. Copy of forms placed for pick-up as requested by Patient. No other needs or concerns voiced at this time.

## 2022-12-16 ENCOUNTER — Ambulatory Visit
Admission: RE | Admit: 2022-12-16 | Discharge: 2022-12-16 | Disposition: A | Discharge status: Home / Self Care | Payer: BC Managed Care – PPO | Source: Ambulatory Visit | Attending: Radiation Oncology | Admitting: Radiation Oncology

## 2022-12-16 ENCOUNTER — Other Ambulatory Visit: Payer: Self-pay

## 2022-12-16 DIAGNOSIS — C3412 Malignant neoplasm of upper lobe, left bronchus or lung: Secondary | ICD-10-CM | POA: Diagnosis not present

## 2022-12-16 LAB — RAD ONC ARIA SESSION SUMMARY
Course Elapsed Days: 11
Plan Fractions Treated to Date: 10
Plan Prescribed Dose Per Fraction: 2 Gy
Plan Total Fractions Prescribed: 30
Plan Total Prescribed Dose: 60 Gy
Reference Point Dosage Given to Date: 20 Gy
Reference Point Session Dosage Given: 2 Gy
Session Number: 9

## 2022-12-16 MED FILL — Dexamethasone Sodium Phosphate Inj 100 MG/10ML: INTRAMUSCULAR | Qty: 1 | Status: AC

## 2022-12-19 ENCOUNTER — Encounter: Payer: Self-pay | Admitting: Physician Assistant

## 2022-12-19 ENCOUNTER — Inpatient Hospital Stay: Payer: BC Managed Care – PPO

## 2022-12-19 ENCOUNTER — Ambulatory Visit
Admission: RE | Admit: 2022-12-19 | Discharge: 2022-12-19 | Disposition: A | Discharge status: Home / Self Care | Payer: BC Managed Care – PPO | Source: Ambulatory Visit | Attending: Radiation Oncology | Admitting: Radiation Oncology

## 2022-12-19 ENCOUNTER — Other Ambulatory Visit: Payer: Self-pay

## 2022-12-19 ENCOUNTER — Inpatient Hospital Stay (HOSPITAL_BASED_OUTPATIENT_CLINIC_OR_DEPARTMENT_OTHER): Payer: BC Managed Care – PPO | Admitting: Internal Medicine

## 2022-12-19 VITALS — BP 103/72 | HR 119 | Temp 98.3°F | Resp 16 | Wt 83.8 lb

## 2022-12-19 VITALS — BP 102/68 | HR 110 | Temp 98.3°F | Resp 16

## 2022-12-19 DIAGNOSIS — E860 Dehydration: Secondary | ICD-10-CM | POA: Diagnosis not present

## 2022-12-19 DIAGNOSIS — C3412 Malignant neoplasm of upper lobe, left bronchus or lung: Secondary | ICD-10-CM | POA: Diagnosis not present

## 2022-12-19 DIAGNOSIS — C3492 Malignant neoplasm of unspecified part of left bronchus or lung: Secondary | ICD-10-CM | POA: Diagnosis not present

## 2022-12-19 LAB — CBC WITH DIFFERENTIAL (CANCER CENTER ONLY)
Abs Immature Granulocytes: 0.02 10*3/uL (ref 0.00–0.07)
Basophils Absolute: 0 10*3/uL (ref 0.0–0.1)
Basophils Relative: 1 %
Eosinophils Absolute: 0 10*3/uL (ref 0.0–0.5)
Eosinophils Relative: 1 %
HCT: 34.5 % — ABNORMAL LOW (ref 36.0–46.0)
Hemoglobin: 10.7 g/dL — ABNORMAL LOW (ref 12.0–15.0)
Immature Granulocytes: 0 %
Lymphocytes Relative: 16 %
Lymphs Abs: 0.8 10*3/uL (ref 0.7–4.0)
MCH: 24.9 pg — ABNORMAL LOW (ref 26.0–34.0)
MCHC: 31 g/dL (ref 30.0–36.0)
MCV: 80.2 fL (ref 80.0–100.0)
Monocytes Absolute: 0.8 10*3/uL (ref 0.1–1.0)
Monocytes Relative: 14 %
Neutro Abs: 3.6 10*3/uL (ref 1.7–7.7)
Neutrophils Relative %: 68 %
Platelet Count: 390 10*3/uL (ref 150–400)
RBC: 4.3 MIL/uL (ref 3.87–5.11)
RDW: 19.6 % — ABNORMAL HIGH (ref 11.5–15.5)
WBC Count: 5.3 10*3/uL (ref 4.0–10.5)
nRBC: 0 % (ref 0.0–0.2)

## 2022-12-19 LAB — RAD ONC ARIA SESSION SUMMARY
Course Elapsed Days: 14
Plan Fractions Treated to Date: 11
Plan Prescribed Dose Per Fraction: 2 Gy
Plan Total Fractions Prescribed: 30
Plan Total Prescribed Dose: 60 Gy
Reference Point Dosage Given to Date: 22 Gy
Reference Point Session Dosage Given: 2 Gy
Session Number: 10

## 2022-12-19 LAB — CMP (CANCER CENTER ONLY)
ALT: 34 U/L (ref 0–44)
AST: 30 U/L (ref 15–41)
Albumin: 3 g/dL — ABNORMAL LOW (ref 3.5–5.0)
Alkaline Phosphatase: 210 U/L — ABNORMAL HIGH (ref 38–126)
Anion gap: 6 (ref 5–15)
BUN: 8 mg/dL (ref 6–20)
CO2: 28 mmol/L (ref 22–32)
Calcium: 9.2 mg/dL (ref 8.9–10.3)
Chloride: 98 mmol/L (ref 98–111)
Creatinine: 0.61 mg/dL (ref 0.44–1.00)
GFR, Estimated: >60 mL/min (ref >60)
Glucose, Bld: 87 mg/dL (ref 70–99)
Potassium: 4.7 mmol/L (ref 3.5–5.1)
Sodium: 132 mmol/L — ABNORMAL LOW (ref 135–145)
Total Bilirubin: 0.4 mg/dL (ref 0.3–1.2)
Total Protein: 7 g/dL (ref 6.5–8.1)

## 2022-12-19 MED ORDER — SODIUM CHLORIDE 0.9 % IV SOLN: INTRAVENOUS | Status: AC

## 2022-12-19 MED ORDER — FAMOTIDINE IN NACL 20-0.9 MG/50ML-% IV SOLN
20.0000 mg | Freq: Once | INTRAVENOUS | Status: AC
  Administered 2022-12-19: 20 mg via INTRAVENOUS
  Filled 2022-12-19: qty 50

## 2022-12-19 MED ORDER — SODIUM CHLORIDE 0.9 % IV SOLN
157.4000 mg | Freq: Once | INTRAVENOUS | Status: AC
  Administered 2022-12-19: 150 mg via INTRAVENOUS
  Filled 2022-12-19: qty 15

## 2022-12-19 MED ORDER — PALONOSETRON HCL INJECTION 0.25 MG/5ML
0.2500 mg | Freq: Once | INTRAVENOUS | Status: AC
  Administered 2022-12-19: 0.25 mg via INTRAVENOUS
  Filled 2022-12-19: qty 5

## 2022-12-19 MED ORDER — SODIUM CHLORIDE 0.9 % IV SOLN
45.0000 mg/m2 | Freq: Once | INTRAVENOUS | Status: AC
  Administered 2022-12-19: 60 mg via INTRAVENOUS
  Filled 2022-12-19: qty 10

## 2022-12-19 MED ORDER — SODIUM CHLORIDE 0.9 % IV SOLN
10.0000 mg | Freq: Once | INTRAVENOUS | Status: AC
  Administered 2022-12-19: 10 mg via INTRAVENOUS
  Filled 2022-12-19: qty 10

## 2022-12-19 MED ORDER — OXYCODONE-ACETAMINOPHEN 5-325 MG PO TABS: 1.0000 | ORAL_TABLET | Freq: Three times a day (TID) | ORAL | 0 refills | Status: DC | PRN

## 2022-12-19 MED ORDER — DIPHENHYDRAMINE HCL 50 MG/ML IJ SOLN
50.0000 mg | Freq: Once | INTRAMUSCULAR | Status: AC
  Administered 2022-12-19: 50 mg via INTRAVENOUS
  Filled 2022-12-19: qty 1

## 2022-12-19 MED ORDER — SODIUM CHLORIDE 0.9 % IV SOLN: Freq: Once | INTRAVENOUS | Status: AC

## 2022-12-19 NOTE — Progress Notes (Signed)
Nutrition Follow-up:  Patient with non small cell lung cancer.  Patient receiving concurrent chemotherapy and radiation.  Met with patient during infusion.  Reports that foods feel like they get stuck in her throat and mid esophagus.  Says that she has reported this to MD and feels like it is the radiation treatments.  Patient is taking prilosec.  Reports that she is eating applesauce, baked fish, tuna, cooked vegetables and drinking ensure.     Medications: reviewed  Labs: reviewed  Anthropometrics:   Weight 83 lb 12.8 oz today  83 lb on 1/29 88 lb on 1/17 98 lb on 9/19   NUTRITION DIAGNOSIS: Inadequate oral intake continues    INTERVENTION:  Continue 350 calorie shake TID for added nutrition.  Discussed strategies to soften, moisten foods for ease of swallowing    MONITORING, EVALUATION, GOAL: weight trends, intake   NEXT VISIT: Monday, Feb 26 during infusion  Catlyn Shipton B. Zenia Resides, [place], [place] Registered Dietitian [phone]

## 2022-12-19 NOTE — Progress Notes (Signed)
[facility] [phone]   [phone]  OFFICE PROGRESS NOTE  Lujean Amel, MD [address] [address]  DIAGNOSIS: Stage IIb (T2b, N1, M0) non-small cell lung cancer with unknown subtype pending additional immunohistochemical stains of the lymph node biopsy diagnosed in January 2024 and presented with large left upper lobe lung mass in addition to left hilar adenopathy.    Detected Alteration(s) / Biomarker(s) Associated FDA-approved therapies Clinical Trial Availability % cfDNA or Amplification TP53 [id number] None Yes 0.4%   PRIOR THERAPY: None   CURRENT THERAPY: Concurrent chemoradiation with weekly carboplatin for AUC of 2 and paclitaxel 45 Mg/M2.  First dose December 05, 2022.  Status post 2 cycles.  INTERVAL HISTORY: Elaine Gibson 47 y.o. female returns to the clinic today for follow-up visit.  The patient is feeling fine today with no concerning complaints except for the mild fatigue and odynophagia.  She was given prescription for Carafate but no improvement.  She is currently on Percocet for pain management.  She denied having any current chest pain, shortness of breath, cough or hemoptysis.  She received 2 units of PRBCs transfusion 2 weeks ago and she is feeling a little bit better.  She has no recent fever or chills but continues to have weight loss.  She is here today for evaluation before starting cycle #3 of her treatment.  MEDICAL HISTORY: Past Medical History:  Diagnosis Date   Alpha-1-antitrypsin deficiency carrier    Dyspnea    Emphysema lung ([facility])    Emphysema of lung ([facility])    Hypothyroidism    tumors    Scoliosis     ALLERGIES:  has No Known Allergies.  MEDICATIONS:  Current Outpatient Medications  Medication Sig Dispense Refill   oxyCODONE-acetaminophen (PERCOCET/ROXICET) 5-325 MG tablet Take 1 tablet by mouth every 8 (eight) hours as needed for severe pain. 30 tablet 0   albuterol  (VENTOLIN HFA) 108 (90 Base) MCG/ACT inhaler Inhale 2 puffs into the lungs daily. 1 each 0   FeFum-FePoly-FA-B Cmp-C-Biot (INTEGRA PLUS) CAPS Take 1 capsule by mouth daily. 30 capsule 3   Fluticasone-Umeclidin-Vilant (TRELEGY ELLIPTA) 100-62.5-25 MCG/ACT AEPB Inhale 1 puff into the lungs daily. 60 each 0   omeprazole (PRILOSEC) 20 MG capsule Take 1 capsule (20 mg total) by mouth daily. 30 capsule 1   prochlorperazine (COMPAZINE) 10 MG tablet Take 1 tablet (10 mg total) by mouth every 6 (six) hours as needed. 30 tablet 2   No current facility-administered medications for this visit.    SURGICAL HISTORY:  Past Surgical History:  Procedure Laterality Date   BACK SURGERY     BRONCHIAL BIOPSY  11/15/2022   Procedure: BRONCHIAL BIOPSIES;  Surgeon: Garner Nash, DO;  Location: [facility];  Service: Pulmonary;;   BRONCHIAL NEEDLE ASPIRATION BIOPSY  11/15/2022   Procedure: BRONCHIAL NEEDLE ASPIRATION BIOPSIES;  Surgeon: Garner Nash, DO;  Location: [facility];  Service: Pulmonary;;   CESAREAN SECTION     FINE NEEDLE ASPIRATION  11/15/2022   Procedure: FINE NEEDLE ASPIRATION (FNA) LINEAR;  Surgeon: Garner Nash, DO;  Location: [facility];  Service: Pulmonary;;   VIDEO BRONCHOSCOPY WITH ENDOBRONCHIAL ULTRASOUND Bilateral 11/15/2022   Procedure: VIDEO BRONCHOSCOPY WITH ENDOBRONCHIAL ULTRASOUND;  Surgeon: Garner Nash, DO;  Location: [facility];  Service: Pulmonary;  Laterality: Bilateral;    REVIEW OF SYSTEMS:  Constitutional: positive for fatigue Eyes: negative Ears, nose, mouth, throat, and face: negative Respiratory: negative Cardiovascular: negative  Gastrointestinal: positive for odynophagia Genitourinary:negative Integument/breast: negative Hematologic/lymphatic: negative Musculoskeletal:negative Neurological: negative Behavioral/Psych: negative Endocrine: negative Allergic/Immunologic: negative   PHYSICAL EXAMINATION: General appearance: alert, cooperative, fatigued,  and no distress Head: Normocephalic, without obvious abnormality, atraumatic Neck: no adenopathy, no JVD, supple, symmetrical, trachea midline, and thyroid not enlarged, symmetric, no tenderness/mass/nodules Lymph nodes: Cervical, supraclavicular, and axillary nodes normal. Resp: clear to auscultation bilaterally Back: symmetric, no curvature. ROM normal. No CVA tenderness. Cardio: regular rate and rhythm, S1, S2 normal, no murmur, click, rub or gallop GI: soft, non-tender; bowel sounds normal; no masses,  no organomegaly Extremities: extremities normal, atraumatic, no cyanosis or edema Neurologic: Alert and oriented X 3, normal strength and tone. Normal symmetric reflexes. Normal coordination and gait  ECOG PERFORMANCE STATUS: 1 - Symptomatic but completely ambulatory  Blood pressure 103/72, pulse (!) 119, temperature 98.3 F (36.8 C), temperature source Oral, resp. rate 16, weight 83 lb 12.8 oz (38 kg), SpO2 95 %.  LABORATORY DATA: Lab Results  Component Value Date   WBC 5.3 12/19/2022   HGB 10.7 (L) 12/19/2022   HCT 34.5 (L) 12/19/2022   MCV 80.2 12/19/2022   PLT 390 12/19/2022      Chemistry      Component Value Date/Time   NA 132 (L) 12/19/2022 0848   K 4.7 12/19/2022 0848   CL 98 12/19/2022 0848   CO2 28 12/19/2022 0848   BUN 8 12/19/2022 0848   CREATININE 0.61 12/19/2022 0848      Component Value Date/Time   CALCIUM 9.2 12/19/2022 0848   ALKPHOS 210 (H) 12/19/2022 0848   AST 30 12/19/2022 0848   ALT 34 12/19/2022 0848   BILITOT 0.4 12/19/2022 0848       RADIOGRAPHIC STUDIES: No results found.  ASSESSMENT AND PLAN: This is a very pleasant 47 years old African-American female with Stage IIb (T2b, N1, M0) non-small cell lung cancer with unknown subtype diagnosed in January 2024 and presented with large left upper lobe lung mass in addition to left hilar adenopathy.  Molecular studies by [email or web address] showed no actionable mutations The patient is here today to  start the first cycle of concurrent chemoradiation with weekly carboplatin for AUC of 2 and paclitaxel 45 Mg/M2.  She is status post 2 cycles.  The patient is tolerating this treatment well with no concerning adverse effect except for fatigue. I recommended for the patient to proceed with cycle #3 today as planned. For the anemia, she received 2 units of PRBCs transfusion and I started the patient on Integra +1 capsule p.o. daily. For the dyspepsia, she is currently on Prilosec. For the dehydration and tachycardia, I will arrange for the patient to receive 1 L of normal saline in the clinic today. For the weight loss, I will refer her to the dietitian at the cancer center for evaluation and management of her weight loss. The patient will come back for follow-up visit in 2 weeks for evaluation before starting cycle #5. For the pain management, I will give her a refill of Percocet. The patient was advised to call immediately if she has any other concerning symptoms in the interval. The patient voices understanding of current disease status and treatment options and is in agreement with the current care plan.  All questions were answered. The patient knows to call the clinic with any problems, questions or concerns. We can certainly see the patient much sooner if necessary.  The total time spent in the appointment was 30 minutes.  Disclaimer: This note was  dictated with voice recognition software. Similar sounding words can inadvertently be transcribed and may not be corrected upon review.

## 2022-12-19 NOTE — Progress Notes (Signed)
Per Dr. Julien Nordmann, ok for treatment today with elevated hear rate 117. Pt. denies chest pain, dizziness, no shortness of breath noted. Pt. to receive 1 liter IV fluids.

## 2022-12-19 NOTE — Progress Notes (Signed)
Patient called and left voicemail wanting to apply for [employer]. Advised patient what is needed to apply and she may provide to [facility] in [place] since she has appointments there all week. She verbalized understanding.  Staff message sent to [facility] to advise.  She has my contact information for any additional financial questions or concerns.

## 2022-12-19 NOTE — Patient Instructions (Addendum)
[facility]  Discharge Instructions: Thank you for choosing [facility] to provide your oncology and hematology care.   If you have a lab appointment with the [facility], please go directly to the [facility] and check in at the registration area.   Wear comfortable clothing and clothing appropriate for easy access to any Portacath or PICC line.   We strive to give you quality time with your provider. You may need to reschedule your appointment if you arrive late (15 or more minutes).  Arriving late affects you and other patients whose appointments are after yours.  Also, if you miss three or more appointments without notifying the office, you may be dismissed from the clinic at the provider's discretion.      For prescription refill requests, have your pharmacy contact our office and allow 72 hours for refills to be completed.    Today you received the following chemotherapy and/or immunotherapy agents: Paclitaxel and Carboplatin.   To help prevent nausea and vomiting after your treatment, we encourage you to take your nausea medication as directed.  BELOW ARE SYMPTOMS THAT SHOULD BE REPORTED IMMEDIATELY: *FEVER GREATER THAN 100.4 F (38 C) OR HIGHER *CHILLS OR SWEATING *NAUSEA AND VOMITING THAT IS NOT CONTROLLED WITH YOUR NAUSEA MEDICATION *UNUSUAL SHORTNESS OF BREATH *UNUSUAL BRUISING OR BLEEDING *URINARY PROBLEMS (pain or burning when urinating, or frequent urination) *BOWEL PROBLEMS (unusual diarrhea, constipation, pain near the anus) TENDERNESS IN MOUTH AND THROAT WITH OR WITHOUT PRESENCE OF ULCERS (sore throat, sores in mouth, or a toothache) UNUSUAL RASH, SWELLING OR PAIN  UNUSUAL VAGINAL DISCHARGE OR ITCHING   Items with * indicate a potential emergency and should be followed up as soon as possible or go to the Emergency Department if any problems should occur.  Please show the CHEMOTHERAPY ALERT CARD or IMMUNOTHERAPY  ALERT CARD at check-in to the Emergency Department and triage nurse.  Should you have questions after your visit or need to cancel or reschedule your appointment, please contact [facility]  Dept: [phone]  and follow the prompts.  Office hours are 8:00 a.m. to 4:30 p.m. Monday - Friday. Please note that voicemails left after 4:00 p.m. may not be returned until the following business day.  We are closed weekends and major holidays. You have access to a nurse at all times for urgent questions. Please call the main number to the clinic Dept: [phone] and follow the prompts.   For any non-urgent questions, you may also contact your provider using MyChart. We now offer e-Visits for anyone 53 and older to request care online for non-urgent symptoms. For details visit [email or web address].   Also download the MyChart app! Go to the app store, search "MyChart", open the app, select [facility], and log in with your MyChart username and password.  Hyponatremia Hyponatremia is when the amount of salt (sodium) in a person's blood is too low. When sodium levels are low, the cells absorb extra water, which causes them to swell. The swelling happens throughout the body, but it mostly affects the brain. What are the causes? This condition may be caused by: Certain medical conditions, such as: Heart, kidney, or liver problems. Thyroid problems. Adrenal gland problems. Metabolic conditions, such as Addison's disease or syndrome of inappropriate antidiuresis (SIAD). Excessive vomiting, diarrhea, or sweating. Certain medicines or illegal drugs. Fluids given through an IV. What increases the risk? You are more likely to develop this condition  if you: Have certain medical conditions such as heart, kidney, or liver failure. Have a medical condition that causes frequent or excessive diarrhea. Participate in intense physical activities, such as marathon  running. Take certain medicines that affect the sodium and fluid balance in the blood. Some of these medicine types include: Diuretics. NSAIDs, such as ibuprofen. Some opioid pain medicines. Some antidepressants. Some seizure prevention medicines. What are the signs or symptoms? Symptoms of this condition include: Headache. Nausea and vomiting. Being very tired (lethargic). Muscle weakness and cramping. Loss of appetite. Feeling weak or light-headed. Severe symptoms of this condition include: Confusion. Agitation. Having a rapid heart rate. Fainting. Seizures. Coma. How is this diagnosed? This condition is diagnosed based on: A physical exam. Your medical history. Tests, including: Blood tests. Urine tests. How is this treated? Treatment for this condition depends on the cause. Treatment may include: Getting fluids through an IV that is inserted into one of your veins. Medicines to correct the sodium imbalance. If medicines are causing the condition, the medicines will need to be adjusted. Limiting your water or fluid intake to get the correct sodium balance, in certain cases. Monitoring in the hospital to closely watch your symptoms for improvement. Follow these instructions at home:  Take over-the-counter and prescription medicines only as told by your health care provider. Many medicines can make this condition worse. Talk with your health care provider about any medicines that you are currently taking. Do not drink alcohol. Keep all follow-up visits. This is important. Contact a health care provider if: You develop worsening nausea, fatigue, headache, confusion, or weakness. Your symptoms go away and then return. Get help right away if: You have a seizure. You faint. You have ongoing diarrhea or vomiting. Summary Hyponatremia is when the amount of salt (sodium) in your blood is too low. When sodium levels are low, your cells absorb extra water, which causes them  to swell. The swelling happens throughout the body, but it mostly affects the brain. Treatment for this condition depends on the cause. It may include receiving IV fluids, taking or adjusting medicines, limiting fluid intake, and monitoring in the hospital. This information is not intended to replace advice given to you by your health care provider. Make sure you discuss any questions you have with your health care provider. Document Revised: 05/04/2021 Document Reviewed: 05/04/2021 Elsevier Patient Education  [place].  Dehydration, Adult Dehydration is condition in which there is not enough water or other fluids in the body. This happens when a person loses more fluids than he or she takes in. Important body parts cannot work right without the right amount of fluids. Any loss of fluids from the body can cause dehydration. Dehydration can be mild, worse, or very bad. It should be treated right away to keep it from getting very bad. What are the causes? This condition may be caused by: Conditions that cause loss of water or other fluids, such as: Watery poop (diarrhea). Vomiting. Sweating a lot. Peeing (urinating) a lot. Not drinking enough fluids, especially when you: Are ill. Are doing things that take a lot of energy to do. Other illnesses and conditions, such as fever or infection. Certain medicines, such as medicines that take extra fluid out of the body (diuretics). Lack of safe drinking water. Not being able to get enough water and food. What increases the risk? The following factors may make you more likely to develop this condition: Having a long-term (chronic) illness that has not been treated  the right way, such as: Diabetes. Heart disease. Kidney disease. Being 4 years of age or older. Having a disability. Living in a place that is high above the ground or sea (high in altitude). The thinner, dried air causes more fluid loss. Doing exercises that put stress on  your body for a long time. What are the signs or symptoms? Symptoms of dehydration depend on how bad it is. Mild or worse dehydration Thirst. Dry lips or dry mouth. Feeling dizzy or light-headed, especially when you stand up from sitting. Muscle cramps. Your body making: Dark pee (urine). Pee may be the color of tea. Less pee than normal. Less tears than normal. Headache. Very bad dehydration Changes in skin. Skin may: Be cold to the touch (clammy). Be blotchy or pale. Not go back to normal right after you lightly pinch it and let it go. Little or no tears, pee, or sweat. Changes in vital signs, such as: Fast breathing. Low blood pressure. Weak pulse. Pulse that is more than 100 beats a minute when you are sitting still. Other changes, such as: Feeling very thirsty. Eyes that look hollow (sunken). Cold hands and feet. Being mixed up (confused). Being very tired (lethargic) or having trouble waking from sleep. Short-term weight loss. Loss of consciousness. How is this treated? Treatment for this condition depends on how bad it is. Treatment should start right away. Do not wait until your condition gets very bad. Very bad dehydration is an emergency. You will need to go to a hospital. Mild or worse dehydration can be treated at home. You may be asked to: Drink more fluids. Drink an oral rehydration solution (ORS). This drink helps get the right amounts of fluids and salts and minerals in the blood (electrolytes). Very bad dehydration can be treated: With fluids through an IV tube. By getting normal levels of salts and minerals in your blood. This is often done by giving salts and minerals through a tube. The tube is passed through your nose and into your stomach. By treating the root cause. Follow these instructions at home: Oral rehydration solution If told by your doctor, drink an ORS: Make an ORS. Use instructions on the package. Start by drinking small amounts, about   cup (120 mL) every 5-10 minutes. Slowly drink more until you have had the amount that your doctor said to have. Eating and drinking        Drink enough clear fluid to keep your pee pale yellow. If you were told to drink an ORS, finish the ORS first. Then, start slowly drinking other clear fluids. Drink fluids such as: Water. Do not drink only water. Doing that can make the salt (sodium) level in your body get too low. Water from ice chips you suck on. Fruit juice that you have added water to (diluted). Low-calorie sports drinks. Eat foods that have the right amounts of salts and minerals, such as: Bananas. Oranges. Potatoes. Tomatoes. Spinach. Do not drink alcohol. Avoid: Drinks that have a lot of sugar. These include: High-calorie sports drinks. Fruit juice that you did not add water to. Soda. Caffeine. Foods that are greasy or have a lot of fat or sugar. General instructions Take over-the-counter and prescription medicines only as told by your doctor. Do not take salt tablets. Doing that can make the salt level in your body get too high. Return to your normal activities as told by your doctor. Ask your doctor what activities are safe for you. Keep all follow-up visits as  told by your doctor. This is important. Contact a doctor if: You have pain in your belly (abdomen) and the pain: Gets worse. Stays in one place. You have a rash. You have a stiff neck. You get angry or annoyed (irritable) more easily than normal. You are more tired or have a harder time waking than normal. You feel: Weak or dizzy. Very thirsty. Get help right away if you have: Any symptoms of very bad dehydration. Symptoms of vomiting, such as: You cannot eat or drink without vomiting. Your vomiting gets worse or does not go away. Your vomit has blood or green stuff in it. Symptoms that get worse with treatment. A fever. A very bad headache. Problems with peeing or pooping (having a bowel  movement), such as: Watery poop that gets worse or does not go away. Blood in your poop (stool). This may cause poop to look black and tarry. Not peeing in 6-8 hours. Peeing only a small amount of very dark pee in 6-8 hours. Trouble breathing. These symptoms may be an emergency. Do not wait to see if the symptoms will go away. Get medical help right away. Call your local emergency services (911 in the [place] Do not drive yourself to the hospital. Summary Dehydration is a condition in which there is not enough water or other fluids in the body. This happens when a person loses more fluids than he or she takes in. Treatment for this condition depends on how bad it is. Treatment should be started right away. Do not wait until your condition gets very bad. Drink enough clear fluid to keep your pee pale yellow. If you were told to drink an oral rehydration solution (ORS), finish the ORS first. Then, start slowly drinking other clear fluids. Take over-the-counter and prescription medicines only as told by your doctor. Get help right away if you have any symptoms of very bad dehydration. This information is not intended to replace advice given to you by your health care provider. Make sure you discuss any questions you have with your health care provider. Document Revised: 03/02/2022 Document Reviewed: 06/06/2019 Elsevier Patient Education  [place].

## 2022-12-20 ENCOUNTER — Ambulatory Visit
Admission: RE | Admit: 2022-12-20 | Discharge: 2022-12-20 | Disposition: A | Discharge status: Home / Self Care | Payer: BC Managed Care – PPO | Source: Ambulatory Visit | Attending: Radiation Oncology | Admitting: Radiation Oncology

## 2022-12-20 ENCOUNTER — Other Ambulatory Visit: Payer: Self-pay

## 2022-12-20 DIAGNOSIS — C3412 Malignant neoplasm of upper lobe, left bronchus or lung: Secondary | ICD-10-CM | POA: Diagnosis not present

## 2022-12-20 LAB — RAD ONC ARIA SESSION SUMMARY
Course Elapsed Days: 15
Plan Fractions Treated to Date: 12
Plan Prescribed Dose Per Fraction: 2 Gy
Plan Total Fractions Prescribed: 30
Plan Total Prescribed Dose: 60 Gy
Reference Point Dosage Given to Date: 24 Gy
Reference Point Session Dosage Given: 2 Gy
Session Number: 11

## 2022-12-21 ENCOUNTER — Ambulatory Visit
Admission: RE | Admit: 2022-12-21 | Discharge: 2022-12-21 | Disposition: A | Discharge status: Home / Self Care | Payer: BC Managed Care – PPO | Source: Ambulatory Visit | Attending: Radiation Oncology | Admitting: Radiation Oncology

## 2022-12-21 ENCOUNTER — Other Ambulatory Visit: Payer: Self-pay

## 2022-12-21 DIAGNOSIS — C3412 Malignant neoplasm of upper lobe, left bronchus or lung: Secondary | ICD-10-CM | POA: Diagnosis not present

## 2022-12-21 LAB — RAD ONC ARIA SESSION SUMMARY
Course Elapsed Days: 16
Plan Fractions Treated to Date: 13
Plan Prescribed Dose Per Fraction: 2 Gy
Plan Total Fractions Prescribed: 30
Plan Total Prescribed Dose: 60 Gy
Reference Point Dosage Given to Date: 26 Gy
Reference Point Session Dosage Given: 2 Gy
Session Number: 12

## 2022-12-22 ENCOUNTER — Other Ambulatory Visit: Payer: Self-pay

## 2022-12-22 ENCOUNTER — Ambulatory Visit
Admission: RE | Admit: 2022-12-22 | Discharge: 2022-12-22 | Disposition: A | Discharge status: Home / Self Care | Payer: BC Managed Care – PPO | Source: Ambulatory Visit | Attending: Radiation Oncology | Admitting: Radiation Oncology

## 2022-12-22 DIAGNOSIS — C3412 Malignant neoplasm of upper lobe, left bronchus or lung: Secondary | ICD-10-CM | POA: Diagnosis not present

## 2022-12-22 LAB — RAD ONC ARIA SESSION SUMMARY
Course Elapsed Days: 17
Plan Fractions Treated to Date: 14
Plan Prescribed Dose Per Fraction: 2 Gy
Plan Total Fractions Prescribed: 30
Plan Total Prescribed Dose: 60 Gy
Reference Point Dosage Given to Date: 28 Gy
Reference Point Session Dosage Given: 2 Gy
Session Number: 13

## 2022-12-23 ENCOUNTER — Ambulatory Visit
Admission: RE | Admit: 2022-12-23 | Discharge: 2022-12-23 | Disposition: A | Discharge status: Home / Self Care | Payer: BC Managed Care – PPO | Source: Ambulatory Visit | Attending: Radiation Oncology | Admitting: Radiation Oncology

## 2022-12-23 ENCOUNTER — Other Ambulatory Visit: Payer: Self-pay | Admitting: Radiation Oncology

## 2022-12-23 ENCOUNTER — Other Ambulatory Visit: Payer: Self-pay

## 2022-12-23 DIAGNOSIS — C3412 Malignant neoplasm of upper lobe, left bronchus or lung: Secondary | ICD-10-CM | POA: Diagnosis not present

## 2022-12-23 LAB — RAD ONC ARIA SESSION SUMMARY
Course Elapsed Days: 18
Plan Fractions Treated to Date: 15
Plan Prescribed Dose Per Fraction: 2 Gy
Plan Total Fractions Prescribed: 30
Plan Total Prescribed Dose: 60 Gy
Reference Point Dosage Given to Date: 30 Gy
Reference Point Session Dosage Given: 2 Gy
Session Number: 14

## 2022-12-23 MED ORDER — SUCRALFATE 1 G PO TABS: 1.0000 g | ORAL_TABLET | Freq: Four times a day (QID) | ORAL | 2 refills | Status: DC

## 2022-12-23 MED FILL — Dexamethasone Sodium Phosphate Inj 100 MG/10ML: INTRAMUSCULAR | Qty: 1 | Status: AC

## 2022-12-26 ENCOUNTER — Ambulatory Visit
Admission: RE | Admit: 2022-12-26 | Discharge: 2022-12-26 | Disposition: A | Discharge status: Home / Self Care | Payer: BC Managed Care – PPO | Source: Ambulatory Visit | Attending: Radiation Oncology | Admitting: Radiation Oncology

## 2022-12-26 ENCOUNTER — Inpatient Hospital Stay: Payer: BC Managed Care – PPO

## 2022-12-26 ENCOUNTER — Other Ambulatory Visit: Payer: Self-pay | Admitting: Physician Assistant

## 2022-12-26 ENCOUNTER — Other Ambulatory Visit: Payer: Self-pay

## 2022-12-26 ENCOUNTER — Inpatient Hospital Stay: Payer: BC Managed Care – PPO | Admitting: Licensed Clinical Social Worker

## 2022-12-26 VITALS — BP 113/84 | HR 95 | Temp 98.2°F | Resp 18 | Wt 88.1 lb

## 2022-12-26 DIAGNOSIS — C3492 Malignant neoplasm of unspecified part of left bronchus or lung: Secondary | ICD-10-CM

## 2022-12-26 DIAGNOSIS — C3412 Malignant neoplasm of upper lobe, left bronchus or lung: Secondary | ICD-10-CM | POA: Diagnosis not present

## 2022-12-26 LAB — CBC WITH DIFFERENTIAL (CANCER CENTER ONLY)
Abs Immature Granulocytes: 0.02 10*3/uL (ref 0.00–0.07)
Basophils Absolute: 0 10*3/uL (ref 0.0–0.1)
Basophils Relative: 1 %
Eosinophils Absolute: 0 10*3/uL (ref 0.0–0.5)
Eosinophils Relative: 1 %
HCT: 31.3 % — ABNORMAL LOW (ref 36.0–46.0)
Hemoglobin: 9.6 g/dL — ABNORMAL LOW (ref 12.0–15.0)
Immature Granulocytes: 1 %
Lymphocytes Relative: 18 %
Lymphs Abs: 0.6 10*3/uL — ABNORMAL LOW (ref 0.7–4.0)
MCH: 25.1 pg — ABNORMAL LOW (ref 26.0–34.0)
MCHC: 30.7 g/dL (ref 30.0–36.0)
MCV: 81.9 fL (ref 80.0–100.0)
Monocytes Absolute: 0.4 10*3/uL (ref 0.1–1.0)
Monocytes Relative: 11 %
Neutro Abs: 2.5 10*3/uL (ref 1.7–7.7)
Neutrophils Relative %: 68 %
Platelet Count: 373 10*3/uL (ref 150–400)
RBC: 3.82 MIL/uL — ABNORMAL LOW (ref 3.87–5.11)
RDW: 19 % — ABNORMAL HIGH (ref 11.5–15.5)
WBC Count: 3.6 10*3/uL — ABNORMAL LOW (ref 4.0–10.5)
nRBC: 0 % (ref 0.0–0.2)

## 2022-12-26 LAB — CMP (CANCER CENTER ONLY)
ALT: 41 U/L (ref 0–44)
AST: 32 U/L (ref 15–41)
Albumin: 3 g/dL — ABNORMAL LOW (ref 3.5–5.0)
Alkaline Phosphatase: 163 U/L — ABNORMAL HIGH (ref 38–126)
Anion gap: 7 (ref 5–15)
BUN: 9 mg/dL (ref 6–20)
CO2: 29 mmol/L (ref 22–32)
Calcium: 8.9 mg/dL (ref 8.9–10.3)
Chloride: 99 mmol/L (ref 98–111)
Creatinine: 0.62 mg/dL (ref 0.44–1.00)
GFR, Estimated: >60 mL/min (ref >60)
Glucose, Bld: 110 mg/dL — ABNORMAL HIGH (ref 70–99)
Potassium: 4.2 mmol/L (ref 3.5–5.1)
Sodium: 135 mmol/L (ref 135–145)
Total Bilirubin: 0.2 mg/dL — ABNORMAL LOW (ref 0.3–1.2)
Total Protein: 6.6 g/dL (ref 6.5–8.1)

## 2022-12-26 LAB — RAD ONC ARIA SESSION SUMMARY
Course Elapsed Days: 21
Plan Fractions Treated to Date: 16
Plan Prescribed Dose Per Fraction: 2 Gy
Plan Total Fractions Prescribed: 30
Plan Total Prescribed Dose: 60 Gy
Reference Point Dosage Given to Date: 32 Gy
Reference Point Session Dosage Given: 2 Gy
Session Number: 15

## 2022-12-26 MED ORDER — PALONOSETRON HCL INJECTION 0.25 MG/5ML
0.2500 mg | Freq: Once | INTRAVENOUS | Status: AC
  Administered 2022-12-26: 0.25 mg via INTRAVENOUS
  Filled 2022-12-26: qty 5

## 2022-12-26 MED ORDER — DIPHENHYDRAMINE HCL 50 MG/ML IJ SOLN
50.0000 mg | Freq: Once | INTRAMUSCULAR | Status: AC | PRN
  Administered 2022-12-26: 25 mg via INTRAVENOUS

## 2022-12-26 MED ORDER — SODIUM CHLORIDE 0.9 % IV SOLN
10.0000 mg | Freq: Once | INTRAVENOUS | Status: AC
  Administered 2022-12-26: 10 mg via INTRAVENOUS
  Filled 2022-12-26: qty 10

## 2022-12-26 MED ORDER — FAMOTIDINE IN NACL 20-0.9 MG/50ML-% IV SOLN
20.0000 mg | Freq: Once | INTRAVENOUS | Status: AC
  Administered 2022-12-26: 20 mg via INTRAVENOUS
  Filled 2022-12-26: qty 50

## 2022-12-26 MED ORDER — SODIUM CHLORIDE 0.9 % IV SOLN
157.4000 mg | Freq: Once | INTRAVENOUS | Status: AC
  Administered 2022-12-26: 150 mg via INTRAVENOUS
  Filled 2022-12-26: qty 15

## 2022-12-26 MED ORDER — HEPARIN SOD (PORK) LOCK FLUSH 100 UNIT/ML IV SOLN: 500.0000 [IU] | Freq: Once | INTRAVENOUS | Status: DC | PRN

## 2022-12-26 MED ORDER — SODIUM CHLORIDE 0.9 % IV SOLN
45.0000 mg/m2 | Freq: Once | INTRAVENOUS | Status: AC
  Administered 2022-12-26: 60 mg via INTRAVENOUS
  Filled 2022-12-26: qty 10

## 2022-12-26 MED ORDER — SODIUM CHLORIDE 0.9% FLUSH: 10.0000 mL | INTRAVENOUS | Status: DC | PRN

## 2022-12-26 MED ORDER — DIPHENHYDRAMINE HCL 50 MG/ML IJ SOLN
50.0000 mg | Freq: Once | INTRAMUSCULAR | Status: AC
  Administered 2022-12-26: 25 mg via INTRAVENOUS
  Filled 2022-12-26: qty 1

## 2022-12-26 MED ORDER — SODIUM CHLORIDE 0.9 % IV SOLN: Freq: Once | INTRAVENOUS | Status: AC

## 2022-12-26 MED ORDER — SODIUM CHLORIDE 0.9 % IV SOLN: Freq: Once | INTRAVENOUS | Status: DC | PRN

## 2022-12-26 MED ORDER — FAMOTIDINE IN NACL 20-0.9 MG/50ML-% IV SOLN
20.0000 mg | Freq: Once | INTRAVENOUS | Status: AC | PRN
  Administered 2022-12-26: 20 mg via INTRAVENOUS

## 2022-12-26 NOTE — Progress Notes (Signed)
The patient had a reaction to taxol today with increased shortness of breath and back pain. She received 25 mg of benadryl and 20 of pepcid in addition to her premedications with decadron 10 mg, pepcid 20 mg, aloxi, and 25 mg of benadryl. She also received IVF. Her infusion was stopped. Vitals remained WNL. Her infusion was resumed at a lower rate. Discussed her premedications moving forward with pharmacy who recommended changing decadron to 80 mg solumedrol. Adjusted care plan. Reviewed with Dr. Julien Nordmann who was in agreement. If further reactions today, would recommend stopping the infusion and administering solumedrol and then changing taxol in care plan to abraxane.

## 2022-12-26 NOTE — Progress Notes (Signed)
Hypersensitivity Reaction note  Date of event: 12/26/22 Time of event: 1459 Generic name of drug involved: Paclitaxel (Taxol) Name of provider notified of the hypersensitivity reaction: Cassandra Heilingoetter, PA-C and Curt Bears, MD Was agent that likely caused hypersensitivity reaction added to Allergies List within EMR? Yes Chain of events including reaction signs/symptoms, treatment administered, and outcome (e.g., drug resumed; drug discontinued; sent to Emergency Department; etc.)  0630: Patient complained of acute shortness of breath, sharp lower back pain, chest tightness, and flushing. Taxol stopped and 1L NS started. Cassandra Heilingoetter, PA-C called and came to chairside to assess. 1500: Pepcid 20 mg given and vitals taken.  1505: IV benadryl 25 mg given.  1514: Received verbal orders from Dr. Julien Nordmann to restart patient at half rate after 10-15 minutes.  1535: Patient restarted on Taxol at half rate.  1607: Patient restarted at original rate.  Patient able to tolerate the remainder of Taxol dose with no additional interventions. See MAR and flowsheets for vitals and medical administration times.    Severiano Gilbert, RN 12/26/2022 4:10 PM

## 2022-12-26 NOTE — Patient Instructions (Signed)
[facility]   Discharge Instructions: Thank you for choosing [facility] to provide your oncology and hematology care.   If you have a lab appointment with the [facility], please go directly to the [facility] and check in at the registration area.   Wear comfortable clothing and clothing appropriate for easy access to any Portacath or PICC line.   We strive to give you quality time with your provider. You may need to reschedule your appointment if you arrive late (15 or more minutes).  Arriving late affects you and other patients whose appointments are after yours.  Also, if you miss three or more appointments without notifying the office, you may be dismissed from the clinic at the provider's discretion.      For prescription refill requests, have your pharmacy contact our office and allow 72 hours for refills to be completed.    Today you received the following chemotherapy and/or immunotherapy agents: Paclitaxel (Taxol) and Carboplatin       To help prevent nausea and vomiting after your treatment, we encourage you to take your nausea medication as directed.  BELOW ARE SYMPTOMS THAT SHOULD BE REPORTED IMMEDIATELY: *FEVER GREATER THAN 100.4 F (38 C) OR HIGHER *CHILLS OR SWEATING *NAUSEA AND VOMITING THAT IS NOT CONTROLLED WITH YOUR NAUSEA MEDICATION *UNUSUAL SHORTNESS OF BREATH *UNUSUAL BRUISING OR BLEEDING *URINARY PROBLEMS (pain or burning when urinating, or frequent urination) *BOWEL PROBLEMS (unusual diarrhea, constipation, pain near the anus) TENDERNESS IN MOUTH AND THROAT WITH OR WITHOUT PRESENCE OF ULCERS (sore throat, sores in mouth, or a toothache) UNUSUAL RASH, SWELLING OR PAIN  UNUSUAL VAGINAL DISCHARGE OR ITCHING   Items with * indicate a potential emergency and should be followed up as soon as possible or go to the Emergency Department if any problems should occur.  Please show the CHEMOTHERAPY ALERT CARD or  IMMUNOTHERAPY ALERT CARD at check-in to the Emergency Department and triage nurse.  Should you have questions after your visit or need to cancel or reschedule your appointment, please contact [facility]  Dept: [phone]  and follow the prompts.  Office hours are 8:00 a.m. to 4:30 p.m. Monday - Friday. Please note that voicemails left after 4:00 p.m. may not be returned until the following business day.  We are closed weekends and major holidays. You have access to a nurse at all times for urgent questions. Please call the main number to the clinic Dept: [phone] and follow the prompts.   For any non-urgent questions, you may also contact your provider using MyChart. We now offer e-Visits for anyone 60 and older to request care online for non-urgent symptoms. For details visit [email or web address].   Also download the MyChart app! Go to the app store, search "MyChart", open the app, select [facility], and log in with your MyChart username and password.

## 2022-12-26 NOTE — Progress Notes (Signed)
[facility] CSW Progress Note  Holiday representative met with patient to provide w/ 2nd [employer] card.  Pt states she is tolerating treatment relatively well, but is experiencing nausea for a few days.  Pt reports she turned in her pay check stubs, but has not yet heard from Regulatory affairs officer.  Message sent to resource counselor to contact pt regarding the above.  CSW to see pt at next infusion.      Henriette Combs, LCSW

## 2022-12-26 NOTE — Progress Notes (Signed)
Per Cassandra Heilingoetter, PA-C - okay to decrease IV benadryl dose per patient request. Patient received 25 mg IV benadryl instead of 50 mg.

## 2022-12-27 ENCOUNTER — Ambulatory Visit
Admission: RE | Admit: 2022-12-27 | Discharge: 2022-12-27 | Disposition: A | Discharge status: Home / Self Care | Payer: BC Managed Care – PPO | Source: Ambulatory Visit | Attending: Radiation Oncology | Admitting: Radiation Oncology

## 2022-12-27 ENCOUNTER — Other Ambulatory Visit: Payer: Self-pay

## 2022-12-27 DIAGNOSIS — C3412 Malignant neoplasm of upper lobe, left bronchus or lung: Secondary | ICD-10-CM | POA: Diagnosis not present

## 2022-12-27 LAB — RAD ONC ARIA SESSION SUMMARY
Course Elapsed Days: 22
Plan Fractions Treated to Date: 17
Plan Prescribed Dose Per Fraction: 2 Gy
Plan Total Fractions Prescribed: 30
Plan Total Prescribed Dose: 60 Gy
Reference Point Dosage Given to Date: 34 Gy
Reference Point Session Dosage Given: 2 Gy
Session Number: 16

## 2022-12-28 ENCOUNTER — Ambulatory Visit
Admission: RE | Admit: 2022-12-28 | Discharge: 2022-12-28 | Disposition: A | Discharge status: Home / Self Care | Payer: BC Managed Care – PPO | Source: Ambulatory Visit | Attending: Radiation Oncology | Admitting: Radiation Oncology

## 2022-12-28 ENCOUNTER — Other Ambulatory Visit: Payer: Self-pay

## 2022-12-28 DIAGNOSIS — C3412 Malignant neoplasm of upper lobe, left bronchus or lung: Secondary | ICD-10-CM | POA: Diagnosis not present

## 2022-12-28 LAB — RAD ONC ARIA SESSION SUMMARY
Course Elapsed Days: 23
Plan Fractions Treated to Date: 18
Plan Prescribed Dose Per Fraction: 2 Gy
Plan Total Fractions Prescribed: 30
Plan Total Prescribed Dose: 60 Gy
Reference Point Dosage Given to Date: 36 Gy
Reference Point Session Dosage Given: 2 Gy
Session Number: 17

## 2022-12-29 ENCOUNTER — Other Ambulatory Visit: Payer: Self-pay | Admitting: Physician Assistant

## 2022-12-29 ENCOUNTER — Other Ambulatory Visit: Payer: Self-pay

## 2022-12-29 ENCOUNTER — Ambulatory Visit
Admission: RE | Admit: 2022-12-29 | Discharge: 2022-12-29 | Disposition: A | Discharge status: Home / Self Care | Payer: BC Managed Care – PPO | Source: Ambulatory Visit | Attending: Radiation Oncology | Admitting: Radiation Oncology

## 2022-12-29 DIAGNOSIS — C3412 Malignant neoplasm of upper lobe, left bronchus or lung: Secondary | ICD-10-CM | POA: Diagnosis not present

## 2022-12-29 DIAGNOSIS — D6481 Anemia due to antineoplastic chemotherapy: Secondary | ICD-10-CM

## 2022-12-29 LAB — RAD ONC ARIA SESSION SUMMARY
Course Elapsed Days: 24
Plan Fractions Treated to Date: 19
Plan Prescribed Dose Per Fraction: 2 Gy
Plan Total Fractions Prescribed: 30
Plan Total Prescribed Dose: 60 Gy
Reference Point Dosage Given to Date: 38 Gy
Reference Point Session Dosage Given: 2 Gy
Session Number: 18

## 2022-12-29 NOTE — Progress Notes (Signed)
[facility] OFFICE PROGRESS NOTE  Koirala, Dibas, MD [address] [place] [address]  DIAGNOSIS: Stage IIb (T2b, N1, M0) non-small cell lung cancer with unknown subtype pending additional immunohistochemical stains of the lymph node biopsy diagnosed in January 2024 and presented with large left upper lobe lung mass in addition to left hilar adenopathy.    Detected Alteration(s) / Biomarker(s) Associated FDA-approved therapies  Clinical Trial Availability      % cfDNA or Amplification TP53 R213L None Yes           0.4%  PRIOR THERAPY: None  CURRENT THERAPY: Concurrent chemoradiation with weekly carboplatin for AUC of 2 and paclitaxel 45 Mg/M2.  First dose December 05, 2022.  Status post 4 cycles.   INTERVAL HISTORY: Elaine Gibson 47 y.o. female returns to the clinic today for a follow-up visit.  The patient is currently undergoing a course of concurrent chemoradiation her last day radiation is tentatively scheduled for 01/18/23. Regarding her chemotherapy, the patient had a reaction to Taxol last week; however, she requested dose reduce of benadryl. For management of the reaction, she then received additional Benadryl and Pepcid.  She experienced back pain and shortness of breath.  Her symptoms had improved and she was able to complete the rest of her infusion. Moving forward, we will adjust her premedications to include Solu-Medrol instead of Decadron.  If she has further intolerance to this, then we would have to discontinue her Taxol and use Abraxane instead. We will also make her benadryl dose 50 mg. The patient denies any fever, chills, or night sweats.  She has been following closely with a member the nutritionist team for her weight loss.  She actually gained weight since last being seen.  She is scheduled to see nutrition today.  She was trying to drink Ensure previously but does not like the taste.  She is having some taste alterations without any  evidence of thrush.  In particular, the meats do not taste good.  She reports that her breathing is good.  She reports when she was first diagnosed she cannot lay on her left side due to shortness of breath and now she is able to lay on her left side. She denies any shortness of breath, cough, or hemoptysis.  Denies any nausea, vomiting, or diarrhea.  She reports she has some constipation.  She is not taking anything over-the-counter for this.  She mentions she sometimes will get rebound headaches after taking oxycodone.  She typically feels worse on Wednesday after treatment due to fatigue.  She has been having some hypotension at home. denies any headache or visual changes.  Her last day radiation is scheduled for 01/18/2023. She is here today for evaluation and repeat blood work before undergoing cycle #5.   MEDICAL HISTORY: Past Medical History:  Diagnosis Date   Alpha-1-antitrypsin deficiency carrier    Dyspnea    Emphysema lung ([facility])    Emphysema of lung ([facility])    Hypothyroidism    tumors    Scoliosis     ALLERGIES:  is allergic to paclitaxel.  MEDICATIONS:  Current Outpatient Medications  Medication Sig Dispense Refill   albuterol (VENTOLIN HFA) 108 (90 Base) MCG/ACT inhaler Inhale 2 puffs into the lungs daily. 1 each 0   FeFum-FePoly-FA-B Cmp-C-Biot (INTEGRA PLUS) CAPS Take 1 capsule by mouth daily. 30 capsule 3   Fluticasone-Umeclidin-Vilant (TRELEGY ELLIPTA) 100-62.5-25 MCG/ACT AEPB Inhale 1 puff into the lungs daily. 60 each 0  omeprazole (PRILOSEC) 20 MG capsule Take 1 capsule (20 mg total) by mouth daily. 30 capsule 1   oxyCODONE-acetaminophen (PERCOCET/ROXICET) 5-325 MG tablet Take 1 tablet by mouth every 8 (eight) hours as needed for severe pain. 30 tablet 0   prochlorperazine (COMPAZINE) 10 MG tablet Take 1 tablet (10 mg total) by mouth every 6 (six) hours as needed. 30 tablet 2   sucralfate (CARAFATE) 1 g tablet Take 1 tablet (1 g total) by mouth 4 (four) times daily.  Dissolve each tablet in 15 cc water before use. 120 tablet 2   No current facility-administered medications for this visit.    SURGICAL HISTORY:  Past Surgical History:  Procedure Laterality Date   BACK SURGERY     BRONCHIAL BIOPSY  11/15/2022   Procedure: BRONCHIAL BIOPSIES;  Surgeon: Garner Nash, DO;  Location: [facility];  Service: Pulmonary;;   BRONCHIAL NEEDLE ASPIRATION BIOPSY  11/15/2022   Procedure: BRONCHIAL NEEDLE ASPIRATION BIOPSIES;  Surgeon: Garner Nash, DO;  Location: [facility];  Service: Pulmonary;;   CESAREAN SECTION     FINE NEEDLE ASPIRATION  11/15/2022   Procedure: FINE NEEDLE ASPIRATION (FNA) LINEAR;  Surgeon: Garner Nash, DO;  Location: [facility];  Service: Pulmonary;;   VIDEO BRONCHOSCOPY WITH ENDOBRONCHIAL ULTRASOUND Bilateral 11/15/2022   Procedure: VIDEO BRONCHOSCOPY WITH ENDOBRONCHIAL ULTRASOUND;  Surgeon: Garner Nash, DO;  Location: [facility];  Service: Pulmonary;  Laterality: Bilateral;    REVIEW OF SYSTEMS:   Review of Systems  Constitutional: Also for fatigue and weakness few days following treatment.  Negative for appetite change, chills, fever and unexpected weight change.  HENT: Positive for taste alterations.  Negative for mouth sores, nosebleeds, sore throat and trouble swallowing.   Eyes: Negative for eye problems and icterus.  Respiratory: Negative for cough, hemoptysis, shortness of breath and wheezing.   Cardiovascular: Negative for chest pain and leg swelling.  Gastrointestinal: Positive for occasional constipation.  Negative for abdominal pain, diarrhea, nausea and vomiting.  Genitourinary: Negative for bladder incontinence, difficulty urinating, dysuria, frequency and hematuria.   Musculoskeletal: Negative for back pain, gait problem, neck pain and neck stiffness.  Skin: Negative for itching and rash.  Neurological: Negative for dizziness, extremity weakness, gait problem, headaches, light-headedness and seizures.   Hematological: Negative for adenopathy. Does not bruise/bleed easily.  Psychiatric/Behavioral: Negative for confusion, depression and sleep disturbance. The patient is not nervous/anxious.     PHYSICAL EXAMINATION:  Blood pressure 95/75, pulse 96, temperature 97.9 F (36.6 C), temperature source Temporal, resp. rate 15, height '5\' 5"'$  (1.651 m), weight 89 lb 9.6 oz (40.6 kg), SpO2 100 %.  ECOG PERFORMANCE STATUS: 1  Physical Exam  Constitutional: Oriented to person, place, and time and well-developed, well-nourished, and in no distress.  HENT:  Head: Normocephalic and atraumatic.  Mouth/Throat: Oropharynx is clear and moist. No oropharyngeal exudate.  Eyes: Conjunctivae are normal. Right eye exhibits no discharge. Left eye exhibits no discharge. No scleral icterus.  Neck: Normal range of motion. Neck supple.  Cardiovascular: Normal rate, regular rhythm, normal heart sounds and intact distal pulses.   Pulmonary/Chest: Effort normal and breath sounds normal. No respiratory distress. No wheezes. No rales.  Abdominal: Soft. Bowel sounds are normal. Exhibits no distension and no mass. There is no tenderness.  Musculoskeletal: Normal range of motion. Exhibits no edema.  Lymphadenopathy:    No cervical adenopathy.  Neurological: Alert and oriented to person, place, and time. Exhibits normal muscle tone. Gait normal. Coordination normal.  Skin: Skin is warm and  dry. No rash noted. Not diaphoretic. No erythema. No pallor.  Psychiatric: Mood, memory and judgment normal.  Vitals reviewed.  LABORATORY DATA: Lab Results  Component Value Date   WBC 3.4 (L) 01/02/2023   HGB 10.2 (L) 01/02/2023   HCT 33.3 (L) 01/02/2023   MCV 81.6 01/02/2023   PLT 301 01/02/2023      Chemistry      Component Value Date/Time   NA 139 01/02/2023 0840   K 4.5 01/02/2023 0840   CL 103 01/02/2023 0840   CO2 29 01/02/2023 0840   BUN 12 01/02/2023 0840   CREATININE 0.61 01/02/2023 0840      Component Value  Date/Time   CALCIUM 9.4 01/02/2023 0840   ALKPHOS 142 (H) 01/02/2023 0840   AST 41 01/02/2023 0840   ALT 53 (H) 01/02/2023 0840   BILITOT 0.2 (L) 01/02/2023 0840       RADIOGRAPHIC STUDIES:  No results found.   ASSESSMENT/PLAN:  This is a very pleasant 47 year old African-American female with stage IIb (T2b, N1, M0) non-small cell lung cancer with unknown subtype.  She was diagnosed in January 2024.  She presented with a large left upper lobe lung mass in addition to left hilar lymphadenopathy.  Her molecular studies by [employer] showed no actionable mutations.  The patient is currently undergoing a course of concurrent chemoradiation with carboplatin for an AUC of 2 and paclitaxel 45 mg/m.  Moving forward, since the patient had an allergy to Taxol, would recommend increasing Benadryl to 50 and Solu-Medrol 80.   If she has a reaction to Taxol again, would recommend changing this to Abraxane.  Previously reviewed premedication changes with Dr. Julien Nordmann.  Labs were reviewed.  Recommend that she proceed with cycle #5 today as schedule.  We will see her back for follow-up visit in 2 weeks for evaluation and repeat blood work before undergoing cycle #7.  Her last day radiation is tentatively scheduled for 01/18/2023.  The patient education was given to the patient on her AVS today.  I also encouraged to use salt water rinses and Biotene for her taste alterations.  We discussed foods that typically do not taste good to the patient's undergoing treatment with carboplatin and other foods that may taste good to her.  Encouraged her to hydrate well.  We will arrange for 1 L fluid over 2 hours today then patient room due to hypotension.  She does come in daily for radiation.  I encouraged her to call us ahead of time if she feels like she needs additional IV fluids  Follow-up with the [facility] department.  She states that she tried to submit short-term disability but she called our clinic the other day  who did not receive this paperwork.  She states that the disability department through her work states that they did submit the paperwork.  I reached out to the [facility] department.  The patient was encouraged to reach back out to the HR department at her work to email the disability paperwork to the number she was given today.  The patient is following closely with the dietitian for her weight loss.  Urged the patient to use Tylenol if needed for headaches.  The patient was advised to call immediately if she has any concerning symptoms in the interval. The patient voices understanding of current disease status and treatment options and is in agreement with the current care plan. All questions were answered. The patient knows to call the clinic with any problems, questions or concerns.  We can certainly see the patient much sooner if necessary     No orders of the defined types were placed in this encounter.    The total time spent in the appointment was 30-39 minutes.   Hussam Muniz L Rahkim Rabalais, PA-C 01/02/23

## 2022-12-30 ENCOUNTER — Other Ambulatory Visit: Payer: Self-pay

## 2022-12-30 ENCOUNTER — Ambulatory Visit
Admission: RE | Admit: 2022-12-30 | Discharge: 2022-12-30 | Disposition: A | Discharge status: Home / Self Care | Payer: BC Managed Care – PPO | Source: Ambulatory Visit | Attending: Radiation Oncology | Admitting: Radiation Oncology

## 2022-12-30 DIAGNOSIS — C3412 Malignant neoplasm of upper lobe, left bronchus or lung: Secondary | ICD-10-CM | POA: Diagnosis not present

## 2022-12-30 LAB — RAD ONC ARIA SESSION SUMMARY
Course Elapsed Days: 25
Plan Fractions Treated to Date: 20
Plan Prescribed Dose Per Fraction: 2 Gy
Plan Total Fractions Prescribed: 30
Plan Total Prescribed Dose: 60 Gy
Reference Point Dosage Given to Date: 40 Gy
Reference Point Session Dosage Given: 2 Gy
Session Number: 19

## 2023-01-02 ENCOUNTER — Inpatient Hospital Stay (HOSPITAL_BASED_OUTPATIENT_CLINIC_OR_DEPARTMENT_OTHER): Payer: BC Managed Care – PPO | Admitting: Physician Assistant

## 2023-01-02 ENCOUNTER — Other Ambulatory Visit: Payer: Self-pay

## 2023-01-02 ENCOUNTER — Inpatient Hospital Stay: Payer: BC Managed Care – PPO

## 2023-01-02 ENCOUNTER — Ambulatory Visit
Admission: RE | Admit: 2023-01-02 | Discharge: 2023-01-02 | Disposition: A | Discharge status: Home / Self Care | Payer: BC Managed Care – PPO | Source: Ambulatory Visit | Attending: Radiation Oncology | Admitting: Radiation Oncology

## 2023-01-02 VITALS — BP 95/75 | HR 96 | Temp 97.9°F | Resp 15 | Ht 65.0 in | Wt 89.6 lb

## 2023-01-02 VITALS — BP 106/71 | HR 97 | Resp 17

## 2023-01-02 DIAGNOSIS — I959 Hypotension, unspecified: Secondary | ICD-10-CM

## 2023-01-02 DIAGNOSIS — Z5111 Encounter for antineoplastic chemotherapy: Secondary | ICD-10-CM

## 2023-01-02 DIAGNOSIS — C3492 Malignant neoplasm of unspecified part of left bronchus or lung: Secondary | ICD-10-CM

## 2023-01-02 DIAGNOSIS — C3412 Malignant neoplasm of upper lobe, left bronchus or lung: Secondary | ICD-10-CM | POA: Diagnosis not present

## 2023-01-02 LAB — CMP (CANCER CENTER ONLY)
ALT: 53 U/L — ABNORMAL HIGH (ref 0–44)
AST: 41 U/L (ref 15–41)
Albumin: 3.4 g/dL — ABNORMAL LOW (ref 3.5–5.0)
Alkaline Phosphatase: 142 U/L — ABNORMAL HIGH (ref 38–126)
Anion gap: 7 (ref 5–15)
BUN: 12 mg/dL (ref 6–20)
CO2: 29 mmol/L (ref 22–32)
Calcium: 9.4 mg/dL (ref 8.9–10.3)
Chloride: 103 mmol/L (ref 98–111)
Creatinine: 0.61 mg/dL (ref 0.44–1.00)
GFR, Estimated: >60 mL/min (ref >60)
Glucose, Bld: 76 mg/dL (ref 70–99)
Potassium: 4.5 mmol/L (ref 3.5–5.1)
Sodium: 139 mmol/L (ref 135–145)
Total Bilirubin: 0.2 mg/dL — ABNORMAL LOW (ref 0.3–1.2)
Total Protein: 7.3 g/dL (ref 6.5–8.1)

## 2023-01-02 LAB — RAD ONC ARIA SESSION SUMMARY
Course Elapsed Days: 28
Plan Fractions Treated to Date: 21
Plan Prescribed Dose Per Fraction: 2 Gy
Plan Total Fractions Prescribed: 30
Plan Total Prescribed Dose: 60 Gy
Reference Point Dosage Given to Date: 42 Gy
Reference Point Session Dosage Given: 2 Gy
Session Number: 20

## 2023-01-02 LAB — CBC WITH DIFFERENTIAL (CANCER CENTER ONLY)
Abs Immature Granulocytes: 0.01 10*3/uL (ref 0.00–0.07)
Basophils Absolute: 0 10*3/uL (ref 0.0–0.1)
Basophils Relative: 1 %
Eosinophils Absolute: 0.1 10*3/uL (ref 0.0–0.5)
Eosinophils Relative: 2 %
HCT: 33.3 % — ABNORMAL LOW (ref 36.0–46.0)
Hemoglobin: 10.2 g/dL — ABNORMAL LOW (ref 12.0–15.0)
Immature Granulocytes: 0 %
Lymphocytes Relative: 16 %
Lymphs Abs: 0.5 10*3/uL — ABNORMAL LOW (ref 0.7–4.0)
MCH: 25 pg — ABNORMAL LOW (ref 26.0–34.0)
MCHC: 30.6 g/dL (ref 30.0–36.0)
MCV: 81.6 fL (ref 80.0–100.0)
Monocytes Absolute: 0.4 10*3/uL (ref 0.1–1.0)
Monocytes Relative: 11 %
Neutro Abs: 2.4 10*3/uL (ref 1.7–7.7)
Neutrophils Relative %: 70 %
Platelet Count: 301 10*3/uL (ref 150–400)
RBC: 4.08 MIL/uL (ref 3.87–5.11)
RDW: 18.6 % — ABNORMAL HIGH (ref 11.5–15.5)
WBC Count: 3.4 10*3/uL — ABNORMAL LOW (ref 4.0–10.5)
nRBC: 0 % (ref 0.0–0.2)

## 2023-01-02 MED ORDER — SODIUM CHLORIDE 0.9 % IV SOLN
45.0000 mg/m2 | Freq: Once | INTRAVENOUS | Status: AC
  Administered 2023-01-02: 60 mg via INTRAVENOUS
  Filled 2023-01-02: qty 10

## 2023-01-02 MED ORDER — METHYLPREDNISOLONE SODIUM SUCC 125 MG IJ SOLR
80.0000 mg | Freq: Once | INTRAMUSCULAR | Status: AC
  Administered 2023-01-02: 80 mg via INTRAVENOUS
  Filled 2023-01-02: qty 2

## 2023-01-02 MED ORDER — PALONOSETRON HCL INJECTION 0.25 MG/5ML
0.2500 mg | Freq: Once | INTRAVENOUS | Status: AC
  Administered 2023-01-02: 0.25 mg via INTRAVENOUS
  Filled 2023-01-02: qty 5

## 2023-01-02 MED ORDER — SODIUM CHLORIDE 0.9 % IV SOLN
157.4000 mg | Freq: Once | INTRAVENOUS | Status: AC
  Administered 2023-01-02: 150 mg via INTRAVENOUS
  Filled 2023-01-02: qty 15

## 2023-01-02 MED ORDER — SODIUM CHLORIDE 0.9 % IV SOLN: Freq: Once | INTRAVENOUS | Status: AC

## 2023-01-02 MED ORDER — DIPHENHYDRAMINE HCL 50 MG/ML IJ SOLN
50.0000 mg | Freq: Once | INTRAMUSCULAR | Status: AC
  Administered 2023-01-02: 50 mg via INTRAVENOUS
  Filled 2023-01-02: qty 1

## 2023-01-02 MED ORDER — FAMOTIDINE IN NACL 20-0.9 MG/50ML-% IV SOLN
20.0000 mg | Freq: Once | INTRAVENOUS | Status: AC
  Administered 2023-01-02: 20 mg via INTRAVENOUS
  Filled 2023-01-02: qty 50

## 2023-01-02 NOTE — Progress Notes (Signed)
Patient seen by PA today  Vitals are within treatment parameters.  Labs reviewed: and are within treatment parameters.  Per physician team, patient is ready for treatment and there are NO modifications to the treatment plan.

## 2023-01-02 NOTE — Progress Notes (Signed)
Nutrition Follow-up:  Patient with non small cell lung cancer.  Patient receiving concurrent chemotherapy and radiation.    Met with patient during infusion.  Patient reports taste alterations.  Eating potato chips during visit.  [employer] bag sitting on chairside table.  Says that she was able to eat all but a few bites of Big Mac and fries.  Has not been drinking very many shakes because of taste.      Medications: reviewed  Labs: reviewed  Anthropometrics:   89 lb 9.6 oz today  83 lb 12.8 oz on 2/12 88 lb on 1/17 98 lb on 9/19   NUTRITION DIAGNOSIS: Inadequate oral intake stable   INTERVENTION:  Discussed strategies to help with taste change.  Handout provided Encouraged continued high calorie, high protein foods     MONITORING, EVALUATION, GOAL: weight trends, intake   NEXT VISIT: Monday, March 11 during infusion  Lillyian Heidt B. Zenia Resides, [place], [place] Registered Dietitian [phone]

## 2023-01-02 NOTE — Patient Instructions (Signed)
[facility]   Discharge Instructions: Thank you for choosing [facility] to provide your oncology and hematology care.   If you have a lab appointment with the Cancer Center, please go directly to the Cancer Center and check in at the registration area.   Wear comfortable clothing and clothing appropriate for easy access to any Portacath or PICC line.   We strive to give you quality time with your provider. You may need to reschedule your appointment if you arrive late (15 or more minutes).  Arriving late affects you and other patients whose appointments are after yours.  Also, if you miss three or more appointments without notifying the office, you may be dismissed from the clinic at the provider's discretion.      For prescription refill requests, have your pharmacy contact our office and allow 72 hours for refills to be completed.    Today you received the following chemotherapy and/or immunotherapy agents: Paclitaxel (Taxol) and Carboplatin       To help prevent nausea and vomiting after your treatment, we encourage you to take your nausea medication as directed.  BELOW ARE SYMPTOMS THAT SHOULD BE REPORTED IMMEDIATELY: *FEVER GREATER THAN 100.4 F (38 C) OR HIGHER *CHILLS OR SWEATING *NAUSEA AND VOMITING THAT IS NOT CONTROLLED WITH YOUR NAUSEA MEDICATION *UNUSUAL SHORTNESS OF BREATH *UNUSUAL BRUISING OR BLEEDING *URINARY PROBLEMS (pain or burning when urinating, or frequent urination) *BOWEL PROBLEMS (unusual diarrhea, constipation, pain near the anus) TENDERNESS IN MOUTH AND THROAT WITH OR WITHOUT PRESENCE OF ULCERS (sore throat, sores in mouth, or a toothache) UNUSUAL RASH, SWELLING OR PAIN  UNUSUAL VAGINAL DISCHARGE OR ITCHING   Items with * indicate a potential emergency and should be followed up as soon as possible or go to the Emergency Department if any problems should occur.  Please show the CHEMOTHERAPY ALERT CARD or  IMMUNOTHERAPY ALERT CARD at check-in to the Emergency Department and triage nurse.  Should you have questions after your visit or need to cancel or reschedule your appointment, please contact [facility]  Dept: [phone]  and follow the prompts.  Office hours are 8:00 a.m. to 4:30 p.m. Monday - Friday. Please note that voicemails left after 4:00 p.m. may not be returned until the following business day.  We are closed weekends and major holidays. You have access to a nurse at all times for urgent questions. Please call the main number to the clinic Dept: [phone] and follow the prompts.   For any non-urgent questions, you may also contact your provider using MyChart. We now offer e-Visits for anyone 18 and older to request care online for non-urgent symptoms. For details visit [email or web address].   Also download the MyChart app! Go to the app store, search "MyChart", open the app, select [facility], and log in with your MyChart username and password.   

## 2023-01-02 NOTE — Patient Instructions (Signed)
It is common for patients who are undergoing treatment and taking certain prescribed medications to experience side-effects with constipation.  If you experience constipation, please take stool softener such as Colace or Senna one tablet twice a day everyday to avoid constipation.  These medications are available over the counter.  Of course, if you have diarrhea, stop taking stool softeners.  Drinking plenty of fluid, eating fruits and vegetable, and being active also reduces the risk of constipation.   If despite taking stool softeners, and you still have no bowel movement for 2 days or more than your normal bowel habit frequency, please take one of the following over the counter laxatives:  MiraLax, Milk of Magnesia or Mag Citrate everyday. The goal is to have at least one bowel movement every other day.

## 2023-01-03 ENCOUNTER — Other Ambulatory Visit: Payer: Self-pay

## 2023-01-03 ENCOUNTER — Ambulatory Visit
Admission: RE | Admit: 2023-01-03 | Discharge: 2023-01-03 | Disposition: A | Discharge status: Home / Self Care | Payer: BC Managed Care – PPO | Source: Ambulatory Visit | Attending: Radiation Oncology | Admitting: Radiation Oncology

## 2023-01-03 DIAGNOSIS — C3412 Malignant neoplasm of upper lobe, left bronchus or lung: Secondary | ICD-10-CM | POA: Diagnosis not present

## 2023-01-03 LAB — RAD ONC ARIA SESSION SUMMARY
Course Elapsed Days: 29
Plan Fractions Treated to Date: 22
Plan Prescribed Dose Per Fraction: 2 Gy
Plan Total Fractions Prescribed: 30
Plan Total Prescribed Dose: 60 Gy
Reference Point Dosage Given to Date: 44 Gy
Reference Point Session Dosage Given: 2 Gy
Session Number: 21

## 2023-01-04 ENCOUNTER — Ambulatory Visit
Admission: RE | Admit: 2023-01-04 | Discharge: 2023-01-04 | Disposition: A | Discharge status: Home / Self Care | Payer: BC Managed Care – PPO | Source: Ambulatory Visit | Attending: Radiation Oncology | Admitting: Radiation Oncology

## 2023-01-04 ENCOUNTER — Other Ambulatory Visit: Payer: Self-pay

## 2023-01-04 DIAGNOSIS — C3412 Malignant neoplasm of upper lobe, left bronchus or lung: Secondary | ICD-10-CM | POA: Diagnosis not present

## 2023-01-04 LAB — RAD ONC ARIA SESSION SUMMARY
Course Elapsed Days: 30
Plan Fractions Treated to Date: 23
Plan Prescribed Dose Per Fraction: 2 Gy
Plan Total Fractions Prescribed: 30
Plan Total Prescribed Dose: 60 Gy
Reference Point Dosage Given to Date: 46 Gy
Reference Point Session Dosage Given: 2 Gy
Session Number: 22

## 2023-01-05 ENCOUNTER — Other Ambulatory Visit: Payer: Self-pay

## 2023-01-05 ENCOUNTER — Ambulatory Visit
Admission: RE | Admit: 2023-01-05 | Discharge: 2023-01-05 | Disposition: A | Discharge status: Home / Self Care | Payer: BC Managed Care – PPO | Source: Ambulatory Visit | Attending: Radiation Oncology | Admitting: Radiation Oncology

## 2023-01-05 DIAGNOSIS — C3412 Malignant neoplasm of upper lobe, left bronchus or lung: Secondary | ICD-10-CM | POA: Diagnosis not present

## 2023-01-05 LAB — RAD ONC ARIA SESSION SUMMARY
Course Elapsed Days: 31
Plan Fractions Treated to Date: 24
Plan Prescribed Dose Per Fraction: 2 Gy
Plan Total Fractions Prescribed: 30
Plan Total Prescribed Dose: 60 Gy
Reference Point Dosage Given to Date: 48 Gy
Reference Point Session Dosage Given: 2 Gy
Session Number: 23

## 2023-01-06 ENCOUNTER — Other Ambulatory Visit: Payer: Self-pay

## 2023-01-06 ENCOUNTER — Ambulatory Visit
Admission: RE | Admit: 2023-01-06 | Discharge: 2023-01-06 | Disposition: A | Discharge status: Home / Self Care | Payer: BC Managed Care – PPO | Source: Ambulatory Visit | Attending: Radiation Oncology | Admitting: Radiation Oncology

## 2023-01-06 DIAGNOSIS — C3412 Malignant neoplasm of upper lobe, left bronchus or lung: Secondary | ICD-10-CM | POA: Insufficient documentation

## 2023-01-06 DIAGNOSIS — Z510 Encounter for antineoplastic radiation therapy: Secondary | ICD-10-CM | POA: Insufficient documentation

## 2023-01-06 DIAGNOSIS — Z5111 Encounter for antineoplastic chemotherapy: Secondary | ICD-10-CM | POA: Insufficient documentation

## 2023-01-06 DIAGNOSIS — Z79899 Other long term (current) drug therapy: Secondary | ICD-10-CM | POA: Diagnosis not present

## 2023-01-06 LAB — RAD ONC ARIA SESSION SUMMARY
Course Elapsed Days: 32
Plan Fractions Treated to Date: 25
Plan Prescribed Dose Per Fraction: 2 Gy
Plan Total Fractions Prescribed: 30
Plan Total Prescribed Dose: 60 Gy
Reference Point Dosage Given to Date: 50 Gy
Reference Point Session Dosage Given: 2 Gy
Session Number: 24

## 2023-01-09 ENCOUNTER — Other Ambulatory Visit: Payer: Self-pay

## 2023-01-09 ENCOUNTER — Inpatient Hospital Stay: Payer: BC Managed Care – PPO | Attending: Internal Medicine

## 2023-01-09 ENCOUNTER — Inpatient Hospital Stay: Payer: BC Managed Care – PPO

## 2023-01-09 ENCOUNTER — Ambulatory Visit
Admission: RE | Admit: 2023-01-09 | Discharge: 2023-01-09 | Disposition: A | Discharge status: Home / Self Care | Payer: BC Managed Care – PPO | Source: Ambulatory Visit | Attending: Radiation Oncology | Admitting: Radiation Oncology

## 2023-01-09 ENCOUNTER — Other Ambulatory Visit: Payer: Self-pay | Admitting: Internal Medicine

## 2023-01-09 VITALS — BP 106/71 | HR 94 | Temp 98.3°F | Resp 16 | Wt 91.4 lb

## 2023-01-09 DIAGNOSIS — C3412 Malignant neoplasm of upper lobe, left bronchus or lung: Secondary | ICD-10-CM | POA: Diagnosis not present

## 2023-01-09 DIAGNOSIS — C3492 Malignant neoplasm of unspecified part of left bronchus or lung: Secondary | ICD-10-CM

## 2023-01-09 DIAGNOSIS — D6481 Anemia due to antineoplastic chemotherapy: Secondary | ICD-10-CM

## 2023-01-09 LAB — RAD ONC ARIA SESSION SUMMARY
Course Elapsed Days: 35
Plan Fractions Treated to Date: 26
Plan Prescribed Dose Per Fraction: 2 Gy
Plan Total Fractions Prescribed: 30
Plan Total Prescribed Dose: 60 Gy
Reference Point Dosage Given to Date: 52 Gy
Reference Point Session Dosage Given: 2 Gy
Session Number: 25

## 2023-01-09 LAB — CBC WITH DIFFERENTIAL (CANCER CENTER ONLY)
Abs Immature Granulocytes: 0.01 10*3/uL (ref 0.00–0.07)
Basophils Absolute: 0 10*3/uL (ref 0.0–0.1)
Basophils Relative: 1 %
Eosinophils Absolute: 0.1 10*3/uL (ref 0.0–0.5)
Eosinophils Relative: 3 %
HCT: 29.5 % — ABNORMAL LOW (ref 36.0–46.0)
Hemoglobin: 9.2 g/dL — ABNORMAL LOW (ref 12.0–15.0)
Immature Granulocytes: 0 %
Lymphocytes Relative: 18 %
Lymphs Abs: 0.5 10*3/uL — ABNORMAL LOW (ref 0.7–4.0)
MCH: 25.4 pg — ABNORMAL LOW (ref 26.0–34.0)
MCHC: 31.2 g/dL (ref 30.0–36.0)
MCV: 81.5 fL (ref 80.0–100.0)
Monocytes Absolute: 0.4 10*3/uL (ref 0.1–1.0)
Monocytes Relative: 14 %
Neutro Abs: 1.7 10*3/uL (ref 1.7–7.7)
Neutrophils Relative %: 64 %
Platelet Count: 247 10*3/uL (ref 150–400)
RBC: 3.62 MIL/uL — ABNORMAL LOW (ref 3.87–5.11)
RDW: 18.7 % — ABNORMAL HIGH (ref 11.5–15.5)
WBC Count: 2.7 10*3/uL — ABNORMAL LOW (ref 4.0–10.5)
nRBC: 0 % (ref 0.0–0.2)

## 2023-01-09 LAB — CMP (CANCER CENTER ONLY)
ALT: 31 U/L (ref 0–44)
AST: 28 U/L (ref 15–41)
Albumin: 3.5 g/dL (ref 3.5–5.0)
Alkaline Phosphatase: 102 U/L (ref 38–126)
Anion gap: 6 (ref 5–15)
BUN: 8 mg/dL (ref 6–20)
CO2: 27 mmol/L (ref 22–32)
Calcium: 9 mg/dL (ref 8.9–10.3)
Chloride: 104 mmol/L (ref 98–111)
Creatinine: 0.59 mg/dL (ref 0.44–1.00)
GFR, Estimated: >60 mL/min (ref >60)
Glucose, Bld: 77 mg/dL (ref 70–99)
Potassium: 3.7 mmol/L (ref 3.5–5.1)
Sodium: 137 mmol/L (ref 135–145)
Total Bilirubin: 0.2 mg/dL — ABNORMAL LOW (ref 0.3–1.2)
Total Protein: 6.9 g/dL (ref 6.5–8.1)

## 2023-01-09 LAB — SAMPLE TO BLOOD BANK

## 2023-01-09 MED ORDER — FAMOTIDINE IN NACL 20-0.9 MG/50ML-% IV SOLN
20.0000 mg | Freq: Once | INTRAVENOUS | Status: AC
  Administered 2023-01-09: 20 mg via INTRAVENOUS
  Filled 2023-01-09: qty 50

## 2023-01-09 MED ORDER — SODIUM CHLORIDE 0.9 % IV SOLN
157.4000 mg | Freq: Once | INTRAVENOUS | Status: AC
  Administered 2023-01-09: 150 mg via INTRAVENOUS
  Filled 2023-01-09: qty 15

## 2023-01-09 MED ORDER — SODIUM CHLORIDE 0.9 % IV SOLN
45.0000 mg/m2 | Freq: Once | INTRAVENOUS | Status: AC
  Administered 2023-01-09: 60 mg via INTRAVENOUS
  Filled 2023-01-09: qty 10

## 2023-01-09 MED ORDER — SODIUM CHLORIDE 0.9 % IV SOLN: Freq: Once | INTRAVENOUS | Status: AC

## 2023-01-09 MED ORDER — METHYLPREDNISOLONE SODIUM SUCC 125 MG IJ SOLR
80.0000 mg | Freq: Once | INTRAMUSCULAR | Status: AC
  Administered 2023-01-09: 80 mg via INTRAVENOUS
  Filled 2023-01-09: qty 2

## 2023-01-09 MED ORDER — DIPHENHYDRAMINE HCL 50 MG/ML IJ SOLN
50.0000 mg | Freq: Once | INTRAMUSCULAR | Status: AC
  Administered 2023-01-09: 50 mg via INTRAVENOUS
  Filled 2023-01-09: qty 1

## 2023-01-09 MED ORDER — PALONOSETRON HCL INJECTION 0.25 MG/5ML
0.2500 mg | Freq: Once | INTRAVENOUS | Status: AC
  Administered 2023-01-09: 0.25 mg via INTRAVENOUS
  Filled 2023-01-09: qty 5

## 2023-01-09 NOTE — Patient Instructions (Signed)
[facility]  Discharge Instructions: Thank you for choosing [facility] to provide your oncology and hematology care.   If you have a lab appointment with the [facility], please go directly to the [facility] and check in at the registration area.   Wear comfortable clothing and clothing appropriate for easy access to any Portacath or PICC line.   We strive to give you quality time with your provider. You may need to reschedule your appointment if you arrive late (15 or more minutes).  Arriving late affects you and other patients whose appointments are after yours.  Also, if you miss three or more appointments without notifying the office, you may be dismissed from the clinic at the provider's discretion.      For prescription refill requests, have your pharmacy contact our office and allow 72 hours for refills to be completed.    Today you received the following chemotherapy and/or immunotherapy agents: Taxol/Carboplatin   To help prevent nausea and vomiting after your treatment, we encourage you to take your nausea medication as directed.  BELOW ARE SYMPTOMS THAT SHOULD BE REPORTED IMMEDIATELY: *FEVER GREATER THAN 100.4 F (38 C) OR HIGHER *CHILLS OR SWEATING *NAUSEA AND VOMITING THAT IS NOT CONTROLLED WITH YOUR NAUSEA MEDICATION *UNUSUAL SHORTNESS OF BREATH *UNUSUAL BRUISING OR BLEEDING *URINARY PROBLEMS (pain or burning when urinating, or frequent urination) *BOWEL PROBLEMS (unusual diarrhea, constipation, pain near the anus) TENDERNESS IN MOUTH AND THROAT WITH OR WITHOUT PRESENCE OF ULCERS (sore throat, sores in mouth, or a toothache) UNUSUAL RASH, SWELLING OR PAIN  UNUSUAL VAGINAL DISCHARGE OR ITCHING   Items with * indicate a potential emergency and should be followed up as soon as possible or go to the Emergency Department if any problems should occur.  Please show the CHEMOTHERAPY ALERT CARD or IMMUNOTHERAPY ALERT CARD at  check-in to the Emergency Department and triage nurse.  Should you have questions after your visit or need to cancel or reschedule your appointment, please contact [facility]  Dept: [phone]  and follow the prompts.  Office hours are 8:00 a.m. to 4:30 p.m. Monday - Friday. Please note that voicemails left after 4:00 p.m. may not be returned until the following business day.  We are closed weekends and major holidays. You have access to a nurse at all times for urgent questions. Please call the main number to the clinic Dept: [phone] and follow the prompts.   For any non-urgent questions, you may also contact your provider using MyChart. We now offer e-Visits for anyone 22 and older to request care online for non-urgent symptoms. For details visit [email or web address].   Also download the MyChart app! Go to the app store, search "MyChart", open the app, select [facility], and log in with your MyChart username and password.

## 2023-01-10 ENCOUNTER — Other Ambulatory Visit: Payer: Self-pay

## 2023-01-10 ENCOUNTER — Ambulatory Visit
Admission: RE | Admit: 2023-01-10 | Discharge: 2023-01-10 | Disposition: A | Discharge status: Home / Self Care | Payer: BC Managed Care – PPO | Source: Ambulatory Visit | Attending: Radiation Oncology | Admitting: Radiation Oncology

## 2023-01-10 DIAGNOSIS — C3412 Malignant neoplasm of upper lobe, left bronchus or lung: Secondary | ICD-10-CM | POA: Diagnosis not present

## 2023-01-10 LAB — RAD ONC ARIA SESSION SUMMARY
Course Elapsed Days: 36
Plan Fractions Treated to Date: 27
Plan Prescribed Dose Per Fraction: 2 Gy
Plan Total Fractions Prescribed: 30
Plan Total Prescribed Dose: 60 Gy
Reference Point Dosage Given to Date: 54 Gy
Reference Point Session Dosage Given: 2 Gy
Session Number: 26

## 2023-01-11 ENCOUNTER — Ambulatory Visit
Admission: RE | Admit: 2023-01-11 | Discharge: 2023-01-11 | Disposition: A | Discharge status: Home / Self Care | Payer: BC Managed Care – PPO | Source: Ambulatory Visit | Attending: Radiation Oncology | Admitting: Radiation Oncology

## 2023-01-11 ENCOUNTER — Other Ambulatory Visit: Payer: Self-pay

## 2023-01-11 DIAGNOSIS — C3412 Malignant neoplasm of upper lobe, left bronchus or lung: Secondary | ICD-10-CM | POA: Diagnosis not present

## 2023-01-11 LAB — RAD ONC ARIA SESSION SUMMARY
Course Elapsed Days: 37
Plan Fractions Treated to Date: 28
Plan Prescribed Dose Per Fraction: 2 Gy
Plan Total Fractions Prescribed: 30
Plan Total Prescribed Dose: 60 Gy
Reference Point Dosage Given to Date: 56 Gy
Reference Point Session Dosage Given: 2 Gy
Session Number: 27

## 2023-01-12 ENCOUNTER — Ambulatory Visit
Admission: RE | Admit: 2023-01-12 | Discharge: 2023-01-12 | Disposition: A | Discharge status: Home / Self Care | Payer: BC Managed Care – PPO | Source: Ambulatory Visit | Attending: Radiation Oncology | Admitting: Radiation Oncology

## 2023-01-12 ENCOUNTER — Other Ambulatory Visit: Payer: Self-pay

## 2023-01-12 DIAGNOSIS — C3412 Malignant neoplasm of upper lobe, left bronchus or lung: Secondary | ICD-10-CM | POA: Diagnosis not present

## 2023-01-12 LAB — RAD ONC ARIA SESSION SUMMARY
Course Elapsed Days: 38
Plan Fractions Treated to Date: 29
Plan Prescribed Dose Per Fraction: 2 Gy
Plan Total Fractions Prescribed: 30
Plan Total Prescribed Dose: 60 Gy
Reference Point Dosage Given to Date: 58 Gy
Reference Point Session Dosage Given: 2 Gy
Session Number: 28

## 2023-01-13 ENCOUNTER — Ambulatory Visit
Admission: RE | Admit: 2023-01-13 | Discharge: 2023-01-13 | Disposition: A | Discharge status: Home / Self Care | Payer: BC Managed Care – PPO | Source: Ambulatory Visit | Attending: Radiation Oncology | Admitting: Radiation Oncology

## 2023-01-13 ENCOUNTER — Telehealth: Payer: Self-pay

## 2023-01-13 ENCOUNTER — Other Ambulatory Visit: Payer: Self-pay

## 2023-01-13 DIAGNOSIS — C3412 Malignant neoplasm of upper lobe, left bronchus or lung: Secondary | ICD-10-CM | POA: Diagnosis not present

## 2023-01-13 LAB — RAD ONC ARIA SESSION SUMMARY
Course Elapsed Days: 39
Plan Fractions Treated to Date: 30
Plan Prescribed Dose Per Fraction: 2 Gy
Plan Total Fractions Prescribed: 30
Plan Total Prescribed Dose: 60 Gy
Reference Point Dosage Given to Date: 60 Gy
Reference Point Session Dosage Given: 2 Gy
Session Number: 29

## 2023-01-13 NOTE — Telephone Encounter (Signed)
Notified Patient of completion of Disability Attending 85 Statement. Fax transmission confirmation received. Copy of form mailed to Patient as requested. No other needs or concerns voiced at this time.

## 2023-01-16 ENCOUNTER — Inpatient Hospital Stay: Payer: BC Managed Care – PPO

## 2023-01-16 ENCOUNTER — Ambulatory Visit
Admission: RE | Admit: 2023-01-16 | Discharge: 2023-01-16 | Disposition: A | Discharge status: Home / Self Care | Payer: BC Managed Care – PPO | Source: Ambulatory Visit | Attending: Radiation Oncology | Admitting: Radiation Oncology

## 2023-01-16 ENCOUNTER — Inpatient Hospital Stay (HOSPITAL_BASED_OUTPATIENT_CLINIC_OR_DEPARTMENT_OTHER): Payer: BC Managed Care – PPO | Admitting: Internal Medicine

## 2023-01-16 ENCOUNTER — Other Ambulatory Visit: Payer: Self-pay

## 2023-01-16 VITALS — HR 92 | Temp 97.9°F | Resp 16 | Wt 94.5 lb

## 2023-01-16 VITALS — BP 90/70 | HR 89 | Temp 98.2°F | Resp 17

## 2023-01-16 DIAGNOSIS — C3412 Malignant neoplasm of upper lobe, left bronchus or lung: Secondary | ICD-10-CM | POA: Diagnosis not present

## 2023-01-16 DIAGNOSIS — C3492 Malignant neoplasm of unspecified part of left bronchus or lung: Secondary | ICD-10-CM

## 2023-01-16 DIAGNOSIS — C3490 Malignant neoplasm of unspecified part of unspecified bronchus or lung: Secondary | ICD-10-CM | POA: Diagnosis not present

## 2023-01-16 DIAGNOSIS — D6481 Anemia due to antineoplastic chemotherapy: Secondary | ICD-10-CM

## 2023-01-16 LAB — CMP (CANCER CENTER ONLY)
ALT: 31 U/L (ref 0–44)
AST: 28 U/L (ref 15–41)
Albumin: 3.8 g/dL (ref 3.5–5.0)
Alkaline Phosphatase: 77 U/L (ref 38–126)
Anion gap: 4 — ABNORMAL LOW (ref 5–15)
BUN: 8 mg/dL (ref 6–20)
CO2: 28 mmol/L (ref 22–32)
Calcium: 10 mg/dL (ref 8.9–10.3)
Chloride: 105 mmol/L (ref 98–111)
Creatinine: 0.64 mg/dL (ref 0.44–1.00)
GFR, Estimated: >60 mL/min (ref >60)
Glucose, Bld: 73 mg/dL (ref 70–99)
Potassium: 5.1 mmol/L (ref 3.5–5.1)
Sodium: 137 mmol/L (ref 135–145)
Total Bilirubin: 0.3 mg/dL (ref 0.3–1.2)
Total Protein: 7.3 g/dL (ref 6.5–8.1)

## 2023-01-16 LAB — CBC WITH DIFFERENTIAL (CANCER CENTER ONLY)
Abs Immature Granulocytes: 0.03 10*3/uL (ref 0.00–0.07)
Basophils Absolute: 0 10*3/uL (ref 0.0–0.1)
Basophils Relative: 1 %
Eosinophils Absolute: 0.1 10*3/uL (ref 0.0–0.5)
Eosinophils Relative: 2 %
HCT: 32 % — ABNORMAL LOW (ref 36.0–46.0)
Hemoglobin: 9.7 g/dL — ABNORMAL LOW (ref 12.0–15.0)
Immature Granulocytes: 1 %
Lymphocytes Relative: 13 %
Lymphs Abs: 0.4 10*3/uL — ABNORMAL LOW (ref 0.7–4.0)
MCH: 25.3 pg — ABNORMAL LOW (ref 26.0–34.0)
MCHC: 30.3 g/dL (ref 30.0–36.0)
MCV: 83.6 fL (ref 80.0–100.0)
Monocytes Absolute: 0.3 10*3/uL (ref 0.1–1.0)
Monocytes Relative: 11 %
Neutro Abs: 2.2 10*3/uL (ref 1.7–7.7)
Neutrophils Relative %: 72 %
Platelet Count: 170 10*3/uL (ref 150–400)
RBC: 3.83 MIL/uL — ABNORMAL LOW (ref 3.87–5.11)
RDW: 19.4 % — ABNORMAL HIGH (ref 11.5–15.5)
WBC Count: 3.1 10*3/uL — ABNORMAL LOW (ref 4.0–10.5)
nRBC: 0 % (ref 0.0–0.2)

## 2023-01-16 LAB — SAMPLE TO BLOOD BANK

## 2023-01-16 LAB — RAD ONC ARIA SESSION SUMMARY
Course Elapsed Days: 42
Plan Fractions Treated to Date: 1
Plan Prescribed Dose Per Fraction: 2 Gy
Plan Total Fractions Prescribed: 3
Plan Total Prescribed Dose: 6 Gy
Reference Point Dosage Given to Date: 2 Gy
Reference Point Session Dosage Given: 2 Gy
Session Number: 30

## 2023-01-16 MED ORDER — SODIUM CHLORIDE 0.9 % IV SOLN
45.0000 mg/m2 | Freq: Once | INTRAVENOUS | Status: AC
  Administered 2023-01-16: 60 mg via INTRAVENOUS
  Filled 2023-01-16: qty 10

## 2023-01-16 MED ORDER — SODIUM CHLORIDE 0.9 % IV SOLN: Freq: Once | INTRAVENOUS | Status: AC

## 2023-01-16 MED ORDER — DIPHENHYDRAMINE HCL 50 MG/ML IJ SOLN
50.0000 mg | Freq: Once | INTRAMUSCULAR | Status: AC
  Administered 2023-01-16: 50 mg via INTRAVENOUS
  Filled 2023-01-16: qty 1

## 2023-01-16 MED ORDER — SODIUM CHLORIDE 0.9 % IV SOLN
157.4000 mg | Freq: Once | INTRAVENOUS | Status: AC
  Administered 2023-01-16: 160 mg via INTRAVENOUS
  Filled 2023-01-16: qty 16

## 2023-01-16 MED ORDER — PALONOSETRON HCL INJECTION 0.25 MG/5ML
0.2500 mg | Freq: Once | INTRAVENOUS | Status: AC
  Administered 2023-01-16: 0.25 mg via INTRAVENOUS
  Filled 2023-01-16: qty 5

## 2023-01-16 MED ORDER — FAMOTIDINE IN NACL 20-0.9 MG/50ML-% IV SOLN
20.0000 mg | Freq: Once | INTRAVENOUS | Status: AC
  Administered 2023-01-16: 20 mg via INTRAVENOUS
  Filled 2023-01-16: qty 50

## 2023-01-16 MED ORDER — METHYLPREDNISOLONE SODIUM SUCC 125 MG IJ SOLR
80.0000 mg | Freq: Once | INTRAMUSCULAR | Status: AC
  Administered 2023-01-16: 80 mg via INTRAVENOUS
  Filled 2023-01-16: qty 2

## 2023-01-16 NOTE — Patient Instructions (Signed)
[facility]  Discharge Instructions: Thank you for choosing [facility] to provide your oncology and hematology care.   If you have a lab appointment with the Cancer Center, please go directly to the Cancer Center and check in at the registration area.   Wear comfortable clothing and clothing appropriate for easy access to any Portacath or PICC line.   We strive to give you quality time with your provider. You may need to reschedule your appointment if you arrive late (15 or more minutes).  Arriving late affects you and other patients whose appointments are after yours.  Also, if you miss three or more appointments without notifying the office, you may be dismissed from the clinic at the provider's discretion.      For prescription refill requests, have your pharmacy contact our office and allow 72 hours for refills to be completed.    Today you received the following chemotherapy and/or immunotherapy agents :  Paclitaxel & Carboplatin.      To help prevent nausea and vomiting after your treatment, we encourage you to take your nausea medication as directed.  BELOW ARE SYMPTOMS THAT SHOULD BE REPORTED IMMEDIATELY: *FEVER GREATER THAN 100.4 F (38 C) OR HIGHER *CHILLS OR SWEATING *NAUSEA AND VOMITING THAT IS NOT CONTROLLED WITH YOUR NAUSEA MEDICATION *UNUSUAL SHORTNESS OF BREATH *UNUSUAL BRUISING OR BLEEDING *URINARY PROBLEMS (pain or burning when urinating, or frequent urination) *BOWEL PROBLEMS (unusual diarrhea, constipation, pain near the anus) TENDERNESS IN MOUTH AND THROAT WITH OR WITHOUT PRESENCE OF ULCERS (sore throat, sores in mouth, or a toothache) UNUSUAL RASH, SWELLING OR PAIN  UNUSUAL VAGINAL DISCHARGE OR ITCHING   Items with * indicate a potential emergency and should be followed up as soon as possible or go to the Emergency Department if any problems should occur.  Please show the CHEMOTHERAPY ALERT CARD or IMMUNOTHERAPY  ALERT CARD at check-in to the Emergency Department and triage nurse.  Should you have questions after your visit or need to cancel or reschedule your appointment, please contact [facility]  Dept: [phone]  and follow the prompts.  Office hours are 8:00 a.m. to 4:30 p.m. Monday - Friday. Please note that voicemails left after 4:00 p.m. may not be returned until the following business day.  We are closed weekends and major holidays. You have access to a nurse at all times for urgent questions. Please call the main number to the clinic Dept: [phone] and follow the prompts.   For any non-urgent questions, you may also contact your provider using MyChart. We now offer e-Visits for anyone 18 and older to request care online for non-urgent symptoms. For details visit [email or web address].   Also download the MyChart app! Go to the app store, search "MyChart", open the app, select [facility], and log in with your MyChart username and password. 

## 2023-01-16 NOTE — Progress Notes (Signed)
[facility] [phone]   [phone]  OFFICE PROGRESS NOTE  Lujean Amel, MD [place] [address] [place] [address]  DIAGNOSIS: Stage IIb (T2b, N1, M0) non-small cell lung cancer with unknown subtype pending additional immunohistochemical stains of the lymph node biopsy diagnosed in January 2024 and presented with large left upper lobe lung mass in addition to left hilar adenopathy.    Detected Alteration(s) / Biomarker(s) Associated FDA-approved therapies Clinical Trial Availability % cfDNA or Amplification TP53 R213L None Yes 0.4%   PRIOR THERAPY: None   CURRENT THERAPY: Concurrent chemoradiation with weekly carboplatin for AUC of 2 and paclitaxel 45 Mg/M2.  First dose December 05, 2022.  Status post 6 cycles.  INTERVAL HISTORY: Elaine Gibson 47 y.o. female returns to the clinic today for follow-up visit.  The patient is feeling fine today with no concerning complaints.  She has been tolerating her treatment with concurrent chemoradiation fairly well.  She denied having any current chest pain, shortness of breath, cough or hemoptysis.  She has no nausea, vomiting, diarrhea or constipation.  She has no headache or visual changes.  She is here today for evaluation before starting the last cycle of her treatment.  MEDICAL HISTORY: Past Medical History:  Diagnosis Date   Alpha-1-antitrypsin deficiency carrier    Dyspnea    Emphysema lung ([place])    Emphysema of lung ([facility])    Hypothyroidism    tumors    Scoliosis     ALLERGIES:  is allergic to paclitaxel.  MEDICATIONS:  Current Outpatient Medications  Medication Sig Dispense Refill   albuterol (VENTOLIN HFA) 108 (90 Base) MCG/ACT inhaler Inhale 2 puffs into the lungs daily. 1 each 0   FeFum-FePoly-FA-B Cmp-C-Biot (INTEGRA PLUS) CAPS Take 1 capsule by mouth daily. 30 capsule 3   Fluticasone-Umeclidin-Vilant (TRELEGY ELLIPTA) 100-62.5-25 MCG/ACT AEPB Inhale 1 puff into  the lungs daily. 60 each 0   omeprazole (PRILOSEC) 20 MG capsule Take 1 capsule (20 mg total) by mouth daily. 30 capsule 1   oxyCODONE-acetaminophen (PERCOCET/ROXICET) 5-325 MG tablet Take 1 tablet by mouth every 8 (eight) hours as needed for severe pain. 30 tablet 0   prochlorperazine (COMPAZINE) 10 MG tablet Take 1 tablet (10 mg total) by mouth every 6 (six) hours as needed. 30 tablet 2   sucralfate (CARAFATE) 1 g tablet Take 1 tablet (1 g total) by mouth 4 (four) times daily. Dissolve each tablet in 15 cc water before use. 120 tablet 2   No current facility-administered medications for this visit.    SURGICAL HISTORY:  Past Surgical History:  Procedure Laterality Date   BACK SURGERY     BRONCHIAL BIOPSY  11/15/2022   Procedure: BRONCHIAL BIOPSIES;  Surgeon: Garner Nash, DO;  Location: [facility];  Service: Pulmonary;;   BRONCHIAL NEEDLE ASPIRATION BIOPSY  11/15/2022   Procedure: BRONCHIAL NEEDLE ASPIRATION BIOPSIES;  Surgeon: Garner Nash, DO;  Location: [facility];  Service: Pulmonary;;   CESAREAN SECTION     FINE NEEDLE ASPIRATION  11/15/2022   Procedure: FINE NEEDLE ASPIRATION (FNA) LINEAR;  Surgeon: Garner Nash, DO;  Location: [facility];  Service: Pulmonary;;   VIDEO BRONCHOSCOPY WITH ENDOBRONCHIAL ULTRASOUND Bilateral 11/15/2022   Procedure: VIDEO BRONCHOSCOPY WITH ENDOBRONCHIAL ULTRASOUND;  Surgeon: Garner Nash, DO;  Location: [facility];  Service: Pulmonary;  Laterality: Bilateral;    REVIEW OF SYSTEMS:  A comprehensive review of systems was negative except for: Constitutional: positive for fatigue  PHYSICAL EXAMINATION: General appearance: alert, cooperative, fatigued, and no distress Head: Normocephalic, without obvious abnormality, atraumatic Neck: no adenopathy, no JVD, supple, symmetrical, trachea midline, and thyroid not enlarged, symmetric, no tenderness/mass/nodules Lymph nodes: Cervical, supraclavicular, and axillary nodes normal. Resp: clear to  auscultation bilaterally Back: symmetric, no curvature. ROM normal. No CVA tenderness. Cardio: regular rate and rhythm, S1, S2 normal, no murmur, click, rub or gallop GI: soft, non-tender; bowel sounds normal; no masses,  no organomegaly Extremities: extremities normal, atraumatic, no cyanosis or edema  ECOG PERFORMANCE STATUS: 1 - Symptomatic but completely ambulatory  Pulse 92, temperature 97.9 F (36.6 C), temperature source Oral, resp. rate 16, weight 94 lb 8 oz (42.9 kg), SpO2 100 %.  LABORATORY DATA: Lab Results  Component Value Date   WBC 2.7 (L) 01/09/2023   HGB 9.2 (L) 01/09/2023   HCT 29.5 (L) 01/09/2023   MCV 81.5 01/09/2023   PLT 247 01/09/2023      Chemistry      Component Value Date/Time   NA 137 01/09/2023 1303   K 3.7 01/09/2023 1303   CL 104 01/09/2023 1303   CO2 27 01/09/2023 1303   BUN 8 01/09/2023 1303   CREATININE 0.59 01/09/2023 1303      Component Value Date/Time   CALCIUM 9.0 01/09/2023 1303   ALKPHOS 102 01/09/2023 1303   AST 28 01/09/2023 1303   ALT 31 01/09/2023 1303   BILITOT 0.2 (L) 01/09/2023 1303       RADIOGRAPHIC STUDIES: No results found.  ASSESSMENT AND PLAN: This is a very pleasant 47 years old African-American female with Stage IIb (T2b, N1, M0) non-small cell lung cancer with unknown subtype diagnosed in January 2024 and presented with large left upper lobe lung mass in addition to left hilar adenopathy.  Molecular studies by [employer] showed no actionable mutations The patient is here today to start the first cycle of concurrent chemoradiation with weekly carboplatin for AUC of 2 and paclitaxel 45 Mg/M2.  She is status post 6 cycles. I recommended for the patient to proceed with the last cycle of her concurrent chemoradiation today as planned. I will see her back for follow-up visit in around 4 weeks with repeat CT scan of the chest for restaging of her disease. The patient was advised to call immediately if she has any  concerning symptoms in the interval. The patient voices understanding of current disease status and treatment options and is in agreement with the current care plan.  All questions were answered. The patient knows to call the clinic with any problems, questions or concerns. We can certainly see the patient much sooner if necessary.  The total time spent in the appointment was 20 minutes.  Disclaimer: This note was dictated with voice recognition software. Similar sounding words can inadvertently be transcribed and may not be corrected upon review.

## 2023-01-16 NOTE — Progress Notes (Signed)
Nutrition Follow-up:  Patient with non small cell lung cancer.  Patient receiving cycle 6 chemotherapy today and last radiation due 3/13.    Met with patient during infusion.  Reports that she is still eating despite foods having no taste.  Yesterday ate nachos, 2 hot dogs and eggs and bacon and fruits.  Has not been drinking shakes.  Denies nausea or constipation.    Medications: reviewed  Labs: reviewed  Anthropometrics:   Weight 94 lb 8 oz today  89 lb 9.6 oz on 2/26 83 lb 12.8 oz on 2/12 88 lb on 1/17 98 lb on 9/19   NUTRITION DIAGNOSIS: Inadequate oral intake stable   INTERVENTION:  Continue high calorie high protein foods for weight gain     MONITORING, EVALUATION, GOAL: weight trends, intake   NEXT VISIT: as needed  Elaine Gibson, [place], [place] Registered Dietitian [phone]

## 2023-01-17 ENCOUNTER — Ambulatory Visit
Admission: RE | Admit: 2023-01-17 | Discharge: 2023-01-17 | Disposition: A | Discharge status: Home / Self Care | Payer: BC Managed Care – PPO | Source: Ambulatory Visit | Attending: Radiation Oncology | Admitting: Radiation Oncology

## 2023-01-17 ENCOUNTER — Other Ambulatory Visit: Payer: Self-pay

## 2023-01-17 DIAGNOSIS — C3412 Malignant neoplasm of upper lobe, left bronchus or lung: Secondary | ICD-10-CM | POA: Diagnosis not present

## 2023-01-17 LAB — RAD ONC ARIA SESSION SUMMARY
Course Elapsed Days: 43
Plan Fractions Treated to Date: 2
Plan Prescribed Dose Per Fraction: 2 Gy
Plan Total Fractions Prescribed: 3
Plan Total Prescribed Dose: 6 Gy
Reference Point Dosage Given to Date: 4 Gy
Reference Point Session Dosage Given: 2 Gy
Session Number: 31

## 2023-01-18 ENCOUNTER — Encounter: Payer: Self-pay | Admitting: Radiation Oncology

## 2023-01-18 ENCOUNTER — Ambulatory Visit
Admission: RE | Admit: 2023-01-18 | Discharge: 2023-01-18 | Disposition: A | Discharge status: Home / Self Care | Payer: BC Managed Care – PPO | Source: Ambulatory Visit | Attending: Radiation Oncology | Admitting: Radiation Oncology

## 2023-01-18 ENCOUNTER — Other Ambulatory Visit: Payer: Self-pay

## 2023-01-18 DIAGNOSIS — C3412 Malignant neoplasm of upper lobe, left bronchus or lung: Secondary | ICD-10-CM | POA: Diagnosis not present

## 2023-01-18 LAB — RAD ONC ARIA SESSION SUMMARY
Course Elapsed Days: 44
Plan Fractions Treated to Date: 3
Plan Prescribed Dose Per Fraction: 2 Gy
Plan Total Fractions Prescribed: 3
Plan Total Prescribed Dose: 6 Gy
Reference Point Dosage Given to Date: 6 Gy
Reference Point Session Dosage Given: 2 Gy
Session Number: 32

## 2023-01-23 ENCOUNTER — Encounter: Payer: Self-pay | Admitting: Internal Medicine

## 2023-01-23 NOTE — Progress Notes (Signed)
                                                                                                                                                             Patient Name: UNNAMED FORSYTH MRN: [id number] DOB: 1976-07-21 Referring Physician: Curt Bears (Profile Not Attached) Date of Service: 01/18/2023 [facility] [place]                                                        End Of Treatment Note  Diagnoses: C34.12-Malignant neoplasm of upper lobe, left bronchus or lung  Cancer Staging:  Stage IIb (T2b, N1, M0) non-small cell lung cancer, NOS of the LUL.  Intent: Curative  Radiation Treatment Dates: 12/05/2022 through 01/18/2023 Site Technique Total Dose (Gy) Dose per Fx (Gy) Completed Fx Beam Energies  Lung, Left: Lung_L 3D 60/60 2 30/30 6X, 10X  Lung, Left: Lung_L_Bst 3D 6/6 2 3/3 6X, 10X   Narrative: The patient tolerated radiation therapy relatively well. She developed fatigue and anticipated skin changes in the treatment field. She had esophagitis that did improve toward the conclusion of radiation.   Plan: The patient will receive a call in about one month from the radiation oncology department. She will continue follow up with Dr. Julien Nordmann as well.   ________________________________________________    Carola Rhine, [facility]

## 2023-02-07 ENCOUNTER — Other Ambulatory Visit: Payer: Self-pay

## 2023-02-09 ENCOUNTER — Ambulatory Visit (HOSPITAL_COMMUNITY)
Admission: RE | Admit: 2023-02-09 | Discharge: 2023-02-09 | Disposition: A | Discharge status: Home / Self Care | Payer: BC Managed Care – PPO | Source: Ambulatory Visit | Attending: Internal Medicine | Admitting: Internal Medicine

## 2023-02-09 ENCOUNTER — Encounter: Payer: Self-pay | Admitting: Internal Medicine

## 2023-02-09 ENCOUNTER — Inpatient Hospital Stay: Payer: BC Managed Care – PPO | Attending: Internal Medicine

## 2023-02-09 ENCOUNTER — Telehealth: Payer: Self-pay

## 2023-02-09 DIAGNOSIS — C3492 Malignant neoplasm of unspecified part of left bronchus or lung: Secondary | ICD-10-CM

## 2023-02-09 DIAGNOSIS — R000 Tachycardia, unspecified: Secondary | ICD-10-CM | POA: Insufficient documentation

## 2023-02-09 DIAGNOSIS — Z79899 Other long term (current) drug therapy: Secondary | ICD-10-CM | POA: Diagnosis not present

## 2023-02-09 DIAGNOSIS — E039 Hypothyroidism, unspecified: Secondary | ICD-10-CM | POA: Insufficient documentation

## 2023-02-09 DIAGNOSIS — C3490 Malignant neoplasm of unspecified part of unspecified bronchus or lung: Secondary | ICD-10-CM | POA: Insufficient documentation

## 2023-02-09 DIAGNOSIS — D6481 Anemia due to antineoplastic chemotherapy: Secondary | ICD-10-CM

## 2023-02-09 DIAGNOSIS — C3412 Malignant neoplasm of upper lobe, left bronchus or lung: Secondary | ICD-10-CM | POA: Diagnosis present

## 2023-02-09 LAB — CBC WITH DIFFERENTIAL (CANCER CENTER ONLY)
Abs Immature Granulocytes: 0.01 10*3/uL (ref 0.00–0.07)
Basophils Absolute: 0 10*3/uL (ref 0.0–0.1)
Basophils Relative: 0 %
Eosinophils Absolute: 0.1 10*3/uL (ref 0.0–0.5)
Eosinophils Relative: 3 %
HCT: 32.9 % — ABNORMAL LOW (ref 36.0–46.0)
Hemoglobin: 10 g/dL — ABNORMAL LOW (ref 12.0–15.0)
Immature Granulocytes: 0 %
Lymphocytes Relative: 21 %
Lymphs Abs: 0.8 10*3/uL (ref 0.7–4.0)
MCH: 26 pg (ref 26.0–34.0)
MCHC: 30.4 g/dL (ref 30.0–36.0)
MCV: 85.5 fL (ref 80.0–100.0)
Monocytes Absolute: 0.6 10*3/uL (ref 0.1–1.0)
Monocytes Relative: 15 %
Neutro Abs: 2.3 10*3/uL (ref 1.7–7.7)
Neutrophils Relative %: 61 %
Platelet Count: 276 10*3/uL (ref 150–400)
RBC: 3.85 MIL/uL — ABNORMAL LOW (ref 3.87–5.11)
RDW: 21.6 % — ABNORMAL HIGH (ref 11.5–15.5)
WBC Count: 3.8 10*3/uL — ABNORMAL LOW (ref 4.0–10.5)
nRBC: 0 % (ref 0.0–0.2)

## 2023-02-09 LAB — CMP (CANCER CENTER ONLY)
ALT: 17 U/L (ref 0–44)
AST: 19 U/L (ref 15–41)
Albumin: 4.1 g/dL (ref 3.5–5.0)
Alkaline Phosphatase: 71 U/L (ref 38–126)
Anion gap: 9 (ref 5–15)
BUN: 7 mg/dL (ref 6–20)
CO2: 25 mmol/L (ref 22–32)
Calcium: 10.2 mg/dL (ref 8.9–10.3)
Chloride: 105 mmol/L (ref 98–111)
Creatinine: 0.81 mg/dL (ref 0.44–1.00)
GFR, Estimated: >60 mL/min (ref >60)
Glucose, Bld: 116 mg/dL — ABNORMAL HIGH (ref 70–99)
Potassium: 3.5 mmol/L (ref 3.5–5.1)
Sodium: 139 mmol/L (ref 135–145)
Total Bilirubin: 0.3 mg/dL (ref 0.3–1.2)
Total Protein: 7.9 g/dL (ref 6.5–8.1)

## 2023-02-09 LAB — SAMPLE TO BLOOD BANK

## 2023-02-09 MED ORDER — IOHEXOL 300 MG/ML  SOLN
60.0000 mL | Freq: Once | INTRAMUSCULAR | Status: AC | PRN
  Administered 2023-02-09: 60 mL via INTRAVENOUS

## 2023-02-09 NOTE — Telephone Encounter (Signed)
CRITICAL VALUE STICKER  CRITICAL VALUE: HGB 10.1  RECEIVER (on-site recipient of call): Claud Gowan P. LPN  DATE & TIME NOTIFIED: 02/09/23 1037 am  MESSENGER (representative from lab): Pam  MD NOTIFIED: Cassie Heilingoetter, PA-C  TIME OF NOTIFICATION: 1040am

## 2023-02-10 ENCOUNTER — Encounter: Payer: Self-pay | Admitting: Internal Medicine

## 2023-02-13 ENCOUNTER — Inpatient Hospital Stay (HOSPITAL_BASED_OUTPATIENT_CLINIC_OR_DEPARTMENT_OTHER): Payer: BC Managed Care – PPO | Admitting: Internal Medicine

## 2023-02-13 VITALS — BP 115/82 | HR 123 | Temp 97.7°F | Resp 15 | Wt 99.6 lb

## 2023-02-13 DIAGNOSIS — C3490 Malignant neoplasm of unspecified part of unspecified bronchus or lung: Secondary | ICD-10-CM | POA: Diagnosis not present

## 2023-02-13 DIAGNOSIS — C3412 Malignant neoplasm of upper lobe, left bronchus or lung: Secondary | ICD-10-CM | POA: Diagnosis not present

## 2023-02-13 NOTE — Progress Notes (Signed)
[facility] Telephone:([phone]   Fax:([phone]  OFFICE PROGRESS NOTE  Darrow BussingKoirala, [place], MD [address] [place] [id number]  DIAGNOSIS: Stage IIb (T2b, N1, M0) non-small cell lung cancer with unknown subtype pending additional immunohistochemical stains of the lymph node biopsy diagnosed in January 2024 and presented with large left upper lobe lung mass in addition to left hilar adenopathy.    Detected Alteration(s) / Biomarker(s) Associated FDA-approved therapies Clinical Trial Availability % cfDNA or Amplification TP53 R213L None Yes 0.4%   PRIOR THERAPY: Concurrent chemoradiation with weekly carboplatin for AUC of 2 and paclitaxel 45 Mg/M2.  First dose December 05, 2022.  Status post 7 cycles.  Last dose was given in January 16, 2023 with partial response.   CURRENT THERAPY: Observation.   INTERVAL HISTORY: Elaine Gibson 47 y.o. female returns to the clinic today for follow-up visit.  The patient tolerated the previous course of concurrent chemoradiation fairly well except for fatigue.  She denied having any current chest pain, shortness of breath, cough or hemoptysis.  She has no nausea, vomiting, diarrhea or constipation.  She has no headache or visual changes.  She denied having any recent weight loss or night sweats.  She is here today for evaluation with repeat CT scan of the chest for restaging of her disease.  MEDICAL HISTORY: Past Medical History:  Diagnosis Date   Alpha-1-antitrypsin deficiency carrier    Dyspnea    Emphysema lung ([facility])    Emphysema of lung ([facility])    Hypothyroidism    tumors    Scoliosis     ALLERGIES:  is allergic to paclitaxel.  MEDICATIONS:  Current Outpatient Medications  Medication Sig Dispense Refill   albuterol (VENTOLIN HFA) 108 (90 Base) MCG/ACT inhaler Inhale 2 puffs into the lungs daily. 1 each 0   FeFum-FePoly-FA-B Cmp-C-Biot (INTEGRA PLUS) CAPS Take 1 capsule by mouth daily. 30  capsule 3   Fluticasone-Umeclidin-Vilant (TRELEGY ELLIPTA) 100-62.5-25 MCG/ACT AEPB Inhale 1 puff into the lungs daily. 60 each 0   omeprazole (PRILOSEC) 20 MG capsule Take 1 capsule (20 mg total) by mouth daily. 30 capsule 1   oxyCODONE-acetaminophen (PERCOCET/ROXICET) 5-325 MG tablet Take 1 tablet by mouth every 8 (eight) hours as needed for severe pain. 30 tablet 0   prochlorperazine (COMPAZINE) 10 MG tablet Take 1 tablet (10 mg total) by mouth every 6 (six) hours as needed. 30 tablet 2   sucralfate (CARAFATE) 1 g tablet Take 1 tablet (1 g total) by mouth 4 (four) times daily. Dissolve each tablet in 15 cc water before use. 120 tablet 2   No current facility-administered medications for this visit.    SURGICAL HISTORY:  Past Surgical History:  Procedure Laterality Date   BACK SURGERY     BRONCHIAL BIOPSY  11/15/2022   Procedure: BRONCHIAL BIOPSIES;  Surgeon: Josephine IgoIcard, Bradley L, DO;  Location: [facility];  Service: Pulmonary;;   BRONCHIAL NEEDLE ASPIRATION BIOPSY  11/15/2022   Procedure: BRONCHIAL NEEDLE ASPIRATION BIOPSIES;  Surgeon: Josephine IgoIcard, Bradley L, DO;  Location: [facility];  Service: Pulmonary;;   CESAREAN SECTION     FINE NEEDLE ASPIRATION  11/15/2022   Procedure: FINE NEEDLE ASPIRATION (FNA) LINEAR;  Surgeon: Josephine IgoIcard, Bradley L, DO;  Location: [facility];  Service: Pulmonary;;   VIDEO BRONCHOSCOPY WITH ENDOBRONCHIAL ULTRASOUND Bilateral 11/15/2022   Procedure: VIDEO BRONCHOSCOPY WITH ENDOBRONCHIAL ULTRASOUND;  Surgeon: Josephine IgoIcard, Bradley L, DO;  Location: [facility];  Service: Pulmonary;  Laterality: Bilateral;  REVIEW OF SYSTEMS:  Constitutional: positive for fatigue Eyes: negative Ears, nose, mouth, throat, and face: negative Respiratory: negative Cardiovascular: negative Gastrointestinal: negative Genitourinary:negative Integument/breast: negative Hematologic/lymphatic: negative Musculoskeletal:negative Neurological: negative Behavioral/Psych: negative Endocrine:  negative Allergic/Immunologic: negative   PHYSICAL EXAMINATION: General appearance: alert, cooperative, fatigued, and no distress Head: Normocephalic, without obvious abnormality, atraumatic Neck: no adenopathy, no JVD, supple, symmetrical, trachea midline, and thyroid not enlarged, symmetric, no tenderness/mass/nodules Lymph nodes: Cervical, supraclavicular, and axillary nodes normal. Resp: clear to auscultation bilaterally Back: symmetric, no curvature. ROM normal. No CVA tenderness. Cardio: regular rate and rhythm, S1, S2 normal, no murmur, click, rub or gallop GI: soft, non-tender; bowel sounds normal; no masses,  no organomegaly Extremities: extremities normal, atraumatic, no cyanosis or edema Neurologic: Alert and oriented X 3, normal strength and tone. Normal symmetric reflexes. Normal coordination and gait  ECOG PERFORMANCE STATUS: 1 - Symptomatic but completely ambulatory  Blood pressure 115/82, pulse (!) 123, temperature 97.7 F (36.5 C), temperature source Oral, resp. rate 15, weight 99 lb 9.6 oz (45.2 kg), SpO2 96 %.  LABORATORY DATA: Lab Results  Component Value Date   WBC 3.8 (L) 02/09/2023   HGB 10.0 (L) 02/09/2023   HCT 32.9 (L) 02/09/2023   MCV 85.5 02/09/2023   PLT 276 02/09/2023      Chemistry      Component Value Date/Time   NA 139 02/09/2023 0951   K 3.5 02/09/2023 0951   CL 105 02/09/2023 0951   CO2 25 02/09/2023 0951   BUN 7 02/09/2023 0951   CREATININE 0.81 02/09/2023 0951      Component Value Date/Time   CALCIUM 10.2 02/09/2023 0951   ALKPHOS 71 02/09/2023 0951   AST 19 02/09/2023 0951   ALT 17 02/09/2023 0951   BILITOT 0.3 02/09/2023 0951       RADIOGRAPHIC STUDIES: CT Chest W Contrast  Result Date: 02/09/2023 CLINICAL DATA:  47 year old female with history of non-small cell lung cancer. Follow-up study. * Tracking Code: BO * EXAM: CT CHEST WITH CONTRAST TECHNIQUE: Multidetector CT imaging of the chest was performed during intravenous  contrast administration. RADIATION DOSE REDUCTION: This exam was performed according to the departmental dose-optimization program which includes automated exposure control, adjustment of the mA and/or kV according to patient size and/or use of iterative reconstruction technique. CONTRAST:  74mL OMNIPAQUE IOHEXOL 300 MG/ML  SOLN COMPARISON:  PET-CT 11/04/2022. CT of the chest, abdomen and pelvis 10/31/2022. FINDINGS: Cardiovascular: Heart size is normal. There is no significant pericardial fluid, thickening or pericardial calcification. No atherosclerotic calcifications are noted in the thoracic aorta or the coronary arteries. Mediastinum/Nodes: Prominent nodal tissue in the right hilar region again noted measuring up to 6 mm in short axis, decreased compared to the prior study. No mediastinal or right hilar lymphadenopathy. Lungs/Pleura: Treated left upper lobe mass has substantially decreased in size, currently a 2.5 x 2.1 cm nodular area of architectural distortion with some surrounding ground-glass attenuation and septal thickening, which likely reflects evolving postradiation changes. No other new suspicious appearing pulmonary nodules or masses are noted. No acute consolidative airspace disease. No pleural effusions. Diffuse bronchial wall thickening with severe centrilobular and paraseptal emphysema, including extensive bullous disease throughout the lungs bilaterally (right greater than left). Upper Abdomen: Aortic atherosclerosis. Musculoskeletal: Orthopedic fixation rods throughout the thoracic spine. There are no aggressive appearing lytic or blastic lesions noted in the visualized portions of the skeleton. IMPRESSION: 1. Today's study demonstrates a positive response to therapy with partial regression of large left upper lobe  pulmonary nodule, and regression of left hilar lymphadenopathy. No new lesions are otherwise noted. 2. Diffuse bronchial wall thickening with severe bullous emphysema  redemonstrated, as above. Emphysema (ICD10-J43.9). Electronically Signed   By: Trudie Reed M.D.   On: 02/09/2023 12:52    ASSESSMENT AND PLAN: This is a very pleasant 47 years old African-American female with Stage IIb (T2b, N1, M0) non-small cell lung cancer with unknown subtype diagnosed in January 2024 and presented with large left upper lobe lung mass in addition to left hilar adenopathy.  Molecular studies by [employer] showed no actionable mutations The patient underwent a course of concurrent chemoradiation with weekly carboplatin for AUC of 2 and paclitaxel 45 Mg/M2.  She is status post 7 cycles.  She tolerated this treatment well except for fatigue. The patient had repeat CT scan of the chest performed recently.  I personally and independently reviewed the scan images and discussed the result and showed the images to the patient today. Her scan showed significant improvement of her disease with partial regression of the large left upper lobe pulmonary nodule and regression of the left hilar adenopathy and no new lesions. I recommended for the patient to continue on observation with repeat CT scan of the chest in 4 months. For the tachycardia, we will repeat an EKG today to rule out any underlying cardiac condition.  If it is consistent with sinus rhythm, she will be encouraged to increase her oral intake and monitor it closely with her primary care physician. The patient was advised to call immediately if she has any concerning symptoms in the interval. The patient voices understanding of current disease status and treatment options and is in agreement with the current care plan.  All questions were answered. The patient knows to call the clinic with any problems, questions or concerns. We can certainly see the patient much sooner if necessary.  The total time spent in the appointment was 30 minutes.  Disclaimer: This note was dictated with voice recognition software. Similar sounding  words can inadvertently be transcribed and may not be corrected upon review.

## 2023-03-06 ENCOUNTER — Ambulatory Visit
Admission: RE | Admit: 2023-03-06 | Discharge: 2023-03-06 | Disposition: A | Discharge status: Home / Self Care | Payer: BC Managed Care – PPO | Source: Ambulatory Visit | Attending: Nurse Practitioner | Admitting: Nurse Practitioner

## 2023-03-06 NOTE — Progress Notes (Signed)
  Radiation Oncology         [phone]) [phone] ________________________________  Name: Elaine Gibson MRN: [id number]  Date of Service: 03/06/2023  DOB: 16-Apr-1976  Post Treatment Telephone Note  Diagnosis:  Stage IIb (T2b, N1, M0) non-small cell lung cancer, NOS of the LUL.  Intent: Curative  Radiation Treatment Dates: 12/05/2022 through 01/18/2023 Site Technique Total Dose (Gy) Dose per Fx (Gy) Completed Fx Beam Energies  Lung, Left: Lung_L 3D 60/60 2 30/30 6X, 10X  Lung, Left: Lung_L_Bst 3D 6/6 2 3/3 6X, 10X   (as documented in provider EOT note)   The patient was available for call today.   Symptoms of fatigue have improved since completing therapy.  Symptoms of skin changes have improved since completing therapy.  Symptoms of esophagitis have improved since completing therapy.   The patient has scheduled follow up with her medical oncologist Dr. Arbutus Ped for ongoing care, and was encouraged to call if she develops concerns or questions regarding radiation.   This concludes the interview.   Ruel Favors, LPN

## 2023-03-16 ENCOUNTER — Telehealth: Payer: Self-pay

## 2023-03-16 NOTE — Telephone Encounter (Addendum)
[employer] 2021-05 - Specimen Collection Study to Evaluate Biomarkers in Subjects with Cancer   Spoke with patient to clarify smoking history. Smoked for 20 years, has now quit. Less than one PPD. Former Insurance underwriter.  Margret Chance Tabby Beaston, RN, BSN, [facility] She  Her  [employer] Clinical Research Nurse [facility] Direct Dial [phone]  Pager [phone] 03/16/2023 9:44 AM

## 2023-06-07 ENCOUNTER — Telehealth: Payer: Self-pay | Admitting: Internal Medicine

## 2023-06-07 NOTE — Telephone Encounter (Signed)
Called patient regarding upcoming August appointments, patient is notified. 

## 2023-06-12 ENCOUNTER — Ambulatory Visit (HOSPITAL_COMMUNITY)
Admission: RE | Admit: 2023-06-12 | Discharge: 2023-06-12 | Disposition: A | Discharge status: Home / Self Care | Payer: BC Managed Care – PPO | Source: Ambulatory Visit | Attending: Internal Medicine | Admitting: Internal Medicine

## 2023-06-12 ENCOUNTER — Inpatient Hospital Stay: Payer: BC Managed Care – PPO | Attending: Internal Medicine

## 2023-06-12 DIAGNOSIS — R634 Abnormal weight loss: Secondary | ICD-10-CM | POA: Diagnosis not present

## 2023-06-12 DIAGNOSIS — Z79899 Other long term (current) drug therapy: Secondary | ICD-10-CM | POA: Diagnosis not present

## 2023-06-12 DIAGNOSIS — J439 Emphysema, unspecified: Secondary | ICD-10-CM | POA: Diagnosis not present

## 2023-06-12 DIAGNOSIS — M419 Scoliosis, unspecified: Secondary | ICD-10-CM | POA: Diagnosis not present

## 2023-06-12 DIAGNOSIS — R590 Localized enlarged lymph nodes: Secondary | ICD-10-CM | POA: Diagnosis not present

## 2023-06-12 DIAGNOSIS — C3490 Malignant neoplasm of unspecified part of unspecified bronchus or lung: Secondary | ICD-10-CM | POA: Insufficient documentation

## 2023-06-12 DIAGNOSIS — J8410 Pulmonary fibrosis, unspecified: Secondary | ICD-10-CM | POA: Insufficient documentation

## 2023-06-12 DIAGNOSIS — C3412 Malignant neoplasm of upper lobe, left bronchus or lung: Secondary | ICD-10-CM | POA: Diagnosis present

## 2023-06-12 DIAGNOSIS — R5383 Other fatigue: Secondary | ICD-10-CM | POA: Diagnosis not present

## 2023-06-12 DIAGNOSIS — E042 Nontoxic multinodular goiter: Secondary | ICD-10-CM | POA: Diagnosis not present

## 2023-06-12 LAB — CBC WITH DIFFERENTIAL (CANCER CENTER ONLY)
Abs Immature Granulocytes: 0.01 10*3/uL (ref 0.00–0.07)
Basophils Absolute: 0.1 10*3/uL (ref 0.0–0.1)
Basophils Relative: 1 %
Eosinophils Absolute: 0.3 10*3/uL (ref 0.0–0.5)
Eosinophils Relative: 6 %
HCT: 34.8 % — ABNORMAL LOW (ref 36.0–46.0)
Hemoglobin: 11 g/dL — ABNORMAL LOW (ref 12.0–15.0)
Immature Granulocytes: 0 %
Lymphocytes Relative: 21 %
Lymphs Abs: 1.1 10*3/uL (ref 0.7–4.0)
MCH: 26.3 pg (ref 26.0–34.0)
MCHC: 31.6 g/dL (ref 30.0–36.0)
MCV: 83.1 fL (ref 80.0–100.0)
Monocytes Absolute: 0.5 10*3/uL (ref 0.1–1.0)
Monocytes Relative: 10 %
Neutro Abs: 3.2 10*3/uL (ref 1.7–7.7)
Neutrophils Relative %: 62 %
Platelet Count: 286 10*3/uL (ref 150–400)
RBC: 4.19 MIL/uL (ref 3.87–5.11)
RDW: 12.9 % (ref 11.5–15.5)
WBC Count: 5.3 10*3/uL (ref 4.0–10.5)
nRBC: 0 % (ref 0.0–0.2)

## 2023-06-12 LAB — CMP (CANCER CENTER ONLY)
ALT: 14 U/L (ref 0–44)
AST: 22 U/L (ref 15–41)
Albumin: 4.1 g/dL (ref 3.5–5.0)
Alkaline Phosphatase: 71 U/L (ref 38–126)
Anion gap: 4 — ABNORMAL LOW (ref 5–15)
BUN: 9 mg/dL (ref 6–20)
CO2: 27 mmol/L (ref 22–32)
Calcium: 9.2 mg/dL (ref 8.9–10.3)
Chloride: 104 mmol/L (ref 98–111)
Creatinine: 0.83 mg/dL (ref 0.44–1.00)
GFR, Estimated: >60 mL/min (ref >60)
Glucose, Bld: 90 mg/dL (ref 70–99)
Potassium: 4.1 mmol/L (ref 3.5–5.1)
Sodium: 135 mmol/L (ref 135–145)
Total Bilirubin: 0.4 mg/dL (ref 0.3–1.2)
Total Protein: 7.1 g/dL (ref 6.5–8.1)

## 2023-06-12 MED ORDER — SODIUM CHLORIDE (PF) 0.9 % IJ SOLN
INTRAMUSCULAR | Status: AC
  Filled 2023-06-12: qty 50

## 2023-06-12 MED ORDER — IOHEXOL 300 MG/ML  SOLN
75.0000 mL | Freq: Once | INTRAMUSCULAR | Status: AC | PRN
  Administered 2023-06-12: 75 mL via INTRAVENOUS

## 2023-06-14 ENCOUNTER — Inpatient Hospital Stay: Payer: BC Managed Care – PPO | Admitting: Internal Medicine

## 2023-06-19 ENCOUNTER — Other Ambulatory Visit: Payer: Self-pay

## 2023-06-19 ENCOUNTER — Inpatient Hospital Stay: Payer: BC Managed Care – PPO | Admitting: Internal Medicine

## 2023-06-19 VITALS — BP 104/74 | HR 87 | Temp 98.1°F | Resp 16 | Ht 65.0 in | Wt 97.9 lb

## 2023-06-19 DIAGNOSIS — C3490 Malignant neoplasm of unspecified part of unspecified bronchus or lung: Secondary | ICD-10-CM | POA: Diagnosis not present

## 2023-06-19 DIAGNOSIS — C3412 Malignant neoplasm of upper lobe, left bronchus or lung: Secondary | ICD-10-CM | POA: Diagnosis not present

## 2023-06-19 NOTE — Progress Notes (Signed)
[facility] Telephone:([phone]   Fax:(336) [phone]  OFFICE PROGRESS NOTE  Darrow Bussing, MD [address] [place] [address]  DIAGNOSIS: Stage IIb (T2b, N1, M0) non-small cell lung cancer with unknown subtype diagnosed in January 2024 and presented with large left upper lobe lung mass in addition to left hilar adenopathy.    Detected Alteration(s) / Biomarker(s) Associated FDA-approved therapies Clinical Trial Availability % cfDNA or Amplification TP53 R213L None Yes 0.4%   PRIOR THERAPY: Concurrent chemoradiation with weekly carboplatin for AUC of 2 and paclitaxel 45 Mg/M2.  First dose December 05, 2022.  Status post 7 cycles.  Last dose was given in January 16, 2023 with partial response.   CURRENT THERAPY: Observation.   INTERVAL HISTORY: Elaine Gibson 47 y.o. female returns to the clinic today for follow-up visit.  The patient is feeling fine today with no concerning complaints except for mild fatigue and few pounds of weight loss.  She denied having any current chest pain but has shortness of breath with exertion with no cough or hemoptysis.  She has no nausea, vomiting, diarrhea or constipation.  She has no headache or visual changes.  She has no recent fever or chills.  She had repeat CT scan of the chest performed recently and she is here for evaluation and discussion of her scan results.   MEDICAL HISTORY: Past Medical History:  Diagnosis Date   Alpha-1-antitrypsin deficiency carrier    Dyspnea    Emphysema lung (HCC)    Emphysema of lung (HCC)    Hypothyroidism    tumors    Scoliosis     ALLERGIES:  is allergic to paclitaxel.  MEDICATIONS:  Current Outpatient Medications  Medication Sig Dispense Refill   albuterol (VENTOLIN HFA) 108 (90 Base) MCG/ACT inhaler Inhale 2 puffs into the lungs daily. 1 each 0   FeFum-FePoly-FA-B Cmp-C-Biot (INTEGRA PLUS) CAPS Take 1 capsule by mouth daily. 30 capsule 3    Fluticasone-Umeclidin-Vilant (TRELEGY ELLIPTA) 100-62.5-25 MCG/ACT AEPB Inhale 1 puff into the lungs daily. 60 each 0   omeprazole (PRILOSEC) 20 MG capsule Take 1 capsule (20 mg total) by mouth daily. 30 capsule 1   oxyCODONE-acetaminophen (PERCOCET/ROXICET) 5-325 MG tablet Take 1 tablet by mouth every 8 (eight) hours as needed for severe pain. 30 tablet 0   No current facility-administered medications for this visit.    SURGICAL HISTORY:  Past Surgical History:  Procedure Laterality Date   BACK SURGERY     BRONCHIAL BIOPSY  11/15/2022   Procedure: BRONCHIAL BIOPSIES;  Surgeon: Josephine Igo, DO;  Location: [facility];  Service: Pulmonary;;   BRONCHIAL NEEDLE ASPIRATION BIOPSY  11/15/2022   Procedure: BRONCHIAL NEEDLE ASPIRATION BIOPSIES;  Surgeon: Josephine Igo, DO;  Location: [facility];  Service: Pulmonary;;   CESAREAN SECTION     FINE NEEDLE ASPIRATION  11/15/2022   Procedure: FINE NEEDLE ASPIRATION (FNA) LINEAR;  Surgeon: Josephine Igo, DO;  Location: [facility];  Service: Pulmonary;;   VIDEO BRONCHOSCOPY WITH ENDOBRONCHIAL ULTRASOUND Bilateral 11/15/2022   Procedure: VIDEO BRONCHOSCOPY WITH ENDOBRONCHIAL ULTRASOUND;  Surgeon: Josephine Igo, DO;  Location: [facility];  Service: Pulmonary;  Laterality: Bilateral;    REVIEW OF SYSTEMS:  A comprehensive review of systems was negative except for: Constitutional: positive for fatigue and weight loss Respiratory: positive for dyspnea on exertion   PHYSICAL EXAMINATION: General appearance: alert, cooperative, fatigued, and no distress Head: Normocephalic, without obvious abnormality, atraumatic Neck: no adenopathy, no JVD,  supple, symmetrical, trachea midline, and thyroid not enlarged, symmetric, no tenderness/mass/nodules Lymph nodes: Cervical, supraclavicular, and axillary nodes normal. Resp: clear to auscultation bilaterally Back: symmetric, no curvature. ROM normal. No CVA tenderness. Cardio: regular rate and rhythm, S1,  S2 normal, no murmur, click, rub or gallop GI: soft, non-tender; bowel sounds normal; no masses,  no organomegaly Extremities: extremities normal, atraumatic, no cyanosis or edema  ECOG PERFORMANCE STATUS: 1 - Symptomatic but completely ambulatory  Blood pressure 104/74, pulse 87, temperature 98.1 F (36.7 C), temperature source Oral, resp. rate 16, height 5\' 5"  (1.651 m), weight 97 lb 14.4 oz (44.4 kg), SpO2 100%.  LABORATORY DATA: Lab Results  Component Value Date   WBC 5.3 06/12/2023   HGB 11.0 (L) 06/12/2023   HCT 34.8 (L) 06/12/2023   MCV 83.1 06/12/2023   PLT 286 06/12/2023      Chemistry      Component Value Date/Time   NA 135 06/12/2023 1208   K 4.1 06/12/2023 1208   CL 104 06/12/2023 1208   CO2 27 06/12/2023 1208   BUN 9 06/12/2023 1208   CREATININE 0.83 06/12/2023 1208      Component Value Date/Time   CALCIUM 9.2 06/12/2023 1208   ALKPHOS 71 06/12/2023 1208   AST 22 06/12/2023 1208   ALT 14 06/12/2023 1208   BILITOT 0.4 06/12/2023 1208       RADIOGRAPHIC STUDIES: CT Chest W Contrast  Result Date: 06/16/2023 CLINICAL DATA:  Non-small cell lung cancer. Assess treatment response. * Tracking Code: BO * EXAM: CT CHEST WITH CONTRAST TECHNIQUE: Multidetector CT imaging of the chest was performed during intravenous contrast administration. RADIATION DOSE REDUCTION: This exam was performed according to the departmental dose-optimization program which includes automated exposure control, adjustment of the mA and/or kV according to patient size and/or use of iterative reconstruction technique. CONTRAST:  75mL OMNIPAQUE IOHEXOL 300 MG/ML  SOLN COMPARISON:  Chest CT 02/09/2023 and PET-CT 11/04/2022 FINDINGS: Cardiovascular: The heart is normal in size. No pericardial effusion. The aorta is normal in caliber. No dissection or atherosclerotic calcification. Scattered coronary artery calcifications. Mediastinum/Nodes: No mediastinal hilar mass or adenopathy. The esophagus is  grossly normal. Stable left thyroid goiter. Stable 2.5 cm left thyroid lobe lesion. This has been evaluated on previous imaging and previously biopsied. (Ref: J Am Coll Radiol. 2015 Feb;12(2): 143-50). Lungs/Pleura: Interval loss of volume in the left upper lobe with pleural thickening and airspace consolidation fibrosis. This could be radiation related. The medial left upper lobe lung mass is difficult to measure exactly appears stable when compared to the previous CT scan. Stable severe underlying emphysematous changes and pulmonary scarring. Patchy E ground-glass opacities noted in the left upper lobe medially and anteriorly likely areas of inflammation fibrosis. Attention on future scans is suggested. Stable compressive atelectasis and nodular scarring changes in the right upper lobe. No pleural effusions pleural lesions. Upper Abdomen: No significant upper abdominal findings. Age advanced vascular calcifications again noted. Musculoskeletal: Scoliosis and Harrington rods with significant artifact. No bone lesions. IMPRESSION: 1. Interval loss of volume in the left upper lobe with pleural thickening and airspace consolidation/ fibrosis. This is likely treatment related. 2. The medial left upper lobe lung mass is difficult to measure exactly but appears stable when compared to the previous CT scan. 3. Patchy ground-glass opacities in the left upper lobe medially and anteriorly likely areas of inflammation or fibrosis. 4. Recommend follow-up chest CT in 3-6 months. 5. No mediastinal or hilar mass or adenopathy. Emphysema (ICD10-J43.9). Electronically Signed  By: Rudie Meyer M.D.   On: 06/16/2023 08:45    ASSESSMENT AND PLAN: This is a very pleasant 47 years old African-American female with Stage IIb (T2b, N1, M0) non-small cell lung cancer with unknown subtype diagnosed in January 2024 and presented with large left upper lobe lung mass in addition to left hilar adenopathy.  Molecular studies by [employer]  showed no actionable mutations The patient underwent a course of concurrent chemoradiation with weekly carboplatin for AUC of 2 and paclitaxel 45 Mg/M2.  She is status post 7 cycles.  She tolerated this treatment well except for fatigue. The patient is currently on observation and she is feeling fine with no concerning complaints. She had repeat CT scan of the chest performed recently.  I personally and independently reviewed the scan and discussed the results with the patient today. Her scan showed no concerning findings for disease recurrence or metastasis. I recommended for her to continue on observation with repeat CT scan of the chest in 6 months. She was advised to call immediately if she has any other concerning symptoms in the interval. The patient voices understanding of current disease status and treatment options and is in agreement with the current care plan.  All questions were answered. The patient knows to call the clinic with any problems, questions or concerns. We can certainly see the patient much sooner if necessary.  The total time spent in the appointment was 20 minutes.  Disclaimer: This note was dictated with voice recognition software. Similar sounding words can inadvertently be transcribed and may not be corrected upon review.

## 2023-12-13 ENCOUNTER — Inpatient Hospital Stay: Payer: BC Managed Care – PPO | Attending: Internal Medicine

## 2023-12-13 ENCOUNTER — Ambulatory Visit (HOSPITAL_COMMUNITY)
Admission: RE | Admit: 2023-12-13 | Discharge: 2023-12-13 | Disposition: A | Discharge status: Home / Self Care | Payer: BC Managed Care – PPO | Source: Ambulatory Visit | Attending: Internal Medicine | Admitting: Internal Medicine

## 2023-12-13 DIAGNOSIS — C3490 Malignant neoplasm of unspecified part of unspecified bronchus or lung: Secondary | ICD-10-CM | POA: Insufficient documentation

## 2023-12-13 DIAGNOSIS — C3412 Malignant neoplasm of upper lobe, left bronchus or lung: Secondary | ICD-10-CM | POA: Insufficient documentation

## 2023-12-13 DIAGNOSIS — Z79899 Other long term (current) drug therapy: Secondary | ICD-10-CM | POA: Insufficient documentation

## 2023-12-13 LAB — CBC WITH DIFFERENTIAL (CANCER CENTER ONLY)
Abs Immature Granulocytes: 0.01 10*3/uL (ref 0.00–0.07)
Basophils Absolute: 0 10*3/uL (ref 0.0–0.1)
Basophils Relative: 1 %
Eosinophils Absolute: 0.2 10*3/uL (ref 0.0–0.5)
Eosinophils Relative: 5 %
HCT: 37.6 % (ref 36.0–46.0)
Hemoglobin: 11.4 g/dL — ABNORMAL LOW (ref 12.0–15.0)
Immature Granulocytes: 0 %
Lymphocytes Relative: 31 %
Lymphs Abs: 1.4 10*3/uL (ref 0.7–4.0)
MCH: 25.4 pg — ABNORMAL LOW (ref 26.0–34.0)
MCHC: 30.3 g/dL (ref 30.0–36.0)
MCV: 83.7 fL (ref 80.0–100.0)
Monocytes Absolute: 0.5 10*3/uL (ref 0.1–1.0)
Monocytes Relative: 11 %
Neutro Abs: 2.3 10*3/uL (ref 1.7–7.7)
Neutrophils Relative %: 52 %
Platelet Count: 237 10*3/uL (ref 150–400)
RBC: 4.49 MIL/uL (ref 3.87–5.11)
RDW: 12.2 % (ref 11.5–15.5)
WBC Count: 4.5 10*3/uL (ref 4.0–10.5)
nRBC: 0 % (ref 0.0–0.2)

## 2023-12-13 LAB — CMP (CANCER CENTER ONLY)
ALT: 9 U/L (ref 0–44)
AST: 18 U/L (ref 15–41)
Albumin: 4.2 g/dL (ref 3.5–5.0)
Alkaline Phosphatase: 66 U/L (ref 38–126)
Anion gap: 3 — ABNORMAL LOW (ref 5–15)
BUN: 12 mg/dL (ref 6–20)
CO2: 30 mmol/L (ref 22–32)
Calcium: 9.6 mg/dL (ref 8.9–10.3)
Chloride: 104 mmol/L (ref 98–111)
Creatinine: 0.84 mg/dL (ref 0.44–1.00)
GFR, Estimated: >60 mL/min (ref >60)
Glucose, Bld: 114 mg/dL — ABNORMAL HIGH (ref 70–99)
Potassium: 5.2 mmol/L — ABNORMAL HIGH (ref 3.5–5.1)
Sodium: 137 mmol/L (ref 135–145)
Total Bilirubin: 0.4 mg/dL (ref 0.0–1.2)
Total Protein: 7.4 g/dL (ref 6.5–8.1)

## 2023-12-13 MED ORDER — IOHEXOL 300 MG/ML  SOLN
75.0000 mL | Freq: Once | INTRAMUSCULAR | Status: AC | PRN
  Administered 2023-12-13: 75 mL via INTRAVENOUS

## 2023-12-20 ENCOUNTER — Inpatient Hospital Stay (HOSPITAL_BASED_OUTPATIENT_CLINIC_OR_DEPARTMENT_OTHER): Payer: BC Managed Care – PPO | Admitting: Internal Medicine

## 2023-12-20 VITALS — BP 108/67 | HR 107 | Temp 98.0°F | Resp 18 | Ht 65.0 in | Wt 87.0 lb

## 2023-12-20 DIAGNOSIS — C3490 Malignant neoplasm of unspecified part of unspecified bronchus or lung: Secondary | ICD-10-CM

## 2023-12-20 DIAGNOSIS — C3412 Malignant neoplasm of upper lobe, left bronchus or lung: Secondary | ICD-10-CM | POA: Diagnosis not present

## 2023-12-20 NOTE — Progress Notes (Signed)
[facility] Telephone:(336) [phone]   Fax:(336) [phone]  OFFICE PROGRESS NOTE  Darrow Bussing, MD [address] [place] [address]  DIAGNOSIS: Stage IIB  (T2b, N1, M0) non-small cell lung cancer with unknown subtype diagnosed in January 2024 and presented with large left upper lobe lung mass in addition to left hilar adenopathy.    Detected Alteration(s) / Biomarker(s) Associated FDA-approved therapies Clinical Trial Availability % cfDNA or Amplification TP53 R213L None Yes 0.4%   PRIOR THERAPY: Concurrent chemoradiation with weekly carboplatin for AUC of 2 and paclitaxel 45 Mg/M2.  First dose December 05, 2022.  Status post 7 cycles.  Last dose was given in January 16, 2023 with partial response.   CURRENT THERAPY: Observation.   INTERVAL HISTORY: Elaine Gibson 48 y.o. Gibson returns to the clinic today for follow-up visit. Discussed the use of AI scribe software for clinical note transcription with the patient, who gave verbal consent to proceed.  History of Present Illness   Elaine Gibson is a 48 year old Gibson with unresectable stage 2B cancer who presents for follow-up after treatment.  Diagnosed with unresectable stage 2B cancer in January 2024, she completed a course of chemotherapy and radiation with weekly carboplatin and paclitaxel by March 2024. She is currently under observation. A recent scan last week showed no signs of growth or spread of the cancer.  She experiences significant fatigue, which she attributes to her demanding work schedule. She works two jobs, starting at [employer] and finishing at [employer] the next day, from Sunday through Thursday. This schedule leaves her feeling 'drained' and affects her eating habits, as she is 'not eating like I should be'. She inquired about having her FMLA papers refilled due to the fatigue she experiences, which she finds challenging to manage with her current work schedule.        MEDICAL HISTORY: Past Medical History:  Diagnosis Date   Alpha-1-antitrypsin deficiency carrier    Dyspnea    Emphysema lung (HCC)    Emphysema of lung (HCC)    Hypothyroidism    tumors    Scoliosis     ALLERGIES:  is allergic to paclitaxel.  MEDICATIONS:  Current Outpatient Medications  Medication Sig Dispense Refill   albuterol (VENTOLIN HFA) 108 (90 Base) MCG/ACT inhaler Inhale 2 puffs into the lungs daily. 1 each 0   FeFum-FePoly-FA-B Cmp-C-Biot (INTEGRA PLUS) CAPS Take 1 capsule by mouth daily. 30 capsule 3   Fluticasone-Umeclidin-Vilant (TRELEGY ELLIPTA) 100-62.5-25 MCG/ACT AEPB Inhale 1 puff into the lungs daily. 60 each 0   omeprazole (PRILOSEC) 20 MG capsule Take 1 capsule (20 mg total) by mouth daily. 30 capsule 1   No current facility-administered medications for this visit.    SURGICAL HISTORY:  Past Surgical History:  Procedure Laterality Date   BACK SURGERY     BRONCHIAL BIOPSY  11/15/2022   Procedure: BRONCHIAL BIOPSIES;  Surgeon: Josephine Igo, DO;  Location: [facility];  Service: Pulmonary;;   BRONCHIAL NEEDLE ASPIRATION BIOPSY  11/15/2022   Procedure: BRONCHIAL NEEDLE ASPIRATION BIOPSIES;  Surgeon: Josephine Igo, DO;  Location: [facility];  Service: Pulmonary;;   CESAREAN SECTION     FINE NEEDLE ASPIRATION  11/15/2022   Procedure: FINE NEEDLE ASPIRATION (FNA) LINEAR;  Surgeon: Josephine Igo, DO;  Location: [facility];  Service: Pulmonary;;   VIDEO BRONCHOSCOPY WITH ENDOBRONCHIAL ULTRASOUND Bilateral 11/15/2022   Procedure: VIDEO BRONCHOSCOPY WITH ENDOBRONCHIAL ULTRASOUND;  Surgeon: Josephine Igo,  DO;  Location: [facility];  Service: Pulmonary;  Laterality: Bilateral;    REVIEW OF SYSTEMS:  A comprehensive review of systems was negative except for: Constitutional: positive for fatigue   PHYSICAL EXAMINATION: General appearance: alert, cooperative, fatigued, and no distress Head: Normocephalic, without obvious abnormality, atraumatic Neck:  no adenopathy, no JVD, supple, symmetrical, trachea midline, and thyroid not enlarged, symmetric, no tenderness/mass/nodules Lymph nodes: Cervical, supraclavicular, and axillary nodes normal. Resp: clear to auscultation bilaterally Back: symmetric, no curvature. ROM normal. No CVA tenderness. Cardio: regular rate and rhythm, S1, S2 normal, no murmur, click, rub or gallop GI: soft, non-tender; bowel sounds normal; no masses,  no organomegaly Extremities: extremities normal, atraumatic, no cyanosis or edema  ECOG PERFORMANCE STATUS: 1 - Symptomatic but completely ambulatory  Blood pressure 108/67, pulse (!) 107, temperature 98 F (36.7 C), temperature source Tympanic, resp. rate 18, height 5\' 5"  (1.651 m), weight 87 lb (39.5 kg), SpO2 95%.  LABORATORY DATA: Lab Results  Component Value Date   WBC 4.5 12/13/2023   HGB 11.4 (L) 12/13/2023   HCT 37.6 12/13/2023   MCV 83.7 12/13/2023   PLT 237 12/13/2023      Chemistry      Component Value Date/Time   NA 137 12/13/2023 1446   K 5.2 (H) 12/13/2023 1446   CL 104 12/13/2023 1446   CO2 30 12/13/2023 1446   BUN 12 12/13/2023 1446   CREATININE 0.84 12/13/2023 1446      Component Value Date/Time   CALCIUM 9.6 12/13/2023 1446   ALKPHOS 66 12/13/2023 1446   AST 18 12/13/2023 1446   ALT 9 12/13/2023 1446   BILITOT 0.4 12/13/2023 1446       RADIOGRAPHIC STUDIES: CT Chest W Contrast Result Date: 12/20/2023 CLINICAL DATA:  Non-small cell lung cancer restaging * Tracking Code: BO * EXAM: CT CHEST WITH CONTRAST TECHNIQUE: Multidetector CT imaging of the chest was performed during intravenous contrast administration. RADIATION DOSE REDUCTION: This exam was performed according to the departmental dose-optimization program which includes automated exposure control, adjustment of the mA and/or kV according to patient size and/or use of iterative reconstruction technique. CONTRAST:  75mL OMNIPAQUE IOHEXOL 300 MG/ML  SOLN COMPARISON:  06/12/2023  FINDINGS: Cardiovascular: Small pericardial effusion primarily inferiorly, mildly increased from prior. Mediastinum/Nodes: Solid 2.8 cm left thyroid lobe nodule on image 27 series 2. This was worked up on prior fine-needle aspiration of 03/27/2019. This has been evaluated on previous imaging. (ref: J Am Coll Radiol. 2015 Feb;12(2): 143-50). No further workup of the thyroid nodule is currently indicated. Lungs/Pleura: Severe emphysema, large bulla especially in the right upper lobe. Stable consolidation apically in the left upper lobe, likely therapy related. Faintly increased interstitial accentuation anteriorly in the left upper lobe for example on image 50 series 6, likely inflammatory or therapy related. Upper Abdomen: Unremarkable Musculoskeletal: Harrington rods noted in the thoracic spine, with mild dextroconvex thoracic scoliosis. IMPRESSION: 1. Stable consolidation apically in the left upper lobe,, compatible with treated region of previous left medial apical lung mass. No alteration in morphology to suggest active growth. 2. Faintly increased interstitial accentuation anteriorly in the left upper lobe, likely inflammatory or therapy related. 3. Severe emphysema, large bulla especially in the right upper lobe. Emphysema (ICD10-J43.9). 4. Small pericardial effusion primarily inferiorly, mildly increased from prior. Electronically Signed   By: Gaylyn Rong M.D.   On: 12/20/2023 10:40    ASSESSMENT AND PLAN: This is a very pleasant 48 years old African-American Gibson with Stage IIb (T2b, N1,  M0) non-small cell lung cancer with unknown subtype diagnosed in January 2024 and presented with large left upper lobe lung mass in addition to left hilar adenopathy.  Molecular studies by [email or web address] showed no actionable mutations The patient underwent a course of concurrent chemoradiation with weekly carboplatin for AUC of 2 and paclitaxel 45 Mg/M2.  She is status post 7 cycles.  She tolerated this treatment  well except for fatigue. She is currently on observation and feeling fine except for the fatigue from working 2 jobs. She had repeat CT scan of the chest performed recently.  I personally and independently reviewed the scan and discussed the result with the patient today. Her scan showed no concerning findings for disease recurrence or metastasis.    Unresectable Stage IIB Lung Cancer Diagnosed in January 2024. Completed chemotherapy and radiation with weekly carboplatin and paclitaxel in March 2024. Currently under observation. Recent scan shows no growth or metastasis. Lab work is unremarkable. Reports significant fatigue, likely exacerbated by working two jobs with night shifts. - Continue observation - Schedule next scan in six months - Perform lab work and scan one week before the next appointment  Fatigue Significant fatigue likely related to working two jobs with night shifts. Discussed the potential impact of her work schedule on fatigue. Advised that FMLA paperwork may not be beneficial given the current six-month follow-up schedule. - Discuss fatigue management with primary care physician for potential FMLA support.   The patient was advised to call immediately if she has any concerning symptoms in the interval. The patient voices understanding of current disease status and treatment options and is in agreement with the current care plan.  All questions were answered. The patient knows to call the clinic with any problems, questions or concerns. We can certainly see the patient much sooner if necessary.  The total time spent in the appointment was 20 minutes.  Disclaimer: This note was dictated with voice recognition software. Similar sounding words can inadvertently be transcribed and may not be corrected upon review.

## 2024-01-23 ENCOUNTER — Telehealth: Payer: Self-pay | Admitting: *Deleted

## 2024-01-23 NOTE — Telephone Encounter (Signed)
 Patient requested information about FMLA process. Advised her ppwk can be brought to [facility] at any time and she will need to fill out check in /routing form for all ppwk.  Also gave phone numbers for FMLA/Disability specialists. Se verbalized understanding of all information.

## 2024-03-11 ENCOUNTER — Emergency Department (HOSPITAL_COMMUNITY)

## 2024-03-11 ENCOUNTER — Other Ambulatory Visit: Payer: Self-pay

## 2024-03-11 ENCOUNTER — Inpatient Hospital Stay (HOSPITAL_COMMUNITY)
Admission: EM | Admit: 2024-03-11 | Discharge: 2024-03-12 | DRG: 299 | Disposition: A | Discharge status: Home / Self Care | Attending: Internal Medicine | Admitting: Internal Medicine

## 2024-03-11 ENCOUNTER — Encounter (HOSPITAL_COMMUNITY): Payer: Self-pay

## 2024-03-11 DIAGNOSIS — R0602 Shortness of breath: Principal | ICD-10-CM

## 2024-03-11 DIAGNOSIS — Z888 Allergy status to other drugs, medicaments and biological substances status: Secondary | ICD-10-CM

## 2024-03-11 DIAGNOSIS — J441 Chronic obstructive pulmonary disease with (acute) exacerbation: Principal | ICD-10-CM

## 2024-03-11 DIAGNOSIS — Z923 Personal history of irradiation: Secondary | ICD-10-CM | POA: Diagnosis not present

## 2024-03-11 DIAGNOSIS — E039 Hypothyroidism, unspecified: Secondary | ICD-10-CM

## 2024-03-11 DIAGNOSIS — Z87891 Personal history of nicotine dependence: Secondary | ICD-10-CM | POA: Diagnosis not present

## 2024-03-11 DIAGNOSIS — Z9221 Personal history of antineoplastic chemotherapy: Secondary | ICD-10-CM | POA: Diagnosis not present

## 2024-03-11 DIAGNOSIS — Z79899 Other long term (current) drug therapy: Secondary | ICD-10-CM

## 2024-03-11 DIAGNOSIS — R000 Tachycardia, unspecified: Secondary | ICD-10-CM | POA: Diagnosis present

## 2024-03-11 DIAGNOSIS — T17500A Unspecified foreign body in bronchus causing asphyxiation, initial encounter: Secondary | ICD-10-CM

## 2024-03-11 DIAGNOSIS — I2609 Other pulmonary embolism with acute cor pulmonale: Secondary | ICD-10-CM | POA: Diagnosis not present

## 2024-03-11 DIAGNOSIS — T17590A Other foreign object in bronchus causing asphyxiation, initial encounter: Secondary | ICD-10-CM | POA: Diagnosis present

## 2024-03-11 DIAGNOSIS — M419 Scoliosis, unspecified: Secondary | ICD-10-CM | POA: Diagnosis present

## 2024-03-11 DIAGNOSIS — I2693 Single subsegmental pulmonary embolism without acute cor pulmonale: Secondary | ICD-10-CM

## 2024-03-11 DIAGNOSIS — Z841 Family history of disorders of kidney and ureter: Secondary | ICD-10-CM | POA: Diagnosis not present

## 2024-03-11 DIAGNOSIS — G9340 Encephalopathy, unspecified: Secondary | ICD-10-CM

## 2024-03-11 DIAGNOSIS — Z833 Family history of diabetes mellitus: Secondary | ICD-10-CM

## 2024-03-11 DIAGNOSIS — Z8249 Family history of ischemic heart disease and other diseases of the circulatory system: Secondary | ICD-10-CM | POA: Diagnosis not present

## 2024-03-11 DIAGNOSIS — T8172XA Complication of vein following a procedure, not elsewhere classified, initial encounter: Principal | ICD-10-CM | POA: Diagnosis present

## 2024-03-11 DIAGNOSIS — Z800 Family history of malignant neoplasm of digestive organs: Secondary | ICD-10-CM | POA: Diagnosis not present

## 2024-03-11 DIAGNOSIS — J439 Emphysema, unspecified: Secondary | ICD-10-CM | POA: Diagnosis present

## 2024-03-11 DIAGNOSIS — G9341 Metabolic encephalopathy: Secondary | ICD-10-CM | POA: Diagnosis present

## 2024-03-11 DIAGNOSIS — Z85118 Personal history of other malignant neoplasm of bronchus and lung: Secondary | ICD-10-CM

## 2024-03-11 DIAGNOSIS — I272 Pulmonary hypertension, unspecified: Secondary | ICD-10-CM

## 2024-03-11 LAB — BASIC METABOLIC PANEL WITH GFR
Anion gap: 10 (ref 5–15)
BUN: 9 mg/dL (ref 6–20)
CO2: 27 mmol/L (ref 22–32)
Calcium: 9.5 mg/dL (ref 8.9–10.3)
Chloride: 100 mmol/L (ref 98–111)
Creatinine, Ser: 0.79 mg/dL (ref 0.44–1.00)
GFR, Estimated: >60 mL/min (ref >60)
Glucose, Bld: 81 mg/dL (ref 70–99)
Potassium: 4.1 mmol/L (ref 3.5–5.1)
Sodium: 137 mmol/L (ref 135–145)

## 2024-03-11 LAB — CBC
HCT: 43.1 % (ref 36.0–46.0)
Hemoglobin: 13 g/dL (ref 12.0–15.0)
MCH: 26.2 pg (ref 26.0–34.0)
MCHC: 30.2 g/dL (ref 30.0–36.0)
MCV: 86.9 fL (ref 80.0–100.0)
Platelets: 212 10*3/uL (ref 150–400)
RBC: 4.96 MIL/uL (ref 3.87–5.11)
RDW: 12.4 % (ref 11.5–15.5)
WBC: 4.2 10*3/uL (ref 4.0–10.5)
nRBC: 0 % (ref 0.0–0.2)

## 2024-03-11 LAB — TROPONIN I (HIGH SENSITIVITY)
Troponin I (High Sensitivity): 4 ng/L (ref <18)
Troponin I (High Sensitivity): 3 ng/L (ref <18)

## 2024-03-11 LAB — MAGNESIUM: Magnesium: 1.7 mg/dL (ref 1.7–2.4)

## 2024-03-11 LAB — HCG, SERUM, QUALITATIVE: Preg, Serum: NEGATIVE

## 2024-03-11 MED ORDER — MELATONIN 3 MG PO TABS: 3.0000 mg | ORAL_TABLET | Freq: Every evening | ORAL | Status: DC | PRN

## 2024-03-11 MED ORDER — IOHEXOL 350 MG/ML SOLN
75.0000 mL | Freq: Once | INTRAVENOUS | Status: AC | PRN
  Administered 2024-03-11: 75 mL via INTRAVENOUS

## 2024-03-11 MED ORDER — ACETAMINOPHEN 650 MG RE SUPP: 650.0000 mg | Freq: Four times a day (QID) | RECTAL | Status: DC | PRN

## 2024-03-11 MED ORDER — METHYLPREDNISOLONE SODIUM SUCC 125 MG IJ SOLR
125.0000 mg | Freq: Once | INTRAMUSCULAR | Status: AC
  Administered 2024-03-11: 125 mg via INTRAVENOUS
  Filled 2024-03-11: qty 2

## 2024-03-11 MED ORDER — ONDANSETRON HCL 4 MG/2ML IJ SOLN
4.0000 mg | Freq: Four times a day (QID) | INTRAMUSCULAR | Status: DC | PRN
  Administered 2024-03-11: 4 mg via INTRAVENOUS
  Filled 2024-03-11: qty 2

## 2024-03-11 MED ORDER — SODIUM CHLORIDE 0.9 % IV BOLUS
1000.0000 mL | Freq: Once | INTRAVENOUS | Status: AC
  Administered 2024-03-11: 1000 mL via INTRAVENOUS

## 2024-03-11 MED ORDER — SODIUM CHLORIDE 0.9 % IV SOLN
500.0000 mg | INTRAVENOUS | Status: DC
  Administered 2024-03-11: 500 mg via INTRAVENOUS
  Filled 2024-03-11 (×2): qty 5

## 2024-03-11 MED ORDER — METHYLPREDNISOLONE SODIUM SUCC 125 MG IJ SOLR
80.0000 mg | Freq: Two times a day (BID) | INTRAMUSCULAR | Status: DC
  Administered 2024-03-12: 80 mg via INTRAVENOUS
  Filled 2024-03-11: qty 2

## 2024-03-11 MED ORDER — SODIUM CHLORIDE (PF) 0.9 % IJ SOLN
INTRAMUSCULAR | Status: AC
  Filled 2024-03-11: qty 50

## 2024-03-11 MED ORDER — IPRATROPIUM-ALBUTEROL 0.5-2.5 (3) MG/3ML IN SOLN
3.0000 mL | Freq: Once | RESPIRATORY_TRACT | Status: AC
  Administered 2024-03-11: 3 mL via RESPIRATORY_TRACT
  Filled 2024-03-11: qty 3

## 2024-03-11 MED ORDER — ENOXAPARIN SODIUM 40 MG/0.4ML IJ SOSY
40.0000 mg | PREFILLED_SYRINGE | Freq: Two times a day (BID) | INTRAMUSCULAR | Status: DC
  Administered 2024-03-12: 40 mg via SUBCUTANEOUS
  Filled 2024-03-11: qty 0.4

## 2024-03-11 MED ORDER — IPRATROPIUM-ALBUTEROL 0.5-2.5 (3) MG/3ML IN SOLN
3.0000 mL | Freq: Four times a day (QID) | RESPIRATORY_TRACT | Status: DC
  Administered 2024-03-11 – 2024-03-12 (×3): 3 mL via RESPIRATORY_TRACT
  Filled 2024-03-11 (×3): qty 3

## 2024-03-11 MED ORDER — ALBUTEROL SULFATE (2.5 MG/3ML) 0.083% IN NEBU: 2.5000 mg | INHALATION_SOLUTION | RESPIRATORY_TRACT | Status: DC | PRN

## 2024-03-11 MED ORDER — ACETAMINOPHEN 325 MG PO TABS
650.0000 mg | ORAL_TABLET | Freq: Four times a day (QID) | ORAL | Status: DC | PRN
  Administered 2024-03-12 (×2): 650 mg via ORAL
  Filled 2024-03-11 (×2): qty 2

## 2024-03-11 MED ORDER — ENOXAPARIN SODIUM 40 MG/0.4ML IJ SOSY
40.0000 mg | PREFILLED_SYRINGE | INTRAMUSCULAR | Status: AC
  Administered 2024-03-11: 40 mg via SUBCUTANEOUS
  Filled 2024-03-11: qty 0.4

## 2024-03-11 NOTE — ED Notes (Signed)
 Pt ambulatory with steady gait to bathroom

## 2024-03-11 NOTE — H&P (Signed)
 History and Physical      Elaine Gibson WNU:[id number] DOB: Feb 06, 1976 DOA: 03/11/2024; DOS: 03/11/2024  PCP: Lanae Pinal, MD *** Patient coming from: home ***  I have personally briefly reviewed patient's old medical records in [facility]  Chief Complaint: ***  HPI: Elaine Gibson is a 48 y.o. female with medical history significant for *** who is admitted to [facility] on 03/11/2024 with *** after presenting from home*** to [facility] ED complaining of ***.   ***        ***  ED Course:  Vital signs in the ED were notable for the following: ***  Labs were notable for the following: ***  Per my interpretation, EKG in ED demonstrated the following:  ***  Imaging in the ED, per corresponding formal radiology read, was notable for the following: ***  While in the ED, the following were administered: ***  Subsequently, the patient was admitted  ***  ***red   Review of Systems: As per HPI otherwise 10 point review of systems negative.   Past Medical History:  Diagnosis Date   Alpha-1-antitrypsin deficiency carrier    Dyspnea    Emphysema lung (HCC)    Emphysema of lung (HCC)    Hypothyroidism    tumors    Scoliosis     Past Surgical History:  Procedure Laterality Date   BACK SURGERY     BRONCHIAL BIOPSY  11/15/2022   Procedure: BRONCHIAL BIOPSIES;  Surgeon: Prudy Brownie, DO;  Location: [facility] ENDOSCOPY;  Service: Pulmonary;;   BRONCHIAL NEEDLE ASPIRATION BIOPSY  11/15/2022   Procedure: BRONCHIAL NEEDLE ASPIRATION BIOPSIES;  Surgeon: Prudy Brownie, DO;  Location: [facility] ENDOSCOPY;  Service: Pulmonary;;   CESAREAN SECTION     FINE NEEDLE ASPIRATION  11/15/2022   Procedure: FINE NEEDLE ASPIRATION (FNA) LINEAR;  Surgeon: Prudy Brownie, DO;  Location: [facility] ENDOSCOPY;  Service: Pulmonary;;   VIDEO BRONCHOSCOPY WITH ENDOBRONCHIAL ULTRASOUND Bilateral 11/15/2022   Procedure: VIDEO BRONCHOSCOPY WITH ENDOBRONCHIAL ULTRASOUND;  Surgeon: Prudy Brownie,  DO;  Location: [facility] ENDOSCOPY;  Service: Pulmonary;  Laterality: Bilateral;    Social History:  reports that she quit smoking about 4 years ago. Her smoking use included cigars. She started smoking about 38 years ago. She has never used smokeless tobacco. She reports that she does not drink alcohol and does not use drugs.   Allergies  Allergen Reactions   Paclitaxel  Other (See Comments)    Patient had a hypersensitivity reaction on 2/19. See progress note on 12/26/22.    Family History  Problem Relation Age of Onset   Hypertension Mother    Kidney disease Mother    Stomach cancer Father    Gestational diabetes Sister     Family history reviewed and not pertinent ***   Prior to Admission medications   Medication Sig Start Date End Date Taking? Authorizing Provider  albuterol  (VENTOLIN  HFA) 108 (90 Base) MCG/ACT inhaler Inhale 2 puffs into the lungs daily. 11/16/22   Prudy Brownie, DO  FeFum-FePoly-FA-B Cmp-C-Biot (INTEGRA PLUS ) CAPS Take 1 capsule by mouth daily. 12/05/22   Marlene Simas, MD  Fluticasone-Umeclidin-Vilant (TRELEGY ELLIPTA ) 100-62.5-25 MCG/ACT AEPB Inhale 1 puff into the lungs daily. 11/16/22   Icard, Lucie Ruts, DO  omeprazole  (PRILOSEC) 20 MG capsule Take 1 capsule (20 mg total) by mouth daily. 12/05/22   Marlene Simas, MD     Objective    Physical Exam: Vitals:   03/11/24 1137 03/11/24 1528 03/11/24 1716 03/11/24 1901  BP:  90/69 (!) 85/69 95/67  Pulse:  (!) 103 94 88  Resp:  15 16 16   Temp:  97.8 F (36.6 C) 98.8 F (37.1 C) 98 F (36.7 C)  TempSrc:  Oral Oral Oral  SpO2:  96% 96% 95%  Weight: 40.4 kg     Height: 5\' 5"  (1.651 m)       General: appears to be stated age; alert, oriented Skin: warm, dry, no rash Head:  AT/ Mouth:  Oral mucosa membranes appear moist, normal dentition Neck: supple; trachea midline Heart:  RRR; did not appreciate any M/R/G Lungs: CTAB, did not appreciate any wheezes, rales, or rhonchi Abdomen: + BS; soft, ND,  NT Vascular: 2+ pedal pulses b/l; 2+ radial pulses b/l Extremities: no peripheral edema, no muscle wasting Neuro: strength and sensation intact in upper and lower extremities b/l    *** Neuro: 5/5 strength of the proximal and distal flexors and extensors of the upper and lower extremities bilaterally; sensation intact in upper and lower extremities b/l; cranial nerves II through XII grossly intact; no pronator drift; no evidence suggestive of slurred speech, dysarthria, or facial droop; Normal muscle tone. No tremors. *** Neuro: In the setting of the patient's current mental status and associated inability to follow instructions, unable to perform full neurologic exam at this time.  As such, assessment of strength, sensation, and cranial nerves is limited at this time. Patient noted to spontaneously move all 4 extremities. No tremors.  ***    Labs on Admission: I have personally reviewed following labs and imaging studies  CBC: Recent Labs  Lab 03/11/24 1153  WBC 4.2  HGB 13.0  HCT 43.1  MCV 86.9  PLT 212   Basic Metabolic Panel: Recent Labs  Lab 03/11/24 1153  NA 137  K 4.1  CL 100  CO2 27  GLUCOSE 81  BUN 9  CREATININE 0.79  CALCIUM 9.5   GFR: Estimated Creatinine Clearance: 54.8 mL/min (by C-G formula based on SCr of 0.79 mg/dL). Liver Function Tests: No results for input(s): "AST", "ALT", "ALKPHOS", "BILITOT", "PROT", "ALBUMIN" in the last 168 hours. No results for input(s): "LIPASE", "AMYLASE" in the last 168 hours. No results for input(s): "AMMONIA" in the last 168 hours. Coagulation Profile: No results for input(s): "INR", "PROTIME" in the last 168 hours. Cardiac Enzymes: No results for input(s): "CKTOTAL", "CKMB", "CKMBINDEX", "TROPONINI" in the last 168 hours. BNP (last 3 results) No results for input(s): "PROBNP" in the last 8760 hours. HbA1C: No results for input(s): "HGBA1C" in the last 72 hours. CBG: No results for input(s): "GLUCAP" in the last 168  hours. Lipid Profile: No results for input(s): "CHOL", "HDL", "LDLCALC", "TRIG", "CHOLHDL", "LDLDIRECT" in the last 72 hours. Thyroid  Function Tests: No results for input(s): "TSH", "T4TOTAL", "FREET4", "T3FREE", "THYROIDAB" in the last 72 hours. Anemia Panel: No results for input(s): "VITAMINB12", "FOLATE", "FERRITIN", "TIBC", "IRON", "RETICCTPCT" in the last 72 hours. Urine analysis:    Component Value Date/Time   COLORURINE YELLOW 10/31/2022 1102   APPEARANCEUR CLEAR 10/31/2022 1102   LABSPEC >1.030 (H) 10/31/2022 1102   PHURINE 5.5 10/31/2022 1102   GLUCOSEU NEGATIVE 10/31/2022 1102   HGBUR TRACE (A) 10/31/2022 1102   BILIRUBINUR SMALL (A) 10/31/2022 1102   KETONESUR NEGATIVE 10/31/2022 1102   PROTEINUR 100 (A) 10/31/2022 1102   NITRITE NEGATIVE 10/31/2022 1102   LEUKOCYTESUR NEGATIVE 10/31/2022 1102    Radiological Exams on Admission: CT Angio Chest PE W and/or Wo Contrast Result Date: 03/11/2024 CLINICAL DATA:  Pulmonary embolism (PE) suspected,  high prob, history of lung cancer. EXAM: CT ANGIOGRAPHY CHEST WITH CONTRAST TECHNIQUE: Multidetector CT imaging of the chest was performed using the standard protocol during bolus administration of intravenous contrast. Multiplanar CT image reconstructions and MIPs were obtained to evaluate the vascular anatomy. RADIATION DOSE REDUCTION: This exam was performed according to the departmental dose-optimization program which includes automated exposure control, adjustment of the mA and/or kV according to patient size and/or use of iterative reconstruction technique. CONTRAST:  75mL OMNIPAQUE  IOHEXOL  350 MG/ML SOLN COMPARISON:  December 13, 2023 FINDINGS: Pulmonary Embolism: The segmental pulmonary artery to the posterior left lung apex is thrombosed (axial 65-59). Cardiovascular: No cardiomegaly. Unchanged small pericardial effusion. No aortic aneurysm. Mediastinum/Nodes: No mediastinal mass. No mediastinal, hilar, or axillary lymphadenopathy.  Unchanged, partially visualized solid left thyroid  lobe nodule. Lungs/Pleura: Midline trachea is patent. Diffuse bronchial wall thickening with areas of mucous plugging. Advanced destructive emphysema with large bullous change filling the entire right upper lobe. Similar ground-glass airspace opacities within the left upper lobe with areas of pleuroparenchymal scarring in the medial left lung apex, likely post treatment changes. Fibrolinear scarring or subsegmental atelectasis within both lower lobes anteriorly, the right middle lobe, and lingula. No pleural effusion or pneumothorax. Musculoskeletal: No acute fracture or destructive bone lesion. Mild dextrocurvature of the thoracic spine. Bilateral Harrington fusion rods. Upper Abdomen: No acute abnormality in the partially visualized upper abdomen. Review of the MIP images confirms the above findings. IMPRESSION: 1. The segmental pulmonary artery to the posterior left lung apex is thrombosed (axial 65-59). While this is consistent with a pulmonary embolus, it may be secondary to post radiation changes from the left upper lobe neoplasm. No additional pulmonary emboli visualized or findings of right heart strain present. 2. Similar changes of advanced destructive and bullous emphysema. Multifocal mucous plugging within the right lower lobe. No superimposed pneumonia. Critical Value/emergent results were called by telephone at the time of interpretation on 03/11/2024 at 7:19 pm to provider JOSHUA LONG , who verbally acknowledged these results. Electronically Signed   By: Rance Burrows M.D.   On: 03/11/2024 19:29   DG Chest 2 View Result Date: 03/11/2024 CLINICAL DATA:  Chest pain. EXAM: CHEST - 2 VIEW COMPARISON:  Chest radiograph dated 11/15/2022. FINDINGS: Severe emphysema. No focal consolidation, pleural effusion, pneumothorax. The cardiac silhouette is within limits. Osteopenia. Spinal Harrington rods. No acute osseous pathology. IMPRESSION: 1. No active  cardiopulmonary disease. 2. Severe emphysema. Electronically Signed   By: Angus Bark M.D.   On: 03/11/2024 12:27      Assessment/Plan    Principal Problem:   Acute exacerbation of chronic obstructive pulmonary disease (COPD) (HCC)  ***        ***                  ***                  ***                  ***                  ***                 ***                  ***                  ***                  ***                  ***                  ***                  ***                 ***  DVT prophylaxis: SCD's ***  Code Status: Full code*** Family Communication: none*** Disposition Plan: Per Rounding Team Consults called: none***;  Admission status: ***    I SPENT GREATER THAN 75 *** MINUTES IN CLINICAL CARE TIME/MEDICAL DECISION-MAKING IN COMPLETING THIS ADMISSION.     Gattis Kass Leonell Lobdell DO [facility] From 7PM - 7AM   03/11/2024, 8:11 PM   ***

## 2024-03-11 NOTE — ED Triage Notes (Signed)
 Mid sternal chest pain that started 3-4 days ago with sob that is worse with exertion. Intermittent left arm "numbness". Pt has lung cancer, finished chemo April 2024. Pt is in remission.

## 2024-03-11 NOTE — ED Provider Notes (Signed)
 Emergency Department Provider Note   I have reviewed the triage vital signs and the nursing notes.   HISTORY  Chief Complaint Chest Pain   HPI Elaine Gibson is a 48 y.o. female with past history of emphysema and prior lung cancer history and now off chemo for the past 13 months presents to the emergency department with shortness of breath and chest discomfort.  She had some associated URI symptoms with cough.  Her chest pain is mainly with coughing but feels significant dyspnea, especially on exertion.  She does have some occasional tingling in the left arm but no weakness.  No leg symptoms.  No neck pain.  No current numbness symptoms. Has been using her albuterol  at home without relief.    Past Medical History:  Diagnosis Date   Alpha-1-antitrypsin deficiency carrier    Dyspnea    Emphysema lung (HCC)    Emphysema of lung (HCC)    Hypothyroidism    tumors    Scoliosis     Review of Systems  Constitutional: No fever/chills Cardiovascular: Positive chest pain. Respiratory:Positive shortness of breath. Gastrointestinal: No abdominal pain.  No nausea, no vomiting.  Musculoskeletal: Negative for back pain. Skin: Negative for rash. Neurological: Negative for headaches.  ____________________________________________   PHYSICAL EXAM:  VITAL SIGNS: ED Triage Vitals  Encounter Vitals Group     BP 03/11/24 1134 99/69     Pulse Rate 03/11/24 1134 (!) 104     Resp 03/11/24 1134 16     Temp 03/11/24 1134 98.6 F (37 C)     Temp Source 03/11/24 1134 Oral     SpO2 03/11/24 1134 97 %     Weight 03/11/24 1137 89 lb (40.4 kg)     Height 03/11/24 1137 5\' 5"  (1.651 m)   Constitutional: Alert and oriented. Well appearing and in no acute distress. Eyes: Conjunctivae are normal. Head: Atraumatic. Nose: No congestion/rhinnorhea. Mouth/Throat: Mucous membranes are moist.   Neck: No stridor.   Cardiovascular: Tachycardia. Good peripheral circulation. Grossly normal heart  sounds.   Respiratory: Normal respiratory effort.  No retractions. Lungs with mild end expiratory wheezing and frequent bronchospastic cough.  Gastrointestinal: Soft and nontender. No distention.  Musculoskeletal: No gross deformities of extremities. Neurologic:  Normal speech and language. Skin:  Skin is warm, dry and intact. No rash noted.  ____________________________________________   LABS (all labs ordered are listed, but only abnormal results are displayed)  Labs Reviewed  CBC WITH DIFFERENTIAL/PLATELET - Abnormal; Notable for the following components:      Result Value   WBC 3.9 (*)    Hemoglobin 11.6 (*)    MCH 25.6 (*)    MCHC 28.3 (*)    Lymphs Abs 0.3 (*)    All other components within normal limits  COMPREHENSIVE METABOLIC PANEL WITH GFR - Abnormal; Notable for the following components:   Glucose, Bld 143 (*)    Calcium 8.7 (*)    All other components within normal limits  MAGNESIUM - Abnormal; Notable for the following components:   Magnesium 1.6 (*)    All other components within normal limits  BLOOD GAS, VENOUS - Abnormal; Notable for the following components:   pO2, Ven <31 (*)    All other components within normal limits  URINALYSIS, COMPLETE (UACMP) WITH MICROSCOPIC - Abnormal; Notable for the following components:   APPearance HAZY (*)    Glucose, UA 50 (*)    Hgb urine dipstick SMALL (*)    Ketones, ur 5 (*)  All other components within normal limits  RAPID URINE DRUG SCREEN, HOSP PERFORMED - Abnormal; Notable for the following components:   Tetrahydrocannabinol POSITIVE (*)    All other components within normal limits  TSH - Abnormal; Notable for the following components:   TSH 0.221 (*)    All other components within normal limits  MRSA NEXT GEN BY PCR, NASAL  SARS CORONAVIRUS 2 BY RT PCR  BASIC METABOLIC PANEL WITH GFR  CBC  HCG, SERUM, QUALITATIVE  MAGNESIUM  PHOSPHORUS  PROCALCITONIN  BRAIN NATRIURETIC PEPTIDE  VITAMIN B12  TROPONIN I  (HIGH SENSITIVITY)  TROPONIN I (HIGH SENSITIVITY)   ____________________________________________  EKG   EKG Interpretation Date/Time:  Monday Mar 11 2024 11:44:21 EDT Ventricular Rate:  102 PR Interval:  123 QRS Duration:  77 QT Interval:  353 QTC Calculation: 460 R Axis:   90  Text Interpretation: Sinus tachycardia Consider right atrial enlargement Anteroseptal infarct, age indeterminate No STEMI Confirmed by Abby Hocking [phone]) on 03/11/2024 3:48:12 PM        ____________________________________________  RADIOLOGY  DG Chest 2 View Result Date: 03/11/2024 CLINICAL DATA:  Chest pain. EXAM: CHEST - 2 VIEW COMPARISON:  Chest radiograph dated 11/15/2022. FINDINGS: Severe emphysema. No focal consolidation, pleural effusion, pneumothorax. The cardiac silhouette is within limits. Osteopenia. Spinal Harrington rods. No acute osseous pathology. IMPRESSION: 1. No active cardiopulmonary disease. 2. Severe emphysema. Electronically Signed   By: Angus Bark M.D.   On: 03/11/2024 12:27    ____________________________________________   PROCEDURES  Procedure(s) performed:   Procedures  None  ____________________________________________   INITIAL IMPRESSION / ASSESSMENT AND PLAN / ED COURSE  Pertinent labs & imaging results that were available during my care of the patient were reviewed by me and considered in my medical decision making (see chart for details).   This patient is Presenting for Evaluation of CP, which does require a range of treatment options, and is a complaint that involves a high risk of morbidity and mortality.  The Differential Diagnoses includes but is not exclusive to acute coronary syndrome, aortic dissection, pulmonary embolism, cardiac tamponade, community-acquired pneumonia, pericarditis, musculoskeletal chest wall pain, etc.   Critical Interventions-    Medications  ipratropium-albuterol  (DUONEB) 0.5-2.5 (3) MG/3ML nebulizer solution 3 mL (3 mLs  Nebulization Given 03/11/24 1620)  iohexol  (OMNIPAQUE ) 350 MG/ML injection 75 mL (75 mLs Intravenous Contrast Given 03/11/24 1747)  methylPREDNISolone  sodium succinate (SOLU-MEDROL ) 125 mg/2 mL injection 125 mg (125 mg Intravenous Given 03/11/24 1952)  ipratropium-albuterol  (DUONEB) 0.5-2.5 (3) MG/3ML nebulizer solution 3 mL (3 mLs Nebulization Given 03/11/24 1955)  enoxaparin (LOVENOX) injection 40 mg (40 mg Subcutaneous Given 03/11/24 2013)  sodium chloride  0.9 % bolus 1,000 mL (0 mLs Intravenous Stopped 03/11/24 2226)    Reassessment after intervention: Symptoms improved.    I decided to review pertinent External Data, and in summary patient followed by Dr. Marguerita Shih with Oncology.   Clinical Laboratory Tests Ordered, included Troponin negative. BMP without AKI. No anemia. Pregnancy negative.   Radiologic Tests Ordered, included CXR and CTA PE. I independently interpreted the images and agree with radiology interpretation.   Cardiac Monitor Tracing which shows tachycardia.   Social Determinants of Health Risk patient is a non-smoker.   Consult complete with [facility]. Plan for admit.   Medical Decision Making: Summary:  Patient presents emergency department for evaluation of chest pain with cough and shortness of breath.  Mild wheezing and bronchospastic cough on exam.  Plan for DuoNeb and reassess but given cancer history also  plan for CTA PE with elevated risk for this by [place].  Reevaluation with update and discussion with patient.  She does have a tiny peripheral thrombus, likely radiation related but technically a PE.  I do not think this is contributing significantly to her symptoms.  Plan for admit for additional nebulizer treatments and monitoring.   Patient's presentation is most consistent with acute presentation with potential threat to life or bodily function.   Disposition: admit  ____________________________________________  FINAL CLINICAL IMPRESSION(S) / ED DIAGNOSES  Final  diagnoses:  SOB (shortness of breath)  Single subsegmental pulmonary embolism without acute cor pulmonale (HCC)     NEW OUTPATIENT MEDICATIONS STARTED DURING THIS VISIT:  Discharge Medication List as of 03/12/2024  7:08 PM     START taking these medications   Details  APIXABAN (ELIQUIS) VTE STARTER PACK (10MG  AND 5MG ) Take as directed on package: start with two-5mg  tablets twice daily for 7 days. On day 8, switch to one-5mg  tablet twice daily., Normal    predniSONE  (DELTASONE ) 20 MG tablet Take 2 tablets (40 mg total) by mouth daily with breakfast for 3 days., Starting Wed 03/13/2024, Until Sat 03/16/2024, Normal    albuterol  (PROVENTIL ) (2.5 MG/3ML) 0.083% nebulizer solution Take 3 mLs (2.5 mg total) by nebulization every 4 (four) hours as needed for wheezing or shortness of breath., Starting Tue 03/12/2024, Normal    guaiFENesin  (MUCINEX ) 600 MG 12 hr tablet Take 2 tablets (1,200 mg total) by mouth 2 (two) times daily., Starting Tue 03/12/2024, Normal    ipratropium-albuterol  (DUONEB) 0.5-2.5 (3) MG/3ML SOLN Take 3 mLs by nebulization 3 (three) times daily., Starting Tue 03/12/2024, Normal    sodium chloride  HYPERTONIC 3 % nebulizer solution Take 4 mLs by nebulization 2 (two) times daily for 2 days., Starting Tue 03/12/2024, Until Thu 03/14/2024, Normal        Note:  This document was prepared using Dragon voice recognition software and may include unintentional dictation errors.  Abby Hocking, MD, [facility] Emergency Medicine    Levonia Wolfley, Shereen Dike, MD 03/13/24 [phone]

## 2024-03-11 NOTE — Progress Notes (Signed)
 PHARMACY - ANTICOAGULATION CONSULT NOTE  Pharmacy Consult for Lovenox Indication: pulmonary embolus  Allergies  Allergen Reactions   Paclitaxel  Other (See Comments)    Patient had a hypersensitivity reaction on 2/19. See progress note on 12/26/22.    Patient Measurements: Height: 5\' 5"  (165.1 cm) Weight: 40.4 kg (89 lb) IBW/kg (Calculated) : 57 HEPARIN  DW (KG): 40.4  Vital Signs: Temp: 98 F (36.7 C) (05/05 1901) Temp Source: Oral (05/05 1901) BP: 95/67 (05/05 1901) Pulse Rate: 88 (05/05 1901)  Labs: Recent Labs    03/11/24 1153 03/11/24 1620  HGB 13.0  --   HCT 43.1  --   PLT 212  --   CREATININE 0.79  --   TROPONINIHS 4 3    Estimated Creatinine Clearance: 54.8 mL/min (by C-G formula based on SCr of 0.79 mg/dL).   Medical History: Past Medical History:  Diagnosis Date   Alpha-1-antitrypsin deficiency carrier    Dyspnea    Emphysema lung (HCC)    Emphysema of lung (HCC)    Hypothyroidism    tumors    Scoliosis      Assessment: Active Problem(s): SOB, CP  5/5 CT: The segmental pulmonary artery to the posterior left lung apex is thrombosed (axial 65-59). While this is consistent with a pulmonary embolus, it may be secondary to post radiation changes from the left upper lobe neoplasm.   AC/Heme: Lovenox for PE. Baseline CBC WNL  Goal of Therapy:  Anti-Xa level 0.6-1 units/ml 4hrs after LMWH dose given Monitor platelets by anticoagulation protocol: Yes   Plan:  Lovenox 40mg  SQ BID CBC q72h while on LMWH   Naryah Clenney S. Zannie Hey, PharmD, BCPS Clinical Staff Pharmacist Enis Harsh Stillinger 03/11/2024,7:53 PM

## 2024-03-12 ENCOUNTER — Encounter (HOSPITAL_COMMUNITY): Payer: Self-pay | Admitting: Internal Medicine

## 2024-03-12 ENCOUNTER — Other Ambulatory Visit (HOSPITAL_COMMUNITY): Payer: Self-pay

## 2024-03-12 ENCOUNTER — Inpatient Hospital Stay (HOSPITAL_COMMUNITY)

## 2024-03-12 DIAGNOSIS — G9340 Encephalopathy, unspecified: Secondary | ICD-10-CM | POA: Diagnosis present

## 2024-03-12 DIAGNOSIS — I2609 Other pulmonary embolism with acute cor pulmonale: Secondary | ICD-10-CM | POA: Diagnosis not present

## 2024-03-12 DIAGNOSIS — J441 Chronic obstructive pulmonary disease with (acute) exacerbation: Secondary | ICD-10-CM | POA: Diagnosis not present

## 2024-03-12 DIAGNOSIS — I2693 Single subsegmental pulmonary embolism without acute cor pulmonale: Secondary | ICD-10-CM | POA: Diagnosis present

## 2024-03-12 DIAGNOSIS — R0602 Shortness of breath: Principal | ICD-10-CM | POA: Diagnosis present

## 2024-03-12 DIAGNOSIS — E039 Hypothyroidism, unspecified: Secondary | ICD-10-CM | POA: Diagnosis present

## 2024-03-12 DIAGNOSIS — T17500A Unspecified foreign body in bronchus causing asphyxiation, initial encounter: Secondary | ICD-10-CM | POA: Diagnosis present

## 2024-03-12 LAB — ECHOCARDIOGRAM COMPLETE
Area-P 1/2: 5.88 cm2
Height: 65 in
S' Lateral: 2.4 cm
Weight: 1442.69 [oz_av]

## 2024-03-12 LAB — URINALYSIS, COMPLETE (UACMP) WITH MICROSCOPIC
Bacteria, UA: NONE SEEN
Bilirubin Urine: NEGATIVE
Glucose, UA: 50 mg/dL — AB
Ketones, ur: 5 mg/dL — AB
Leukocytes,Ua: NEGATIVE
Nitrite: NEGATIVE
Protein, ur: NEGATIVE mg/dL
Specific Gravity, Urine: 1.029 (ref 1.005–1.030)
pH: 5 (ref 5.0–8.0)

## 2024-03-12 LAB — PROCALCITONIN: Procalcitonin: <0.1 ng/mL

## 2024-03-12 LAB — COMPREHENSIVE METABOLIC PANEL WITH GFR
ALT: 19 U/L (ref 0–44)
AST: 28 U/L (ref 15–41)
Albumin: 3.6 g/dL (ref 3.5–5.0)
Alkaline Phosphatase: 55 U/L (ref 38–126)
Anion gap: 10 (ref 5–15)
BUN: 11 mg/dL (ref 6–20)
CO2: 22 mmol/L (ref 22–32)
Calcium: 8.7 mg/dL — ABNORMAL LOW (ref 8.9–10.3)
Chloride: 104 mmol/L (ref 98–111)
Creatinine, Ser: 0.83 mg/dL (ref 0.44–1.00)
GFR, Estimated: >60 mL/min (ref >60)
Glucose, Bld: 143 mg/dL — ABNORMAL HIGH (ref 70–99)
Potassium: 3.6 mmol/L (ref 3.5–5.1)
Sodium: 136 mmol/L (ref 135–145)
Total Bilirubin: 0.7 mg/dL (ref 0.0–1.2)
Total Protein: 6.6 g/dL (ref 6.5–8.1)

## 2024-03-12 LAB — RAPID URINE DRUG SCREEN, HOSP PERFORMED
Amphetamines: NOT DETECTED
Barbiturates: NOT DETECTED
Benzodiazepines: NOT DETECTED
Cocaine: NOT DETECTED
Opiates: NOT DETECTED
Tetrahydrocannabinol: POSITIVE — AB

## 2024-03-12 LAB — CBC WITH DIFFERENTIAL/PLATELET
Abs Immature Granulocytes: 0.01 10*3/uL (ref 0.00–0.07)
Basophils Absolute: 0 10*3/uL (ref 0.0–0.1)
Basophils Relative: 0 %
Eosinophils Absolute: 0 10*3/uL (ref 0.0–0.5)
Eosinophils Relative: 0 %
HCT: 41 % (ref 36.0–46.0)
Hemoglobin: 11.6 g/dL — ABNORMAL LOW (ref 12.0–15.0)
Immature Granulocytes: 0 %
Lymphocytes Relative: 9 %
Lymphs Abs: 0.3 10*3/uL — ABNORMAL LOW (ref 0.7–4.0)
MCH: 25.6 pg — ABNORMAL LOW (ref 26.0–34.0)
MCHC: 28.3 g/dL — ABNORMAL LOW (ref 30.0–36.0)
MCV: 90.3 fL (ref 80.0–100.0)
Monocytes Absolute: 0.1 10*3/uL (ref 0.1–1.0)
Monocytes Relative: 1 %
Neutro Abs: 3.5 10*3/uL (ref 1.7–7.7)
Neutrophils Relative %: 90 %
Platelets: 182 10*3/uL (ref 150–400)
RBC: 4.54 MIL/uL (ref 3.87–5.11)
RDW: 12.4 % (ref 11.5–15.5)
WBC: 3.9 10*3/uL — ABNORMAL LOW (ref 4.0–10.5)
nRBC: 0 % (ref 0.0–0.2)

## 2024-03-12 LAB — BRAIN NATRIURETIC PEPTIDE: B Natriuretic Peptide: 70.3 pg/mL (ref 0.0–100.0)

## 2024-03-12 LAB — MAGNESIUM: Magnesium: 1.6 mg/dL — ABNORMAL LOW (ref 1.7–2.4)

## 2024-03-12 LAB — BLOOD GAS, VENOUS
Acid-base deficit: 1.6 mmol/L (ref 0.0–2.0)
Bicarbonate: 25.8 mmol/L (ref 20.0–28.0)
Drawn by: 6807
O2 Saturation: 44.2 %
Patient temperature: 37.1
pCO2, Ven: 55 mmHg (ref 44–60)
pH, Ven: 7.28 (ref 7.25–7.43)
pO2, Ven: <31 mmHg — CL (ref 32–45)

## 2024-03-12 LAB — PHOSPHORUS: Phosphorus: 3.7 mg/dL (ref 2.5–4.6)

## 2024-03-12 LAB — MRSA NEXT GEN BY PCR, NASAL: MRSA by PCR Next Gen: NOT DETECTED

## 2024-03-12 LAB — VITAMIN B12: Vitamin B-12: 480 pg/mL (ref 180–914)

## 2024-03-12 LAB — TSH: TSH: 0.221 u[IU]/mL — ABNORMAL LOW (ref 0.350–4.500)

## 2024-03-12 MED ORDER — IPRATROPIUM-ALBUTEROL 0.5-2.5 (3) MG/3ML IN SOLN
3.0000 mL | Freq: Three times a day (TID) | RESPIRATORY_TRACT | Status: DC
  Administered 2024-03-12: 3 mL via RESPIRATORY_TRACT
  Filled 2024-03-12 (×2): qty 3

## 2024-03-12 MED ORDER — ALBUTEROL SULFATE HFA 108 (90 BASE) MCG/ACT IN AERS: 2.0000 | INHALATION_SPRAY | RESPIRATORY_TRACT | Status: DC | PRN

## 2024-03-12 MED ORDER — ALBUTEROL SULFATE HFA 108 (90 BASE) MCG/ACT IN AERS
2.0000 | INHALATION_SPRAY | RESPIRATORY_TRACT | 1 refills | Status: AC | PRN
  Filled 2024-03-12: qty 18, 30d supply, fill #0

## 2024-03-12 MED ORDER — IPRATROPIUM-ALBUTEROL 0.5-2.5 (3) MG/3ML IN SOLN
3.0000 mL | Freq: Three times a day (TID) | RESPIRATORY_TRACT | 0 refills | Status: AC
  Filled 2024-03-12: qty 270, 30d supply, fill #0

## 2024-03-12 MED ORDER — ALBUTEROL SULFATE HFA 108 (90 BASE) MCG/ACT IN AERS
2.0000 | INHALATION_SPRAY | RESPIRATORY_TRACT | Status: DC | PRN
  Administered 2024-03-12: 2 via RESPIRATORY_TRACT
  Filled 2024-03-12 (×2): qty 6.7

## 2024-03-12 MED ORDER — APIXABAN (ELIQUIS) VTE STARTER PACK (10MG AND 5MG)
ORAL_TABLET | ORAL | 0 refills | Status: DC
  Filled 2024-03-12: qty 74, 30d supply, fill #0

## 2024-03-12 MED ORDER — PREDNISONE 20 MG PO TABS
40.0000 mg | ORAL_TABLET | Freq: Every day | ORAL | 0 refills | Status: AC
  Filled 2024-03-12: qty 6, 3d supply, fill #0

## 2024-03-12 MED ORDER — ALBUTEROL SULFATE (2.5 MG/3ML) 0.083% IN NEBU
2.5000 mg | INHALATION_SOLUTION | RESPIRATORY_TRACT | 12 refills | Status: AC | PRN
  Filled 2024-03-12: qty 75, 5d supply, fill #0

## 2024-03-12 MED ORDER — GUAIFENESIN ER 600 MG PO TB12
1200.0000 mg | ORAL_TABLET | Freq: Two times a day (BID) | ORAL | Status: DC
  Administered 2024-03-12: 1200 mg via ORAL
  Filled 2024-03-12: qty 2

## 2024-03-12 MED ORDER — SODIUM CHLORIDE 3 % IN NEBU
4.0000 mL | INHALATION_SOLUTION | Freq: Two times a day (BID) | RESPIRATORY_TRACT | Status: DC
  Administered 2024-03-12: 4 mL via RESPIRATORY_TRACT
  Filled 2024-03-12 (×2): qty 4

## 2024-03-12 MED ORDER — SODIUM CHLORIDE 3 % IN NEBU
4.0000 mL | INHALATION_SOLUTION | Freq: Two times a day (BID) | RESPIRATORY_TRACT | 0 refills | Status: AC
  Filled 2024-03-12: qty 15, 2d supply, fill #0
  Filled 2024-04-22 (×2): qty 750, 13d supply, fill #0

## 2024-03-12 MED ORDER — ALBUTEROL SULFATE (2.5 MG/3ML) 0.083% IN NEBU: 2.5000 mg | INHALATION_SOLUTION | RESPIRATORY_TRACT | Status: DC | PRN

## 2024-03-12 MED ORDER — TRELEGY ELLIPTA 100-62.5-25 MCG/ACT IN AEPB
1.0000 | INHALATION_SPRAY | Freq: Every day | RESPIRATORY_TRACT | 0 refills | Status: DC
  Filled 2024-03-12: qty 60, 30d supply, fill #0

## 2024-03-12 MED ORDER — GUAIFENESIN ER 600 MG PO TB12
1200.0000 mg | ORAL_TABLET | Freq: Two times a day (BID) | ORAL | 0 refills | Status: AC
  Filled 2024-03-12: qty 80, 20d supply, fill #0
  Filled 2024-04-22 (×2): qty 60, 15d supply, fill #0
  Filled 2024-04-30: qty 60, 15d supply, fill #1

## 2024-03-12 MED ORDER — CHLORHEXIDINE GLUCONATE CLOTH 2 % EX PADS: 6.0000 | MEDICATED_PAD | Freq: Every day | CUTANEOUS | Status: DC

## 2024-03-12 NOTE — Progress Notes (Signed)
  Echocardiogram 2D Echocardiogram has been performed.  Fain Home RDCS 03/12/2024, 3:53 PM

## 2024-03-12 NOTE — Hospital Course (Signed)
 48yo with h/o lung CA s/p radiation who presented on 5/5 with SOB, found to have COPD exacerbation as well as a small acute PE (thought to be from radiation thrombosis).  Started on Lovenox, steroids, nebs.

## 2024-03-12 NOTE — Progress Notes (Signed)
Rt gave pt flutter valve. Pt knows and understands how to use. 

## 2024-03-12 NOTE — Care Management (Addendum)
 Received secure chat from bedside RN that MD has added DME: home nebulizer. Per chart review patient has received home portable oxygen tank for discharge, as RN as confirmed. This RNCM spoke with Adriana Hopping with [employer] after hours team who reports they are just an answering service and unable to take the DME order. Mary request this RNCM contact them in the am to get the nebulizer to patient. This RNCM also sent message to Jermaine with [employer] which is after hours for him as well. Unable to secure nebulizer until in the am to have delivered to patient's home address on file.

## 2024-03-12 NOTE — TOC Transition Note (Signed)
 Transition of Care [facility]) - Discharge Note   Patient Details  Name: Elaine Gibson MRN: [id number] Date of Birth: [birth year]  Transition of Care [facility]) CM/SW Contact:  Amaryllis Junior, LCSW Phone Number: 03/12/2024, 1:14 PM   Clinical Narrative:    Pt medically ready to dc home. Pt has new O2 requirements. O2 ordered to be delivered to room through [facility] DME. No further TOC needs.    Final next level of care: Home/Self Care Barriers to Discharge: Continued Medical Work up   Patient Goals and CMS Choice Patient states their goals for this hospitalization and ongoing recovery are:: return home CMS Medicare.gov Compare Post Acute Care list provided to::  (NA) Choice offered to / list presented to : NA [place] ownership interest in [facility].provided to::  (NA)    Discharge Placement                       Discharge Plan and Services Additional resources added to the After Visit Summary for   In-house Referral: NA              DME Arranged: Oxygen DME Agency: [employer] Date DME Agency Contacted: 03/12/24 Time DME Agency Contacted: 1313 Representative spoke with at DME Agency: April HH Arranged: NA HH Agency: NA        Social Drivers of Health (SDOH) Interventions SDOH Screenings   Food Insecurity: No Food Insecurity (03/12/2024)  Housing: Unknown (03/12/2024)  Transportation Needs: No Transportation Needs (03/12/2024)  Utilities: Not At Risk (03/12/2024)  Tobacco Use: Medium Risk (03/12/2024)     Readmission Risk Interventions    03/12/2024   11:51 AM  Readmission Risk Prevention Plan  Transportation Screening Complete  PCP or Specialist Appt within 5-7 Days Complete  Home Care Screening Complete  Medication Review (RN CM) Complete

## 2024-03-12 NOTE — Plan of Care (Signed)
  Problem: Clinical Measurements: Goal: Respiratory complications will improve Outcome: Progressing Goal: Cardiovascular complication will be avoided Outcome: Progressing   Problem: Activity: Goal: Risk for activity intolerance will decrease Outcome: Progressing   Problem: Nutrition: Goal: Adequate nutrition will be maintained Outcome: Progressing   Problem: Coping: Goal: Level of anxiety will decrease Outcome: Progressing   Problem: Elimination: Goal: Will not experience complications related to urinary retention Outcome: Progressing   Problem: Pain Managment: Goal: General experience of comfort will improve and/or be controlled Outcome: Progressing   Problem: Safety: Goal: Ability to remain free from injury will improve Outcome: Progressing

## 2024-03-12 NOTE — Progress Notes (Addendum)
 SATURATION QUALIFICATIONS: (This note is used to comply with regulatory documentation for home oxygen)  Patient Saturations on Room Air at Rest = 90%  Patient Saturations on Room Air while Ambulating = 85%   Please briefly explain why patient needs home oxygen: Patient's oxygen saturations dropped down to 85% on room air while ambulating.

## 2024-03-12 NOTE — Discharge Summary (Addendum)
 Physician Discharge Summary   Patient: Elaine Gibson MRN: [id number] DOB: 1976/02/25  Admit date:     03/11/2024  Discharge date: 03/12/24  Discharge Physician: Lorita Rosa   PCP: Lanae Pinal, MD   Recommendations at discharge:   Take prednisone  daily to complete 5 total days of steroids (through 5/9) You are being discharged with home oxygen Follow up with Dr. Candance Certain in 1-2 weeks for hospital follow up and to review your echocardiogram You will need lifelong Eliquis twice daily; do not miss doses Use Trelegy daily Use Duonebs every 8 hours with albuterol  nebulizer treatments as needed for wheezing or shortness of breath Hypertonic saline nebulizer treatments twice daily for 2 more days Do not smoke!  Discharge Diagnoses: Principal Problem:   Acute exacerbation of chronic obstructive pulmonary disease (COPD) (HCC) Active Problems:   SOB (shortness of breath)   Single subsegmental pulmonary embolism without acute cor pulmonale (HCC)   Mucus plugging of bronchi   Acute encephalopathy   Acquired hypothyroidism   Hospital Course: 48yo with h/o lung CA s/p radiation who presented on 5/5 with SOB, found to have COPD exacerbation as well as a small acute PE (thought to be from radiation thrombosis).  Started on Lovenox, steroids, nebs.  Assessment and Plan:  Acute on chronic respiratory failure associated with a COPD exacerbation with mucus plugging Patient's shortness of breath and productive cough are most likely caused by acute COPD exacerbation as well as mucus plugging seen on imaging Chest x-ray/CT is not consistent with pneumonia Admitted to progressive care Treated with steroids and nebulizers with improvement Continue Trelegy She appears to need home O2 Hypertonic saline nebs ordered x 2 more days   PE Thought to be related to radiation thrombosis No R heart strain on CT but checking echo (severe pulm HTN noted but reviewed again by cardiology and this is  no longer thought to be present) Started on full-dose Lovenox -> Eliquis   Acute metabolic encephalopathy Patient presenting with encephalopathy as evidenced by her confusion This has resolved and she appears to be at her baseline mental status   H/o lung cancer Stage IIb NSCLC, diagnosed in 11/2022 Completed chemoradiation She was due to f/u in 6 months (12/2023) and appears to have been lost to f/u No obvious recurrence appreciated on CTA Will refer back to oncology by messaging Dr. Marguerita Shih     Hypothyroidism Continue Synthroid at current dose for now                Consultants: [facility] team   Procedures: Echocardiogram   Antibiotics: Azithromycin  x 2 doses    Pain control - [place]  Controlled Substance Reporting System database was reviewed. and patient was instructed, not to drive, operate heavy machinery, perform activities at heights, swimming or participation in water activities or provide baby-sitting services while on Pain, Sleep and Anxiety Medications; until their outpatient Physician has advised to do so again. Also recommended to not to take more than prescribed Pain, Sleep and Anxiety Medications.    Disposition: Home Diet recommendation:  Regular diet DISCHARGE MEDICATION: Allergies as of 03/12/2024       Reactions   Paclitaxel  Other (See Comments)   Patient had a hypersensitivity reaction on 2/19. See progress note on 12/26/22.        Medication List     STOP taking these medications    albuterol  108 (90 Base) MCG/ACT inhaler Commonly known as: VENTOLIN  HFA Replaced by: albuterol  (2.5 MG/3ML) 0.083% nebulizer solution   Integra  Plus Caps   omeprazole  20 MG capsule Commonly known as: PRILOSEC       TAKE these medications    albuterol  (2.5 MG/3ML) 0.083% nebulizer solution Commonly known as: PROVENTIL  Take 3 mLs (2.5 mg total) by nebulization every 4 (four) hours as needed for wheezing or shortness of breath. Replaces: albuterol  108  (90 Base) MCG/ACT inhaler   albuterol  108 (90 Base) MCG/ACT inhaler Commonly known as: VENTOLIN  HFA Inhale 2 puffs into the lungs every 2 (two) hours as needed for wheezing or shortness of breath.   Eliquis DVT/PE Starter Pack Generic drug: Apixaban Starter Pack (10mg  and 5mg ) Take as directed on package: start with two-5mg  tablets twice daily for 7 days. On day 8, switch to one-5mg  tablet twice daily.   guaiFENesin  600 MG 12 hr tablet Commonly known as: MUCINEX  Take 2 tablets (1,200 mg total) by mouth 2 (two) times daily.   ipratropium-albuterol  0.5-2.5 (3) MG/3ML Soln Commonly known as: DUONEB Take 3 mLs by nebulization 3 (three) times daily.   predniSONE  20 MG tablet Commonly known as: DELTASONE  Take 2 tablets (40 mg total) by mouth daily with breakfast for 3 days. Start taking on: Mar 13, 2024   sodium chloride  HYPERTONIC 3 % nebulizer solution Take 4 mLs by nebulization 2 (two) times daily for 2 days.   Trelegy Ellipta  100-62.5-25 MCG/ACT Aepb Generic drug: Fluticasone-Umeclidin-Vilant Inhale 1 puff into the lungs daily.               Durable Medical Equipment  (From admission, onward)           Start     Ordered   03/12/24 1727  For home use only DME Nebulizer machine  Once       Question Answer Comment  Patient needs a nebulizer to treat with the following condition COPD (chronic obstructive pulmonary disease) (HCC)   Length of Need Lifetime   Additional equipment included Administration kit      03/12/24 1726   03/12/24 1212  DME Oxygen  Once       Question Answer Comment  Length of Need Lifetime   Mode or (Route) Nasal cannula   Liters per Minute 2   Frequency Continuous (stationary and portable oxygen unit needed)   Oxygen conserving device Yes   Oxygen delivery system Gas      03/12/24 1212            Discharge Exam:   Subjective: Reports feeling better.  Thinks she needs home O2.  Feels ready for dc today.   Objective: Vitals:    03/12/24 1700 03/12/24 1721  BP:  116/68  Pulse: (!) 103 (!) 103  Resp: 19 15  Temp:    SpO2: 90% 92%    Intake/Output Summary (Last 24 hours) at 03/12/2024 1811 Last data filed at 03/12/2024 1000 Gross per 24 hour  Intake 1250 ml  Output 150 ml  Net 1100 ml   Filed Weights   03/11/24 1137 03/12/24 0400  Weight: 40.4 kg 40.9 kg    Exam:  General:  Appears calm and comfortable and is in NAD, on [place] but appears to be on RA with O2 sats in 80s, frail, cachectic Eyes:  EOMI, normal lids, iris ENT:  grossly normal hearing, lips & tongue, mmm Neck:  no LAD, masses or thyromegaly Cardiovascular:  RRR. No LE edema.  Respiratory:   CTA bilaterally with no wheezes/rales/rhonchi.  Normal respiratory effort. Abdomen:  soft, NT, ND Skin:  no rash or induration seen  on limited exam Musculoskeletal:  grossly normal tone BUE/BLE, good ROM, no bony abnormality Psychiatric:  grossly normal mood and affect, speech fluent and appropriate, AOx3 Neurologic:  CN 2-12 grossly intact, moves all extremities in coordinated fashion   Data Reviewed: I have reviewed the patient's lab results since admission.  Pertinent labs for today include:   Unremarkable on presentation, none today    Condition at discharge: improving  The results of significant diagnostics from this hospitalization (including imaging, microbiology, ancillary and laboratory) are listed below for reference.   Imaging Studies: ECHOCARDIOGRAM COMPLETE Result Date: 03/12/2024    ECHOCARDIOGRAM REPORT   Patient Name:   ALFHILD FEINER Date of Exam: 03/12/2024 Medical Rec #:  [id number]            Height:       65.0 in Accession #:    [id number]           Weight:       90.2 lb Date of Birth:  1975/11/17            BSA:          1.410 m Patient Age:    48 years             BP:           112/66 mmHg Patient Gender: F                    HR:           79 bpm. Exam Location:  Inpatient Procedure: 2D Echo, Cardiac Doppler and Color Doppler  (Both Spectral and Color            Flow Doppler were utilized during procedure).                                 MODIFIED REPORT:     This report was modified by Dorothye Gathers MD on 03/12/2024 due to Pulmonary  pressures were previoulsy mislabled based upon mitral regurgitant jet and not                 tricuspid regurgitant jet. RVSP appears normal.  Indications:     Pulmonary Embolus I26.09  History:         Patient has no prior history of Echocardiogram examinations.  Sonographer:     Hersey Lorenzo RDCS Referring Phys:  [id number] Roxana Copier Diagnosing Phys: Dorothye Gathers MD IMPRESSIONS  1. Left ventricular ejection fraction, by estimation, is 60 to 65%. The left ventricle has normal function. The left ventricle has no regional wall motion abnormalities. Left ventricular diastolic parameters were normal.  2. Right ventricular systolic function is normal. The right ventricular size is normal. There is normal pulmonary artery systolic pressure. The estimated right ventricular systolic pressure is 27.9 mmHg.  3. A small pericardial effusion is present. The pericardial effusion is localized near the right ventricle. There is no evidence of cardiac tamponade.  4. The mitral valve is normal in structure. Mild mitral valve regurgitation. No evidence of mitral stenosis.  5. The aortic valve is tricuspid. Aortic valve regurgitation is trivial. No aortic stenosis is present.  6. The inferior vena cava is dilated in size with >50% respiratory variability, suggesting right atrial pressure of 8 mmHg. FINDINGS  Left Ventricle: Left ventricular ejection fraction, by estimation, is 60 to 65%. The left ventricle has normal function. The left ventricle has no regional  wall motion abnormalities. The left ventricular internal cavity size was normal in size. There is  no left ventricular hypertrophy. Left ventricular diastolic parameters were normal. Right Ventricle: The right ventricular size is normal. No increase in right  ventricular wall thickness. Right ventricular systolic function is normal. There is normal pulmonary artery systolic pressure. The tricuspid regurgitant velocity is 2.23 m/s, and  with an assumed right atrial pressure of 8 mmHg, the estimated right ventricular systolic pressure is 27.9 mmHg. Left Atrium: Left atrial size was normal in size. Right Atrium: Right atrial size was normal in size. Pericardium: A small pericardial effusion is present. The pericardial effusion is localized near the right ventricle. There is no evidence of cardiac tamponade. Mitral Valve: The mitral valve is normal in structure. There is mild thickening of the mitral valve leaflet(s). Mild mitral valve regurgitation. No evidence of mitral valve stenosis. Tricuspid Valve: The tricuspid valve is normal in structure. Tricuspid valve regurgitation is trivial. No evidence of tricuspid stenosis. Aortic Valve: The aortic valve is tricuspid. Aortic valve regurgitation is trivial. No aortic stenosis is present. Pulmonic Valve: The pulmonic valve was normal in structure. Pulmonic valve regurgitation is not visualized. No evidence of pulmonic stenosis. Aorta: The aortic root is normal in size and structure. Venous: The inferior vena cava is dilated in size with greater than 50% respiratory variability, suggesting right atrial pressure of 8 mmHg. IAS/Shunts: No atrial level shunt detected by color flow Doppler.  LEFT VENTRICLE PLAX 2D LVIDd:         4.00 cm   Diastology LVIDs:         2.40 cm   LV e' medial:    11.30 cm/s LV PW:         0.80 cm   LV E/e' medial:  8.4 LV IVS:        0.90 cm   LV e' lateral:   11.90 cm/s LVOT diam:     1.90 cm   LV E/e' lateral: 8.0 LV SV:         45 LV SV Index:   32 LVOT Area:     2.84 cm  RIGHT VENTRICLE             IVC RV S prime:     12.20 cm/s  IVC diam: 2.30 cm TAPSE (M-mode): 2.0 cm LEFT ATRIUM           Index LA diam:      2.60 cm 1.84 cm/m LA Vol (A4C): 22.4 ml 15.89 ml/m  AORTIC VALVE LVOT Vmax:   91.00 cm/s  LVOT Vmean:  51.800 cm/s LVOT VTI:    0.157 m  AORTA Ao Root diam: 2.50 cm MITRAL VALVE               TRICUSPID VALVE MV Area (PHT): 5.88 cm    TR Peak grad:   19.9 mmHg MV Decel Time: 129 msec    TR Vmax:        223.00 cm/s MV E velocity: 95.10 cm/s MV A velocity: 91.70 cm/s  SHUNTS MV E/A ratio:  1.04        Systemic VTI:  0.16 m                            Systemic Diam: 1.90 cm Dorothye Gathers MD Electronically signed by Dorothye Gathers MD Signature Date/Time: 03/12/2024/5:34:46 PM    Final (Updated)    CT Angio Chest PE W and/or  Wo Contrast Result Date: 03/11/2024 CLINICAL DATA:  Pulmonary embolism (PE) suspected, high prob, history of lung cancer. EXAM: CT ANGIOGRAPHY CHEST WITH CONTRAST TECHNIQUE: Multidetector CT imaging of the chest was performed using the standard protocol during bolus administration of intravenous contrast. Multiplanar CT image reconstructions and MIPs were obtained to evaluate the vascular anatomy. RADIATION DOSE REDUCTION: This exam was performed according to the departmental dose-optimization program which includes automated exposure control, adjustment of the mA and/or kV according to patient size and/or use of iterative reconstruction technique. CONTRAST:  75mL OMNIPAQUE  IOHEXOL  350 MG/ML SOLN COMPARISON:  December 13, 2023 FINDINGS: Pulmonary Embolism: The segmental pulmonary artery to the posterior left lung apex is thrombosed (axial 65-59). Cardiovascular: No cardiomegaly. Unchanged small pericardial effusion. No aortic aneurysm. Mediastinum/Nodes: No mediastinal mass. No mediastinal, hilar, or axillary lymphadenopathy. Unchanged, partially visualized solid left thyroid  lobe nodule. Lungs/Pleura: Midline trachea is patent. Diffuse bronchial wall thickening with areas of mucous plugging. Advanced destructive emphysema with large bullous change filling the entire right upper lobe. Similar ground-glass airspace opacities within the left upper lobe with areas of pleuroparenchymal scarring in  the medial left lung apex, likely post treatment changes. Fibrolinear scarring or subsegmental atelectasis within both lower lobes anteriorly, the right middle lobe, and lingula. No pleural effusion or pneumothorax. Musculoskeletal: No acute fracture or destructive bone lesion. Mild dextrocurvature of the thoracic spine. Bilateral Harrington fusion rods. Upper Abdomen: No acute abnormality in the partially visualized upper abdomen. Review of the MIP images confirms the above findings. IMPRESSION: 1. The segmental pulmonary artery to the posterior left lung apex is thrombosed (axial 65-59). While this is consistent with a pulmonary embolus, it may be secondary to post radiation changes from the left upper lobe neoplasm. No additional pulmonary emboli visualized or findings of right heart strain present. 2. Similar changes of advanced destructive and bullous emphysema. Multifocal mucous plugging within the right lower lobe. No superimposed pneumonia. Critical Value/emergent results were called by telephone at the time of interpretation on 03/11/2024 at 7:19 pm to provider JOSHUA LONG , who verbally acknowledged these results. Electronically Signed   By: Rance Burrows M.D.   On: 03/11/2024 19:29   DG Chest 2 View Result Date: 03/11/2024 CLINICAL DATA:  Chest pain. EXAM: CHEST - 2 VIEW COMPARISON:  Chest radiograph dated 11/15/2022. FINDINGS: Severe emphysema. No focal consolidation, pleural effusion, pneumothorax. The cardiac silhouette is within limits. Osteopenia. Spinal Harrington rods. No acute osseous pathology. IMPRESSION: 1. No active cardiopulmonary disease. 2. Severe emphysema. Electronically Signed   By: Angus Bark M.D.   On: 03/11/2024 12:27    Microbiology: Results for orders placed or performed during the hospital encounter of 03/11/24  MRSA Next Gen by PCR, Nasal     Status: None   Collection Time: 03/12/24  3:47 AM   Specimen: Nasal Mucosa; Nasal Swab  Result Value Ref Range Status    MRSA by PCR Next Gen NOT DETECTED NOT DETECTED Final    Comment: (NOTE) The GeneXpert MRSA Assay (FDA approved for NASAL specimens only), is one component of a comprehensive MRSA colonization surveillance program. It is not intended to diagnose MRSA infection nor to guide or monitor treatment for MRSA infections. Test performance is not FDA approved in patients less than 83 years old. Performed at [facility], [address], [place], [place] [address]     Labs: CBC: Recent Labs  Lab 03/11/24 1153 03/12/24 0347  WBC 4.2 3.9*  NEUTROABS  --  3.5  HGB 13.0 11.6*  HCT 43.1 41.0  MCV 86.9 90.3  PLT 212 182   Basic Metabolic Panel: Recent Labs  Lab 03/11/24 1153 03/11/24 1620 03/12/24 0347  NA 137  --  136  K 4.1  --  3.6  CL 100  --  104  CO2 27  --  22  GLUCOSE 81  --  143*  BUN 9  --  11  CREATININE 0.79  --  0.83  CALCIUM 9.5  --  8.7*  MG  --  1.7 1.6*  PHOS  --   --  3.7   Liver Function Tests: Recent Labs  Lab 03/12/24 0347  AST 28  ALT 19  ALKPHOS 55  BILITOT 0.7  PROT 6.6  ALBUMIN 3.6   CBG: No results for input(s): "GLUCAP" in the last 168 hours.  Discharge time spent: greater than 30 minutes.  Signed: Lorita Rosa, MD [facility] 03/12/2024

## 2024-03-12 NOTE — TOC Initial Note (Signed)
 Transition of Care [facility]) - Initial/Assessment Note    Patient Details  Name: Elaine Gibson MRN: [id number] Date of Birth: Oct 19, 1976  Transition [facility]) CM/SW Contact:    Amaryllis Junior, LCSW Phone Number: 03/12/2024, 11:54 AM  Clinical Narrative:                 Pt from home with family. Pt continues medical workup. TOC following for dc needs.     Barriers to Discharge: Continued Medical Work up   Patient Goals and CMS Choice Patient states their goals for this hospitalization and ongoing recovery are:: return home CMS Medicare.gov Compare Post Acute Care list provided to::  (NA) Choice offered to / list presented to : NA [place] ownership interest in [facility].provided to::  (NA)    Expected Discharge Plan and Services In-house Referral: NA     Living arrangements for the past 2 months: Apartment                 DME Arranged: N/A DME Agency: NA       HH Arranged: NA HH Agency: NA        Prior Living Arrangements/Services Living arrangements for the past 2 months: Apartment Lives with:: Relatives Patient language and need for interpreter reviewed:: Yes Do you feel safe going back to the place where you live?: Yes      Need for Family Participation in Patient Care: Yes (Comment) Care giver support system in place?: Yes (comment)   Criminal Activity/Legal Involvement Pertinent to Current Situation/Hospitalization: No - Comment as needed  Activities of Daily Living      Permission Sought/Granted                  Emotional Assessment Appearance:: Appears stated age Attitude/Demeanor/Rapport: Engaged Affect (typically observed): Accepting Orientation: : Oriented to Place, Oriented to Self, Oriented to  Time, Oriented to Situation Alcohol / Substance Use: Not Applicable Psych Involvement: No (comment)  Admission diagnosis:  SOB (shortness of breath) [R06.02] Acute exacerbation of chronic obstructive pulmonary disease (COPD)  (HCC) [J44.1] Single subsegmental pulmonary embolism without acute cor pulmonale (HCC) [I26.93] Patient Active Problem List   Diagnosis Date Noted   SOB (shortness of breath) 03/12/2024   Single subsegmental pulmonary embolism without acute cor pulmonale (HCC) 03/12/2024   Mucus plugging of bronchi 03/12/2024   Acute encephalopathy 03/12/2024   Acquired hypothyroidism 03/12/2024   Acute exacerbation of chronic obstructive pulmonary disease (COPD) (HCC) 03/11/2024   Non-small cell lung cancer, left (HCC) 11/21/2022   Encounter for antineoplastic chemotherapy 11/21/2022   Lung mass 11/01/2022   Mass of left lung 10/31/2022   Normocytic anemia 10/31/2022   Thrombocytosis 10/31/2022   COPD (chronic obstructive pulmonary disease) (HCC) 07/18/2019   Protein calorie malnutrition (HCC) 07/18/2019   Pulmonary nodule 07/18/2019   ASCUS with positive high risk HPV cervical 12/27/2018   PCP:  Lanae Pinal, MD Pharmacy:   [id number] Jonette Nestle, [place] - [address] AT [facility] [address] [place] [address] Phone: [phone] Fax: [phone]     Social Drivers of Health (SDOH) Social History: SDOH Screenings   Food Insecurity: No Food Insecurity (03/12/2024)  Housing: Unknown (03/12/2024)  Transportation Needs: No Transportation Needs (03/12/2024)  Utilities: Not At Risk (03/12/2024)  Tobacco Use: Medium Risk (03/12/2024)   SDOH Interventions:     Readmission Risk Interventions    03/12/2024   11:51 AM  Readmission Risk Prevention Plan  Transportation Screening Complete  PCP or Specialist Appt within 5-7 Days Complete  Home Care Screening Complete  Medication Review (RN CM) Complete

## 2024-03-12 NOTE — Progress Notes (Signed)
 Discharge medications given  to D. Younts RN D [employer]

## 2024-03-12 NOTE — Progress Notes (Signed)
 Discharge medications delivered to patient, placed on computer in room.

## 2024-03-13 ENCOUNTER — Other Ambulatory Visit (HOSPITAL_COMMUNITY): Payer: Self-pay

## 2024-03-20 ENCOUNTER — Encounter: Payer: Self-pay | Admitting: Pulmonary Disease

## 2024-03-20 ENCOUNTER — Ambulatory Visit: Admitting: Pulmonary Disease

## 2024-03-20 VITALS — BP 90/68 | HR 88 | Ht 65.0 in | Wt 90.8 lb

## 2024-03-20 DIAGNOSIS — I2699 Other pulmonary embolism without acute cor pulmonale: Secondary | ICD-10-CM

## 2024-03-20 DIAGNOSIS — C3492 Malignant neoplasm of unspecified part of left bronchus or lung: Secondary | ICD-10-CM

## 2024-03-20 DIAGNOSIS — J449 Chronic obstructive pulmonary disease, unspecified: Secondary | ICD-10-CM | POA: Diagnosis not present

## 2024-03-20 MED ORDER — TRELEGY ELLIPTA 200-62.5-25 MCG/ACT IN AEPB: 1.0000 | INHALATION_SPRAY | Freq: Every day | RESPIRATORY_TRACT | 11 refills | Status: DC

## 2024-03-20 MED ORDER — APIXABAN 5 MG PO TABS: 5.0000 mg | ORAL_TABLET | Freq: Two times a day (BID) | ORAL | 5 refills | Status: DC

## 2024-03-20 NOTE — Progress Notes (Unsigned)
 Synopsis: Referred in May 2025 for pulmonary embolism  Subjective:   PATIENT ID: Elaine Gibson GENDER: female DOB: August 12, 1976, MRN: [id number]   HPI  No chief complaint on file.  Elaine Gibson is a 48 year old woman, former smoker with history of emphysema, stage IIB NSCLC with LUL lung mass and recent hospitalization for pulmonary embolism who is referred to pulmonary clinic for hospital follow up.  Patient was admitted 5/5 to 5/6 for pulmonary embolism started on lovenox  then transitioned to eliquis .   She experiences persistent shortness of breath and fatigue, recently exacerbated, leading to a hospital visit. During this visit, she was started on Eliquis  for a blood clot and completed prednisone  and antibiotics. She uses Trelegy Ellipta  daily and albuterol  infrequently. She experiences frequent coughing and wheezing, especially when lying down, with difficulty expectorating mucus.  During her hospital stay, she required oxygen for low oxygen levels and increased heart rate during activities. She uses oxygen as needed but inconsistently. Her oxygen levels drop and heart rate increases with activity, limiting her ability to work.  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 She does not smoke. She experiences difficulty sleeping due to breathing issues, waking with chest tightness and coughing. She uses a nebulizer, which causes shakiness. She has a flutter valve but is not using it.  Past Medical History:  Diagnosis Date   Alpha-1-antitrypsin deficiency carrier    Dyspnea    Emphysema lung (HCC)    Emphysema of lung (HCC)    Hypothyroidism    tumors    Scoliosis      Family History  Problem Relation Age of Onset   Hypertension Mother    Kidney disease Mother    Stomach cancer Father    Gestational diabetes Sister      Social History   Socioeconomic History   Marital status: Single    Spouse name: Not on file    Number of children: Not on file   Years of education: Not on file   Highest education level: Not on file  Occupational History   Not on file  Tobacco Use   Smoking status: Former    Types: Cigars    Start date: 15    Quit date: 05/04/2019    Years since quitting: 4.8   Smokeless tobacco: Never   Tobacco comments:    3 black and milds a day   Vaping Use   Vaping status: Never Used  Substance and Sexual Activity   Alcohol use: No   Drug use: Never   Sexual activity: Not Currently    Partners: Male    Birth control/protection: None  Other Topics Concern   Not on file  Social History Narrative   Not on file   Social Drivers of Health   Financial Resource Strain: Not on file  Food Insecurity: No Food Insecurity (03/12/2024)   Hunger Vital Sign    Worried About Running Out of Food in the Last Year: Never true    Ran Out of Food in the Last Year: Never true  Transportation Needs: No Transportation Needs (03/12/2024)   PRAPARE - Administrator, Civil Service (Medical): No    Lack of Transportation (Non-Medical): No  Physical Activity: Not on file  Stress: Not on file  Social Connections: Not on file  Intimate Partner Violence: Unknown (03/12/2024)   Humiliation, Afraid, Rape, and Kick questionnaire    Fear of Current or Ex-Partner: No  Emotionally Abused: No    Physically Abused: No    Sexually Abused: Not on file     Allergies  Allergen Reactions   Paclitaxel  Other (See Comments)    Patient had a hypersensitivity reaction on 2/19. See progress note on 12/26/22.     Outpatient Medications Prior to Visit  Medication Sig Dispense Refill   albuterol  (PROVENTIL ) (2.5 MG/3ML) 0.083% nebulizer solution Take 3 mLs (2.5 mg total) by nebulization every 4 (four) hours as needed for wheezing or shortness of breath. 75 mL 12   albuterol  (VENTOLIN  HFA) 108 (90 Base) MCG/ACT inhaler Inhale 2 puffs into the lungs every 2 (two) hours as needed for wheezing or shortness of  breath. Max of 12 puffs per day 18 g 1   APIXABAN  (ELIQUIS ) VTE STARTER PACK (10MG  AND 5MG ) Take as directed on package: start with two-5mg  tablets twice daily for 7 days. On day 8, switch to one-5mg  tablet twice daily. 74 each 0   guaiFENesin  (MUCINEX ) 600 MG 12 hr tablet Take 2 tablets (1,200 mg total) by mouth 2 (two) times daily. 120 tablet 0   ipratropium-albuterol  (DUONEB) 0.5-2.5 (3) MG/3ML SOLN Take 3 mLs by nebulization 3 (three) times daily. 360 mL 0   Fluticasone -Umeclidin-Vilant (TRELEGY ELLIPTA ) 100-62.5-25 MCG/ACT AEPB Inhale 1 puff into the lungs daily. 60 each 0   No facility-administered medications prior to visit.    Review of Systems  Constitutional:  Negative for chills, fever, malaise/fatigue and weight loss.  HENT:  Negative for congestion, sinus pain and sore throat.   Eyes: Negative.   Respiratory:  Positive for cough, sputum production, shortness of breath and wheezing. Negative for hemoptysis.   Cardiovascular:  Negative for chest pain, palpitations, orthopnea, claudication and leg swelling.  Gastrointestinal:  Negative for abdominal pain, heartburn, nausea and vomiting.  Genitourinary: Negative.   Musculoskeletal:  Negative for joint pain and myalgias.  Skin:  Negative for rash.  Neurological:  Negative for weakness.  Endo/Heme/Allergies: Negative.   Psychiatric/Behavioral: Negative.     Objective:   Vitals:   03/20/24 1012  BP: 90/68  Pulse: 88  SpO2: 95%  Weight: 90 lb 12.8 oz (41.2 kg)  Height: 5\' 5"  (1.651 m)   Physical Exam Constitutional:      General: She is not in acute distress.    Appearance: Normal appearance.  Eyes:     General: No scleral icterus.    Conjunctiva/sclera: Conjunctivae normal.  Cardiovascular:     Rate and Rhythm: Normal rate and regular rhythm.  Pulmonary:     Breath sounds: No wheezing, rhonchi or rales.  Musculoskeletal:     Right lower leg: No edema.     Left lower leg: No edema.  Skin:    General: Skin is warm  and dry.  Neurological:     General: No focal deficit present.    CBC    Component Value Date/Time   WBC 3.9 (L) 03/12/2024 0347   RBC 4.54 03/12/2024 0347   HGB 11.6 (L) 03/12/2024 0347   HGB 11.4 (L) 12/13/2023 1446   HCT 41.0 03/12/2024 0347   PLT 182 03/12/2024 0347   PLT 237 12/13/2023 1446   MCV 90.3 03/12/2024 0347   MCH 25.6 (L) 03/12/2024 0347   MCHC 28.3 (L) 03/12/2024 0347   RDW 12.4 03/12/2024 0347   LYMPHSABS 0.3 (L) 03/12/2024 0347   MONOABS 0.1 03/12/2024 0347   EOSABS 0.0 03/12/2024 0347   BASOSABS 0.0 03/12/2024 0347      Latest Ref Rng &  Units 03/12/2024    3:47 AM 03/11/2024   11:53 AM 12/13/2023    2:46 PM  BMP  Glucose 70 - 99 mg/dL 161  81  096   BUN 6 - 20 mg/dL 11  9  12    Creatinine 0.44 - 1.00 mg/dL 0.45  4.09  8.11   Sodium 135 - 145 mmol/L 136  137  137   Potassium 3.5 - 5.1 mmol/L 3.6  4.1  5.2   Chloride 98 - 111 mmol/L 104  100  104   CO2 22 - 32 mmol/L 22  27  30    Calcium 8.9 - 10.3 mg/dL 8.7  9.5  9.6    Chest imaging: CTA Chest 03/11/24 1. The segmental pulmonary artery to the posterior left lung apex is thrombosed (axial 65-59). While this is consistent with a pulmonary embolus, it may be secondary to post radiation changes from the left upper lobe neoplasm. No additional pulmonary emboli visualized or findings of right heart strain present. 2. Similar changes of advanced destructive and bullous emphysema. Multifocal mucous plugging within the right lower lobe. No superimposed pneumonia.  PFT:    Latest Ref Rng & Units 07/18/2019    1:44 PM  PFT Results  FVC-Pre L 1.84   FVC-Predicted Pre % 58   FVC-Post L 1.74   FVC-Predicted Post % 55   Pre FEV1/FVC % % 59   Post FEV1/FCV % % 62   FEV1-Pre L 1.09   FEV1-Predicted Pre % 42   FEV1-Post L 1.07   DLCO uncorrected ml/min/mmHg 9.61   DLCO UNC% % 42   DLVA Predicted % 80   TLC L 4.29   TLC % Predicted % 82   RV % Predicted % 153     Labs:  Path:  Echo: LV EF 60-65%. RV  size and systolic function are normal. PASP 27.4mmHg.   Heart Catheterization:     Assessment & Plan:   Non-small cell lung cancer, left (HCC)  Other pulmonary embolism without acute cor pulmonale, unspecified chronicity (HCC)  Chronic obstructive pulmonary disease, unspecified COPD type (HCC) - Plan: Pulse oximetry, overnight, Fluticasone -Umeclidin-Vilant (TRELEGY ELLIPTA ) 200-62.5-25 MCG/ACT AEPB  Discussion: Elaine Gibson is a 48 year old woman, former smoker with history of emphysema, stage IIB NSCLC with LUL lung mass and recent hospitalization for pulmonary embolism who is referred to pulmonary clinic for hospital follow up.  Pulmonary embolism - Continue Eliquis  for six months at full dose. - Consider future reduction to half dose anticoagulation.  COPD with severe obstruction Severe obstruction with reduced diffusion capacity. Symptoms include frequent cough and wheezing. Current treatment includes Trelegy Ellipta  and albuterol . - Increase Trelegy Ellipta  to 200mcg 1 puff daily - Use nebulizer treatments twice daily. - Use flutter valve after nebulizer treatments for mucus clearance.  Hypoxemia Oxygen therapy initiated for hypoxemia during hospitalization.  - Perform ambulation test to assess oxygen levels. - Conduct overnight oxygen test on room air.  Lung cancer Completed all treatments, under follow-up with Dr. Marguerita Shih. No active treatment.  Follow up in 6 months  Duaine German, MD [facility] Office: [phone]    Current Outpatient Medications:    albuterol  (PROVENTIL ) (2.5 MG/3ML) 0.083% nebulizer solution, Take 3 mLs (2.5 mg total) by nebulization every 4 (four) hours as needed for wheezing or shortness of breath., Disp: 75 mL, Rfl: 12   albuterol  (VENTOLIN  HFA) 108 (90 Base) MCG/ACT inhaler, Inhale 2 puffs into the lungs every 2 (two) hours as needed for  wheezing or shortness of breath. Max of 12 puffs per day, Disp: 18 g, Rfl: 1    APIXABAN  (ELIQUIS ) VTE STARTER PACK (10MG  AND 5MG ), Take as directed on package: start with two-5mg  tablets twice daily for 7 days. On day 8, switch to one-5mg  tablet twice daily., Disp: 74 each, Rfl: 0   Fluticasone -Umeclidin-Vilant (TRELEGY ELLIPTA ) 200-62.5-25 MCG/ACT AEPB, Inhale 1 puff into the lungs daily., Disp: 28 each, Rfl: 11   guaiFENesin  (MUCINEX ) 600 MG 12 hr tablet, Take 2 tablets (1,200 mg total) by mouth 2 (two) times daily., Disp: 120 tablet, Rfl: 0   ipratropium-albuterol  (DUONEB) 0.5-2.5 (3) MG/3ML SOLN, Take 3 mLs by nebulization 3 (three) times daily., Disp: 360 mL, Rfl: 0

## 2024-03-20 NOTE — Patient Instructions (Signed)
 Start Trellegy ellipta 200mcg, 1 puff daily - rinse mouth out after each use  Use duoneb nebulizer treatment twice daily followed by flutter valve for mucous clearance  We will schedule you for overnight oxygen testing on room air  We will walk you today to evaluate your oxygen levels  Follow up in 6 months

## 2024-03-21 ENCOUNTER — Telehealth: Payer: Self-pay

## 2024-03-21 ENCOUNTER — Encounter: Payer: Self-pay | Admitting: Pulmonary Disease

## 2024-03-21 NOTE — Telephone Encounter (Signed)
 Copied from CRM [id number]. Topic: General - Other >> Mar 20, 2024 12:56 PM Hilton Lucky wrote: Reason for CRM: Patient requesting a letter for return to work be sent to her that specifies she can return to work tonight full duty. Patient sttaes note given today is incorrect. >> Mar 21, 2024  7:54 AM Evie Hoff wrote: Patient is calling concerning a letter she needs for work . Patient says doctor said she could return but her job is not letting her because she needs a doctor note that specify that she can return to work doing full duty  Dr. [name] please advise if OK for pt to return to work

## 2024-03-22 ENCOUNTER — Telehealth (HOSPITAL_BASED_OUTPATIENT_CLINIC_OR_DEPARTMENT_OTHER): Payer: Self-pay

## 2024-03-22 NOTE — Telephone Encounter (Signed)
 Please advise on return to work note?   Copied from CRM [id number]. Topic: General - Other >> Mar 22, 2024  2:10 PM [place] S wrote: Reason for CRM: patient is calling for letter to be sent to her statingf its okay for her to go back to work, please send and contact patient as soon as possible.

## 2024-03-25 ENCOUNTER — Other Ambulatory Visit: Payer: Self-pay

## 2024-03-25 ENCOUNTER — Telehealth: Payer: Self-pay | Admitting: Pulmonary Disease

## 2024-03-25 ENCOUNTER — Telehealth: Payer: Self-pay

## 2024-03-25 DIAGNOSIS — C3492 Malignant neoplasm of unspecified part of left bronchus or lung: Secondary | ICD-10-CM

## 2024-03-25 DIAGNOSIS — J449 Chronic obstructive pulmonary disease, unspecified: Secondary | ICD-10-CM

## 2024-03-25 DIAGNOSIS — I2699 Other pulmonary embolism without acute cor pulmonale: Secondary | ICD-10-CM

## 2024-03-25 NOTE — Telephone Encounter (Signed)
 Reorder w/ on room air

## 2024-03-25 NOTE — Telephone Encounter (Signed)
 Spoke w/ PT letter at front desk and sent one through my chart

## 2024-03-25 NOTE — Telephone Encounter (Signed)
 Spoke with patient , sent one letter through

## 2024-03-25 NOTE — Telephone Encounter (Signed)
 Patient is calling back concerning a letter she needs to go back to work . Patient is needing to go back to work . Transferred patient to Ms. Tanessa

## 2024-03-25 NOTE — Telephone Encounter (Signed)
 Can a Dr. Marvia Slocumb the day sign this for her??? Dr. [name] off next two days.   PT calling again for note to return to work. Has called everyday since last Wednesday. Last time some regular paperwork was given to her but she needs a letter with specifics:  OK to return to work on the evening of 5/20 for full duties with no restrictions.  Dated   Signed  Please call PT if she has any questions @ [phone].  Needs sent to [facility] ASAP Please

## 2024-03-25 NOTE — Telephone Encounter (Signed)
 Spoke to patient, sent one letter through mychart as well as print out left upfront for her to pick up.

## 2024-03-25 NOTE — Telephone Encounter (Signed)
 Please update ono order on how the provider would like it to be completed   For ex. On room air

## 2024-03-25 NOTE — Telephone Encounter (Signed)
 She also states she can pick it up today.

## 2024-03-26 NOTE — Telephone Encounter (Signed)
 Reorder test another provider was able to sign NFN

## 2024-03-26 NOTE — Telephone Encounter (Signed)
 Duplicate please see other telephone note

## 2024-04-12 ENCOUNTER — Ambulatory Visit: Payer: Self-pay | Admitting: Pulmonary Disease

## 2024-04-12 ENCOUNTER — Other Ambulatory Visit: Payer: Self-pay | Admitting: Family Medicine

## 2024-04-12 ENCOUNTER — Telehealth: Payer: Self-pay

## 2024-04-12 ENCOUNTER — Other Ambulatory Visit (HOSPITAL_COMMUNITY): Payer: Self-pay

## 2024-04-12 DIAGNOSIS — J9611 Chronic respiratory failure with hypoxia: Secondary | ICD-10-CM

## 2024-04-12 DIAGNOSIS — J449 Chronic obstructive pulmonary disease, unspecified: Secondary | ICD-10-CM

## 2024-04-12 DIAGNOSIS — I2699 Other pulmonary embolism without acute cor pulmonale: Secondary | ICD-10-CM

## 2024-04-12 MED ORDER — APIXABAN 5 MG PO TABS
5.0000 mg | ORAL_TABLET | Freq: Two times a day (BID) | ORAL | 5 refills | Status: AC
  Filled 2024-04-12 – 2024-04-22 (×2): qty 60, 30d supply, fill #0
  Filled 2024-05-15: qty 60, 30d supply, fill #1
  Filled 2024-06-14 – 2024-07-06 (×2): qty 60, 30d supply, fill #2

## 2024-04-12 MED ORDER — TRELEGY ELLIPTA 200-62.5-25 MCG/ACT IN AEPB
1.0000 | INHALATION_SPRAY | Freq: Every day | RESPIRATORY_TRACT | 11 refills | Status: AC
  Filled 2024-04-12 – 2024-04-22 (×2): qty 60, 30d supply, fill #0
  Filled 2024-05-15: qty 60, 30d supply, fill #1
  Filled 2024-06-14 – 2024-07-06 (×2): qty 60, 30d supply, fill #2

## 2024-04-12 NOTE — Telephone Encounter (Signed)
 Attempted to call patient to ensure patient was not having any symptoms.  No answer---Left patient a voicemail.

## 2024-04-12 NOTE — Telephone Encounter (Signed)
 Noted, JD out of office till June 16th.

## 2024-04-12 NOTE — Telephone Encounter (Signed)
 Copied from CRM [id number]. Topic: Clinical - Lab/Test Results >> Apr 12, 2024  8:12 AM Juliana Ocean wrote: Reason for CRM: pt states she was informed by [facility] her sleep study results are in.  Pt would like a c/b to go over results  I called [employer] and spoke with Brad. Rodman Clam stated he did have a copy of the ONO and he will fax a copy to B pod with my ATTN to it. Once fax is received, we can have it scanned and reviewed by Dr [name]. Holding note until fax is sent to us .

## 2024-04-12 NOTE — Telephone Encounter (Signed)
 FYI Only or Action Required?: Action required by provider  Patient is followed in Pulmonology for COPD, Non-small Cell Lung Cancer, PE, last seen on 03/20/2024 by [name], MD. Called Nurse Triage reporting Information Only. Triage call received on 04/12/2024 Patient/caregiver reports concerns of Request for refill or documentation and Other  Assessment conducted based on subjective report and clinical judgment within nursing scope.  Patient advised based on reported symptoms, risk factors, and urgency of presentation.  Triage Disposition: Call PCP When Office is Open  Patient/caregiver understands and will follow disposition?: Yes                Copied from CRM [id number]. Topic: Clinical - Lab/Test Results >> Apr 12, 2024 11:31 AM Elaine Gibson wrote: Reason for CRM: patient returning call for ono results, explained last notes left. Reason for Disposition  [1] Caller requesting NON-URGENT health information AND [2] PCP'Gibson office is the best resource  Answer Assessment - Initial Assessment Questions Patient stated that she would like her prescriptions that were sent to [facility] to be sent to [facility].  She states that the ones that were sent to [facility] were:  apixaban  (ELIQUIS ) 5 MG TABS tablet [121301]   Fluticasone -Umeclidin-Vilant (TRELEGY ELLIPTA ) 200-62.5-25 MCG/ACT AEPB [160045]    She is advised that I would send this message to the office and she may call [facility] to see if she can get them transferred herself.  Patient also wanted to know about the results of her ONO.  She was advised that at this time this RN didn't see those results or any notes that Dr [name] has reviewed them.  Patient also inquired about FMLA paperwork---She states that she would like to get FMLA paperwork completed as soon as possible for her job.  She is aware that it may be a few days before any of these matters are addressed/taken care of. She is also advised  that if she has any symptoms over the weekend that worsen or anything the Emergency Room is available. Patient verbalized understanding.  Protocols used: Information Only Call - No Triage-A-AH

## 2024-04-12 NOTE — Telephone Encounter (Signed)
 I have sent her prescriptions to her preferred pharmacy  There is already a note pending waiting for her ONO results  I am unsure about the status of her FMLA forms  Routing to [place] regarding these   I did send her mychart msg about her refills

## 2024-04-12 NOTE — Telephone Encounter (Signed)
 Copied from CRM [id number]. Topic: Clinical - Medication Refill >> Apr 12, 2024  8:15 AM Juliana Ocean wrote: Medication: ipratropium-albuterol  (DUONEB) 0.5-2.5 (3) MG/3ML SOLN  Has the patient contacted their pharmacy? No Pt was prescribed after being in the hospital. This is the patient's preferred pharmacy:  [facility] [address] [place] [address] Phone: [phone] Fax: [phone]  Is this the correct pharmacy for this prescription? Yes If no, delete pharmacy and type the correct one.   Has the prescription been filled recently? Yes  Is the patient out of the medication? Yes  Has the patient been seen for an appointment in the last year OR does the patient have an upcoming appointment? Yes  Can we respond through MyChart? No  Agent: Please be advised that Rx refills may take up to 3 business days. We ask that you follow-up with your pharmacy.

## 2024-04-15 NOTE — Telephone Encounter (Signed)
 Emailed/ Scanned  documents to Dr. [name]

## 2024-04-15 NOTE — Telephone Encounter (Signed)
 Fax received. Putting the ONO copy in Dr Reine Caraway cabinet

## 2024-04-15 NOTE — Telephone Encounter (Signed)
 Please let patient know she spent 5hr and 4 min with oxygen less than 88% when sleeping. Recommend she use 3L of supplemental oxygen when sleeping. Please place order for home concentrator and supplies for night time oxygen use.  Thanks, JD

## 2024-04-16 NOTE — Telephone Encounter (Signed)
 Called patient.  Reviewed ONO results.  Ordered home oxygen and supplies on this encounter.  Patient verbalized understanding.

## 2024-04-16 NOTE — Addendum Note (Signed)
 Addended byRefugio Cantor M on: 04/16/2024 02:50 PM   Modules accepted: Orders

## 2024-04-16 NOTE — Telephone Encounter (Signed)
 No answer, Lm to return call.

## 2024-04-17 ENCOUNTER — Telehealth: Payer: Self-pay | Admitting: Internal Medicine

## 2024-04-17 ENCOUNTER — Telehealth: Payer: Self-pay | Admitting: Pulmonary Disease

## 2024-04-17 NOTE — Telephone Encounter (Signed)
 This is Dr Reine Caraway patient and the is out. Is the DME company asking that O2 order be specifically for Nocturnal O2??  Ok to clarify home O2 order as Nocturnal.

## 2024-04-17 NOTE — Telephone Encounter (Signed)
 Patient dropped off FMLA paper work to be completed by Dr. [name]. Patient was informed of the processing fee of $29.00. Paper work is in Dr.'s Technical brewer at [employer]

## 2024-04-17 NOTE — Telephone Encounter (Signed)
 Noted.   Not in clinic until 06/16,He will fill out when he returns to office on 06/16 -

## 2024-04-17 NOTE — Addendum Note (Signed)
 Addended byRefugio Cantor M on: 04/17/2024 04:24 PM   Modules accepted: Orders

## 2024-04-17 NOTE — Telephone Encounter (Signed)
 Yes it just needs to state nocturnal O2. Thanks!

## 2024-04-17 NOTE — Telephone Encounter (Signed)
 Per Devra Fontana at [employer]  The Frequency on the RX needs to be changed to Nocturnal please.    Please advise when it has been updated. Thanks!

## 2024-04-17 NOTE — Telephone Encounter (Signed)
 This is Dr. Arvin Laundry patient. Please see what Dr. Linder Revere wrote:  Dr Reine Caraway oxygen order is for Nocturnal O2 2L

## 2024-04-17 NOTE — Telephone Encounter (Signed)
 Please clarify for her DME- Dr Reine Caraway oxygen order is for Nocturnal O2 2L

## 2024-04-18 NOTE — Telephone Encounter (Signed)
 I have sent updated note to DME. NFN

## 2024-04-22 ENCOUNTER — Other Ambulatory Visit (HOSPITAL_COMMUNITY): Payer: Self-pay

## 2024-04-23 ENCOUNTER — Telehealth: Payer: Self-pay

## 2024-04-23 ENCOUNTER — Other Ambulatory Visit (HOSPITAL_COMMUNITY): Payer: Self-pay

## 2024-04-23 NOTE — Telephone Encounter (Signed)
 completed

## 2024-04-23 NOTE — Telephone Encounter (Signed)
 Spoke w/ PT paperwork ready will be up front for her

## 2024-05-02 ENCOUNTER — Other Ambulatory Visit (HOSPITAL_COMMUNITY): Payer: Self-pay

## 2024-05-06 ENCOUNTER — Other Ambulatory Visit: Payer: Self-pay

## 2024-05-06 ENCOUNTER — Other Ambulatory Visit (HOSPITAL_COMMUNITY): Payer: Self-pay

## 2024-06-14 ENCOUNTER — Other Ambulatory Visit (HOSPITAL_COMMUNITY): Payer: Self-pay

## 2024-06-17 ENCOUNTER — Other Ambulatory Visit (HOSPITAL_COMMUNITY): Payer: Self-pay

## 2024-06-18 ENCOUNTER — Other Ambulatory Visit: Payer: Self-pay | Admitting: Internal Medicine

## 2024-06-18 ENCOUNTER — Other Ambulatory Visit (HOSPITAL_COMMUNITY): Payer: Self-pay

## 2024-06-18 DIAGNOSIS — C3492 Malignant neoplasm of unspecified part of left bronchus or lung: Secondary | ICD-10-CM

## 2024-06-19 ENCOUNTER — Encounter (HOSPITAL_COMMUNITY): Payer: Self-pay

## 2024-06-19 ENCOUNTER — Ambulatory Visit (HOSPITAL_COMMUNITY)

## 2024-06-19 ENCOUNTER — Other Ambulatory Visit (HOSPITAL_COMMUNITY): Payer: Self-pay

## 2024-06-19 ENCOUNTER — Inpatient Hospital Stay: Payer: BC Managed Care – PPO | Attending: Internal Medicine

## 2024-06-19 DIAGNOSIS — Z9221 Personal history of antineoplastic chemotherapy: Secondary | ICD-10-CM | POA: Insufficient documentation

## 2024-06-19 DIAGNOSIS — C3412 Malignant neoplasm of upper lobe, left bronchus or lung: Secondary | ICD-10-CM | POA: Insufficient documentation

## 2024-06-19 DIAGNOSIS — Z923 Personal history of irradiation: Secondary | ICD-10-CM | POA: Insufficient documentation

## 2024-06-21 ENCOUNTER — Other Ambulatory Visit (HOSPITAL_COMMUNITY): Payer: Self-pay

## 2024-06-22 ENCOUNTER — Other Ambulatory Visit (HOSPITAL_COMMUNITY): Payer: Self-pay

## 2024-06-25 ENCOUNTER — Other Ambulatory Visit (HOSPITAL_COMMUNITY): Payer: Self-pay

## 2024-06-25 ENCOUNTER — Inpatient Hospital Stay

## 2024-06-25 ENCOUNTER — Ambulatory Visit (HOSPITAL_COMMUNITY)
Admission: RE | Admit: 2024-06-25 | Discharge: 2024-06-25 | Disposition: A | Discharge status: Home / Self Care | Source: Ambulatory Visit | Attending: Internal Medicine | Admitting: Internal Medicine

## 2024-06-25 DIAGNOSIS — Z923 Personal history of irradiation: Secondary | ICD-10-CM | POA: Diagnosis not present

## 2024-06-25 DIAGNOSIS — C3412 Malignant neoplasm of upper lobe, left bronchus or lung: Secondary | ICD-10-CM | POA: Diagnosis present

## 2024-06-25 DIAGNOSIS — C3492 Malignant neoplasm of unspecified part of left bronchus or lung: Secondary | ICD-10-CM

## 2024-06-25 DIAGNOSIS — C3490 Malignant neoplasm of unspecified part of unspecified bronchus or lung: Secondary | ICD-10-CM | POA: Diagnosis present

## 2024-06-25 DIAGNOSIS — Z9221 Personal history of antineoplastic chemotherapy: Secondary | ICD-10-CM | POA: Diagnosis not present

## 2024-06-25 LAB — CBC WITH DIFFERENTIAL (CANCER CENTER ONLY)
Abs Immature Granulocytes: 0 K/uL (ref 0.00–0.07)
Basophils Absolute: 0 K/uL (ref 0.0–0.1)
Basophils Relative: 1 %
Eosinophils Absolute: 0.1 K/uL (ref 0.0–0.5)
Eosinophils Relative: 2 %
HCT: 38.4 % (ref 36.0–46.0)
Hemoglobin: 12.1 g/dL (ref 12.0–15.0)
Immature Granulocytes: 0 %
Lymphocytes Relative: 26 %
Lymphs Abs: 1.1 K/uL (ref 0.7–4.0)
MCH: 25.9 pg — ABNORMAL LOW (ref 26.0–34.0)
MCHC: 31.5 g/dL (ref 30.0–36.0)
MCV: 82.2 fL (ref 80.0–100.0)
Monocytes Absolute: 0.4 K/uL (ref 0.1–1.0)
Monocytes Relative: 11 %
Neutro Abs: 2.5 K/uL (ref 1.7–7.7)
Neutrophils Relative %: 60 %
Platelet Count: 220 K/uL (ref 150–400)
RBC: 4.67 MIL/uL (ref 3.87–5.11)
RDW: 12 % (ref 11.5–15.5)
WBC Count: 4.2 K/uL (ref 4.0–10.5)
nRBC: 0 % (ref 0.0–0.2)

## 2024-06-25 LAB — CMP (CANCER CENTER ONLY)
ALT: 10 U/L (ref 0–44)
AST: 18 U/L (ref 15–41)
Albumin: 4.3 g/dL (ref 3.5–5.0)
Alkaline Phosphatase: 60 U/L (ref 38–126)
Anion gap: 4 — ABNORMAL LOW (ref 5–15)
BUN: 13 mg/dL (ref 6–20)
CO2: 30 mmol/L (ref 22–32)
Calcium: 9.2 mg/dL (ref 8.9–10.3)
Chloride: 104 mmol/L (ref 98–111)
Creatinine: 0.85 mg/dL (ref 0.44–1.00)
GFR, Estimated: >60 mL/min (ref >60)
Glucose, Bld: 83 mg/dL (ref 70–99)
Potassium: 4.3 mmol/L (ref 3.5–5.1)
Sodium: 138 mmol/L (ref 135–145)
Total Bilirubin: 0.4 mg/dL (ref 0.0–1.2)
Total Protein: 7.1 g/dL (ref 6.5–8.1)

## 2024-06-25 MED ORDER — IOHEXOL 300 MG/ML  SOLN
75.0000 mL | Freq: Once | INTRAMUSCULAR | Status: AC | PRN
  Administered 2024-06-25: 75 mL via INTRAVENOUS

## 2024-06-26 ENCOUNTER — Ambulatory Visit: Payer: BC Managed Care – PPO | Admitting: Internal Medicine

## 2024-06-26 ENCOUNTER — Other Ambulatory Visit (HOSPITAL_COMMUNITY): Payer: Self-pay

## 2024-07-02 ENCOUNTER — Other Ambulatory Visit (HOSPITAL_COMMUNITY): Payer: Self-pay

## 2024-07-03 ENCOUNTER — Other Ambulatory Visit (HOSPITAL_COMMUNITY): Payer: Self-pay

## 2024-07-04 ENCOUNTER — Inpatient Hospital Stay (HOSPITAL_BASED_OUTPATIENT_CLINIC_OR_DEPARTMENT_OTHER): Admitting: Internal Medicine

## 2024-07-04 ENCOUNTER — Other Ambulatory Visit (HOSPITAL_COMMUNITY): Payer: Self-pay

## 2024-07-04 VITALS — BP 103/68 | HR 96 | Temp 97.1°F | Resp 17 | Wt 91.7 lb

## 2024-07-04 DIAGNOSIS — C3490 Malignant neoplasm of unspecified part of unspecified bronchus or lung: Secondary | ICD-10-CM | POA: Diagnosis not present

## 2024-07-04 DIAGNOSIS — C3412 Malignant neoplasm of upper lobe, left bronchus or lung: Secondary | ICD-10-CM | POA: Diagnosis not present

## 2024-07-04 NOTE — Progress Notes (Signed)
 [facility] [phone]   [phone]  OFFICE PROGRESS NOTE  Elaine Slater, MD [address] [place] [address]  DIAGNOSIS: Stage IIB  (T2b, N1, M0) non-small cell lung cancer with unknown subtype diagnosed in January 2024 and presented with large left upper lobe lung mass in addition to left hilar adenopathy.    Detected Alteration(s) / Biomarker(s) Associated FDA-approved therapies Clinical Trial Availability % cfDNA or Amplification [id number] None Yes 0.4%   PRIOR THERAPY: Concurrent chemoradiation with weekly carboplatin  for AUC of 2 and paclitaxel  45 Mg/M2.  First dose December 05, 2022.  Status post 7 cycles.  Last dose was given in January 16, 2023 with partial response.   CURRENT THERAPY: Observation.   INTERVAL HISTORY: Elaine Gibson 48 y.o. female returns to the clinic today for follow-up visit. Discussed the use of AI scribe software for clinical note transcription with the patient, who gave verbal consent to proceed.  History of Present Illness Elaine Gibson is a 48 year old female with stage 2B non-small cell lung cancer who presents for follow-up after completing chemoradiation therapy.  Diagnosed with stage 2B non-small cell lung cancer in January 2024, she completed concurrent chemoradiation with weekly carboplatin  and paclitaxel  in March 2024. Since then, she has been under observation.  She has no new complaints or symptoms different from her last visit six months ago. No chest pain, shortness of breath, or cough beyond her usual baseline. She denies hemoptysis.  She notes a slight weight gain of 0.7 pounds since her last visit. Occasional fatigue is present, which she attributes to overworking herself.  Current medications and dosages were not discussed.   MEDICAL HISTORY: Past Medical History:  Diagnosis Date   Alpha-1-antitrypsin deficiency carrier    Dyspnea    Emphysema lung (HCC)     Emphysema of lung (HCC)    Hypothyroidism    tumors    Scoliosis     ALLERGIES:  is allergic to paclitaxel .  MEDICATIONS:  Current Outpatient Medications  Medication Sig Dispense Refill   albuterol  (PROVENTIL ) (2.5 MG/3ML) 0.083% nebulizer solution Take 3 mLs (2.5 mg total) by nebulization every 4 (four) hours as needed for wheezing or shortness of breath. 75 mL 12   albuterol  (VENTOLIN  HFA) 108 (90 Base) MCG/ACT inhaler Inhale 2 puffs into the lungs every 2 (two) hours as needed for wheezing or shortness of breath. Max of 12 puffs per day 18 g 1   apixaban  (ELIQUIS ) 5 MG TABS tablet Take 1 tablet (5 mg total) by mouth 2 (two) times daily. 60 tablet 5   APIXABAN  (ELIQUIS ) VTE STARTER PACK (10MG  AND 5MG ) Take as directed on package: start with two-5mg  tablets twice daily for 7 days. On day 8, switch to one-5mg  tablet twice daily. 74 each 0   Fluticasone -Umeclidin-Vilant (TRELEGY ELLIPTA ) 200-62.5-25 MCG/ACT AEPB Inhale 1 puff into the lungs daily. 60 each 11   guaiFENesin  (MUCINEX ) 600 MG 12 hr tablet Take 2 tablets (1,200 mg total) by mouth 2 (two) times daily. 120 tablet 0   ipratropium-albuterol  (DUONEB) 0.5-2.5 (3) MG/3ML SOLN Take 3 mLs by nebulization 3 (three) times daily. 360 mL 0   No current facility-administered medications for this visit.    SURGICAL HISTORY:  Past Surgical History:  Procedure Laterality Date   BACK SURGERY     BRONCHIAL BIOPSY  11/15/2022   Procedure: BRONCHIAL BIOPSIES;  Surgeon: Brenna Adine CROME, DO;  Location: [facility];  Service:  Pulmonary;;   BRONCHIAL NEEDLE ASPIRATION BIOPSY  11/15/2022   Procedure: BRONCHIAL NEEDLE ASPIRATION BIOPSIES;  Surgeon: Brenna Adine CROME, DO;  Location: [facility];  Service: Pulmonary;;   CESAREAN SECTION     FINE NEEDLE ASPIRATION  11/15/2022   Procedure: FINE NEEDLE ASPIRATION (FNA) LINEAR;  Surgeon: Brenna Adine CROME, DO;  Location: [facility];  Service: Pulmonary;;   VIDEO BRONCHOSCOPY WITH ENDOBRONCHIAL ULTRASOUND  Bilateral 11/15/2022   Procedure: VIDEO BRONCHOSCOPY WITH ENDOBRONCHIAL ULTRASOUND;  Surgeon: Brenna Adine CROME, DO;  Location: [facility];  Service: Pulmonary;  Laterality: Bilateral;    REVIEW OF SYSTEMS:  A comprehensive review of systems was negative except for: Constitutional: positive for fatigue Respiratory: positive for dyspnea on exertion   PHYSICAL EXAMINATION: General appearance: alert, cooperative, fatigued, and no distress Head: Normocephalic, without obvious abnormality, atraumatic Neck: no adenopathy, no JVD, supple, symmetrical, trachea midline, and thyroid  not enlarged, symmetric, no tenderness/mass/nodules Lymph nodes: Cervical, supraclavicular, and axillary nodes normal. Resp: clear to auscultation bilaterally Back: symmetric, no curvature. ROM normal. No CVA tenderness. Cardio: regular rate and rhythm, S1, S2 normal, no murmur, click, rub or gallop GI: soft, non-tender; bowel sounds normal; no masses,  no organomegaly Extremities: extremities normal, atraumatic, no cyanosis or edema  ECOG PERFORMANCE STATUS: 1 - Symptomatic but completely ambulatory  Blood pressure 103/68, pulse 96, temperature (!) 97.1 F (36.2 C), resp. rate 17, weight 91 lb 11.2 oz (41.6 kg), SpO2 98%.  LABORATORY DATA: Lab Results  Component Value Date   WBC 4.2 06/25/2024   HGB 12.1 06/25/2024   HCT 38.4 06/25/2024   MCV 82.2 06/25/2024   PLT 220 06/25/2024      Chemistry      Component Value Date/Time   NA 138 06/25/2024 0730   K 4.3 06/25/2024 0730   CL 104 06/25/2024 0730   CO2 30 06/25/2024 0730   BUN 13 06/25/2024 0730   CREATININE 0.85 06/25/2024 0730      Component Value Date/Time   CALCIUM 9.2 06/25/2024 0730   ALKPHOS 60 06/25/2024 0730   AST 18 06/25/2024 0730   ALT 10 06/25/2024 0730   BILITOT 0.4 06/25/2024 0730       RADIOGRAPHIC STUDIES: CT Chest W Contrast Result Date: 06/27/2024 CLINICAL DATA:  Non-small cell lung cancer, staging. * Tracking Code: BO *  EXAM: CT CHEST WITH CONTRAST TECHNIQUE: Multidetector CT imaging of the chest was performed during intravenous contrast administration. RADIATION DOSE REDUCTION: This exam was performed according to the departmental dose-optimization program which includes automated exposure control, adjustment of the mA and/or kV according to patient size and/or use of iterative reconstruction technique. CONTRAST:  75mL OMNIPAQUE  IOHEXOL  300 MG/ML  SOLN COMPARISON:  No priors including CT December 13, 2023 FINDINGS: Cardiovascular: Similar small pericardial effusion. Mediastinum/Nodes: Stable solid 2.8 cm left thyroid  lobe nodule. This was worked up on prior fine-needle aspiration of 03/27/2019. This has been evaluated on previous imaging. (ref: J Am Coll Radiol. 2015 Feb;12(2): 143-50). No further workup of the thyroid  nodule is currently indicated. No pathologically enlarged thoracic lymph nodes identified. Lungs/Pleura: Stable mass like consolidation apically in the left upper lobe extending from the hilum to the apex, compatible with post treatment change. Similar nodular thickening in the right upper lobe measuring 7 mm on image 86/7, unchanged. Severe emphysema with large bulla. Upper Abdomen: No acute abnormality. Musculoskeletal: No aggressive lytic or blastic lesion of bone. Harrington rods noted in the thoracic spine, with mild dextroconvex thoracic scoliosis. IMPRESSION: 1. Stable mass like consolidation apically  in the left upper lobe extending from the hilum to the apex, compatible with post treatment change. 2. Similar nodular thickening in the right upper lobe measuring 7 mm, unchanged. 3. Severe emphysema with large bulla. 4. Similar small pericardial effusion. 5. Stable solid 2.8 cm left thyroid  lobe nodule. This was worked up on prior fine-needle aspiration of 03/27/2019. This has been evaluated on previous imaging. No further workup of the thyroid  nodule is currently indicated. Aortic Atherosclerosis (ICD10-I70.0)  and Emphysema (ICD10-J43.9). Electronically Signed   By: Reyes Holder M.D.   On: 06/27/2024 15:55    ASSESSMENT AND PLAN: This is a very pleasant 48 years old African-American female with Stage IIb (T2b, N1, M0) non-small cell lung cancer with unknown subtype diagnosed in January 2024 and presented with large left upper lobe lung mass in addition to left hilar adenopathy.  Molecular studies by [email or web address] showed no actionable mutations The patient underwent a course of concurrent chemoradiation with weekly carboplatin  for AUC of 2 and paclitaxel  45 Mg/M2.  She is status post 7 cycles.  She tolerated this treatment well except for fatigue. She is currently on observation and feeling fine except for the fatigue from working 2 jobs. She had repeat CT scan of the chest performed recently.  I personally and independently reviewed the scan and discussed the result with the patient today.  Her scan showed no concerning findings for disease recurrence or metastasis. Assessment and Plan Assessment & Plan Stage 2B non-small cell lung cancer, post-chemoradiation, under surveillance Stage 2B non-small cell lung cancer, status post completion of concurrent chemoradiation with weekly carboplatin  and paclitaxel  in March 2024. Currently under surveillance with no new symptoms such as chest pain, dyspnea, cough, or hemoptysis. No significant weight loss. Recent imaging shows no evidence of disease progression or metastasis, with findings stable compared to previous scans. - Continue surveillance with imaging in six months - Consider extending imaging interval to once a year if next scan is stable  She was advised to call immediately if she has any concerning symptoms in the interval.  The patient voices understanding of current disease status and treatment options and is in agreement with the current care plan.  All questions were answered. The patient knows to call the clinic with any problems, questions or  concerns. We can certainly see the patient much sooner if necessary.  The total time spent in the appointment was 20 minutes.  Disclaimer: This note was dictated with voice recognition software. Similar sounding words can inadvertently be transcribed and may not be corrected upon review.

## 2024-07-05 ENCOUNTER — Other Ambulatory Visit (HOSPITAL_COMMUNITY): Payer: Self-pay

## 2024-07-09 ENCOUNTER — Other Ambulatory Visit (HOSPITAL_COMMUNITY): Payer: Self-pay

## 2024-07-10 ENCOUNTER — Other Ambulatory Visit (HOSPITAL_COMMUNITY): Payer: Self-pay

## 2024-07-12 ENCOUNTER — Other Ambulatory Visit (HOSPITAL_COMMUNITY): Payer: Self-pay

## 2024-07-16 ENCOUNTER — Other Ambulatory Visit (HOSPITAL_COMMUNITY): Payer: Self-pay

## 2024-07-18 ENCOUNTER — Other Ambulatory Visit (HOSPITAL_COMMUNITY): Payer: Self-pay

## 2024-07-19 ENCOUNTER — Other Ambulatory Visit (HOSPITAL_COMMUNITY): Payer: Self-pay

## 2024-07-25 ENCOUNTER — Encounter (HOSPITAL_COMMUNITY): Payer: Self-pay

## 2024-07-25 ENCOUNTER — Other Ambulatory Visit (HOSPITAL_COMMUNITY): Payer: Self-pay

## 2024-07-26 ENCOUNTER — Other Ambulatory Visit (HOSPITAL_COMMUNITY): Payer: Self-pay

## 2024-08-01 ENCOUNTER — Other Ambulatory Visit (HOSPITAL_COMMUNITY): Payer: Self-pay

## 2024-08-08 ENCOUNTER — Other Ambulatory Visit (HOSPITAL_COMMUNITY): Payer: Self-pay

## 2024-08-13 ENCOUNTER — Other Ambulatory Visit (HOSPITAL_COMMUNITY): Payer: Self-pay

## 2024-09-02 ENCOUNTER — Other Ambulatory Visit (HOSPITAL_COMMUNITY): Payer: Self-pay

## 2024-09-03 ENCOUNTER — Other Ambulatory Visit (HOSPITAL_COMMUNITY): Payer: Self-pay

## 2024-10-08 ENCOUNTER — Emergency Department (HOSPITAL_COMMUNITY)

## 2024-10-08 ENCOUNTER — Encounter (HOSPITAL_COMMUNITY): Payer: Self-pay | Admitting: Emergency Medicine

## 2024-10-08 ENCOUNTER — Emergency Department (HOSPITAL_COMMUNITY)
Admission: EM | Admit: 2024-10-08 | Discharge: 2024-10-08 | Disposition: A | Discharge status: Home / Self Care | Attending: Emergency Medicine | Admitting: Emergency Medicine

## 2024-10-08 DIAGNOSIS — R519 Headache, unspecified: Secondary | ICD-10-CM

## 2024-10-08 LAB — COMPREHENSIVE METABOLIC PANEL WITH GFR
ALT: 12 U/L (ref 0–44)
AST: 23 U/L (ref 15–41)
Albumin: 4 g/dL (ref 3.5–5.0)
Alkaline Phosphatase: 69 U/L (ref 38–126)
Anion gap: 8 (ref 5–15)
BUN: 7 mg/dL (ref 6–20)
CO2: 28 mmol/L (ref 22–32)
Calcium: 9.4 mg/dL (ref 8.9–10.3)
Chloride: 101 mmol/L (ref 98–111)
Creatinine, Ser: 0.62 mg/dL (ref 0.44–1.00)
GFR, Estimated: >60 mL/min (ref >60)
Glucose, Bld: 78 mg/dL (ref 70–99)
Potassium: 4.4 mmol/L (ref 3.5–5.1)
Sodium: 137 mmol/L (ref 135–145)
Total Bilirubin: 0.4 mg/dL (ref 0.0–1.2)
Total Protein: 6.7 g/dL (ref 6.5–8.1)

## 2024-10-08 LAB — CBC WITH DIFFERENTIAL/PLATELET
Abs Immature Granulocytes: 0.01 K/uL (ref 0.00–0.07)
Basophils Absolute: 0.1 K/uL (ref 0.0–0.1)
Basophils Relative: 1 %
Eosinophils Absolute: 0.1 K/uL (ref 0.0–0.5)
Eosinophils Relative: 2 %
HCT: 41.5 % (ref 36.0–46.0)
Hemoglobin: 12.5 g/dL (ref 12.0–15.0)
Immature Granulocytes: 0 %
Lymphocytes Relative: 22 %
Lymphs Abs: 1.2 K/uL (ref 0.7–4.0)
MCH: 26 pg (ref 26.0–34.0)
MCHC: 30.1 g/dL (ref 30.0–36.0)
MCV: 86.5 fL (ref 80.0–100.0)
Monocytes Absolute: 0.5 K/uL (ref 0.1–1.0)
Monocytes Relative: 9 %
Neutro Abs: 3.6 K/uL (ref 1.7–7.7)
Neutrophils Relative %: 66 %
Platelets: 233 K/uL (ref 150–400)
RBC: 4.8 MIL/uL (ref 3.87–5.11)
RDW: 12.9 % (ref 11.5–15.5)
WBC: 5.4 K/uL (ref 4.0–10.5)
nRBC: 0 % (ref 0.0–0.2)

## 2024-10-08 LAB — HCG, SERUM, QUALITATIVE: Preg, Serum: NEGATIVE

## 2024-10-08 MED ORDER — ACETAMINOPHEN 500 MG PO TABS
1000.0000 mg | ORAL_TABLET | Freq: Once | ORAL | Status: AC
  Administered 2024-10-08: 1000 mg via ORAL
  Filled 2024-10-08: qty 2

## 2024-10-08 MED ORDER — LACTATED RINGERS IV BOLUS
1000.0000 mL | Freq: Once | INTRAVENOUS | Status: AC
  Administered 2024-10-08: 1000 mL via INTRAVENOUS

## 2024-10-08 MED ORDER — DIPHENHYDRAMINE HCL 50 MG/ML IJ SOLN
25.0000 mg | Freq: Once | INTRAMUSCULAR | Status: AC
  Administered 2024-10-08: 25 mg via INTRAVENOUS
  Filled 2024-10-08: qty 1

## 2024-10-08 MED ORDER — DEXAMETHASONE SOD PHOSPHATE PF 10 MG/ML IJ SOLN
10.0000 mg | Freq: Once | INTRAMUSCULAR | Status: AC
  Administered 2024-10-08: 10 mg via INTRAVENOUS

## 2024-10-08 MED ORDER — PROCHLORPERAZINE EDISYLATE 10 MG/2ML IJ SOLN
10.0000 mg | Freq: Once | INTRAMUSCULAR | Status: AC
  Administered 2024-10-08: 10 mg via INTRAVENOUS
  Filled 2024-10-08: qty 2

## 2024-10-08 NOTE — Discharge Instructions (Signed)
 Elaine Gibson  Thank you for allowing us  to take care of you today.  You came to the Emergency Department today because you had a bad headache, and your headaches have been increasing in frequency and severity.  Here in the emergency department we gave you a headache cocktail and your headache has improved, your CT did not show any emergency causes of headache, and your labs are reassuring.  It is hard to say if your headache is a migraine, stress headache, or potentially rebound headache.  A rebound headache is when you chronically take anti-inflammatory such as Excedrin, Tylenol , ibuprofen , and your body gets used to a lower state of inflammation, therefore when the medications wear off your headache can come back.  It can be hard to get these types of headaches under control, the best way is to slowly wean yourself off of the anti-inflammatory medications.  You can try this at home over the next several days to weeks.  Additionally we are going to refer you to neurology for further follow-up of your headaches.  To-Do: 1. Please follow-up with your primary doctor within 1 - 2 weeks / as soon as possible.   Please return to the Emergency Department or call 911 if you experience have worsening of your symptoms, or do not get better, chest pain, shortness of breath, severe or significantly worsening pain, high fever, severe confusion, pass out or have any reason to think that you need emergency medical care.   We hope you feel better soon.   Mitzie Later, MD Department of Emergency Medicine [facility] [place]

## 2024-10-08 NOTE — ED Provider Notes (Signed)
 [facility] AT [facility] Provider Note   CSN: [id number] Arrival date & time: 10/08/24  9094     History Chief Complaint  Patient presents with   Migraine    HPI: Elaine Gibson is a 48 y.o. female with history pertinent for COPD not on home oxygen, lung cancer in remission, who presents complaining of headache. Patient arrived via POV.  History provided by patient.  No interpreter required during this encounter.  She reports that she has a throbbing global headache with accompanied by photophobia and phonophobia.  Reports that this has been ongoing for approximately 3 days, however she has had these headaches intermittently for approximately 2 months.  Over this timeframe they have been increasing in frequency, intensity, duration.  Previously they were easily responsive to Tylenol , ibuprofen , Excedrin, and now she only receives partial improvement from Excedrin, however states that the headache quickly returns.  Denies fever, chest pain, shortness of breath, nausea, vomiting, diarrhea.  Reports that she has not previously been formally diagnosed with migraines, does not follow with neurology for this condition.  Patient's recorded medical, surgical, social, medication list and allergies were reviewed in the Snapshot window as part of the initial history.   Prior to Admission medications   Medication Sig Start Date End Date Taking? Authorizing Provider  albuterol  (PROVENTIL ) (2.5 MG/3ML) 0.083% nebulizer solution Take 3 mLs (2.5 mg total) by nebulization every 4 (four) hours as needed for wheezing or shortness of breath. 03/12/24   Barbarann Nest, MD  albuterol  (VENTOLIN  HFA) 108 [phone] Base) MCG/ACT inhaler Inhale 2 puffs into the lungs every 2 (two) hours as needed for wheezing or shortness of breath. Max of 12 puffs per day 03/12/24   Barbarann Nest, MD  apixaban  (ELIQUIS ) 5 MG TABS tablet Take 1 tablet (5 mg total) by mouth 2 (two) times daily. 04/12/24    Kara Dorn NOVAK, MD  APIXABAN  (ELIQUIS ) VTE STARTER PACK (10MG  AND 5MG ) Take as directed on package: start with two-5mg  tablets twice daily for 7 days. On day 8, switch to one-5mg  tablet twice daily. 03/12/24   Barbarann Nest, MD  Fluticasone -Umeclidin-Vilant (TRELEGY ELLIPTA ) 200-62.5-25 MCG/ACT AEPB Inhale 1 puff into the lungs daily. 04/12/24   Kara Dorn NOVAK, MD  guaiFENesin  (MUCINEX ) 600 MG 12 hr tablet Take 2 tablets (1,200 mg total) by mouth 2 (two) times daily. 03/12/24   Barbarann Nest, MD  ipratropium-albuterol  (DUONEB) 0.5-2.5 (3) MG/3ML SOLN Take 3 mLs by nebulization 3 (three) times daily. 03/12/24   Barbarann Nest, MD     Allergies: Paclitaxel    Review of Systems   ROS as per HPI  Physical Exam Updated Vital Signs BP 107/65 (BP Location: Right Arm)   Pulse 85   Temp 98.4 F (36.9 C) (Oral)   Resp 16   LMP 10/06/2024   SpO2 94%  Physical Exam Vitals and nursing note reviewed.  Constitutional:      General: She is not in acute distress.    Appearance: She is well-developed.  HENT:     Head: Normocephalic and atraumatic.  Eyes:     Conjunctiva/sclera: Conjunctivae normal.  Cardiovascular:     Rate and Rhythm: Normal rate and regular rhythm.     Heart sounds: No murmur heard. Pulmonary:     Effort: Pulmonary effort is normal. No respiratory distress.     Breath sounds: Normal breath sounds.  Abdominal:     Palpations: Abdomen is soft.     Tenderness: There is no abdominal tenderness.  Musculoskeletal:        General: No swelling.     Cervical back: Neck supple.  Skin:    General: Skin is warm and dry.     Capillary Refill: Capillary refill takes less than 2 seconds.  Neurological:     Mental Status: She is alert.     GCS: GCS eye subscore is 4. GCS verbal subscore is 5. GCS motor subscore is 6.     Cranial Nerves: No cranial nerve deficit, dysarthria or facial asymmetry.     Sensory: Sensation is intact.     Motor: No weakness, tremor or abnormal muscle  tone.     Coordination: Coordination normal. Finger-Nose-Finger Test normal.  Psychiatric:        Mood and Affect: Mood normal.     ED Course/ Medical Decision Making/ A&P    Procedures Procedures   Medications Ordered in ED Medications  lactated ringers  bolus 1,000 mL (0 mLs Intravenous Stopped 10/08/24 1436)  acetaminophen  (TYLENOL ) tablet 1,000 mg (1,000 mg Oral Given 10/08/24 1126)  prochlorperazine  (COMPAZINE ) injection 10 mg (10 mg Intravenous Given 10/08/24 1127)  diphenhydrAMINE  (BENADRYL ) injection 25 mg (25 mg Intravenous Given 10/08/24 1127)  dexamethasone  (DECADRON ) injection 10 mg (10 mg Intravenous Given 10/08/24 1127)    Medical Decision Making:   [name] is a 48 y.o. female who presents for headache as per above.  Physical exam is pertinent for no focal abnormalities.   The differential includes but is not limited to tension headache, rebound headache, migraine, ICH, mass lesion, infection.  Independent historian: None  External data reviewed: No pertinent external data  Initial Plan:  Screening labs including CBC and Metabolic panel to evaluate for infectious or metabolic etiology of disease.  Screening hCG given age, need for CT head CT head to evaluate for structural/vascular intra-cranial pathology.  Objective evaluation as below reviewed   Labs: Ordered, Independent interpretation, and Details: CBC without leukocytosis, anemia, thrombocytopenia.  CMP without AKI, emergent oxide treatment, emergent LFT abnormality.  Serum hCG negative.  Radiology: Ordered, Independent interpretation, Details: CT head without ICH, displaced fracture, MLS, loss of gray/white matter differentiation,, and All images reviewed independently.  Agree with radiology report at this time.   CT Head Wo Contrast Result Date: 10/08/2024 EXAM: CT HEAD WITHOUT CONTRAST 10/08/2024 10:33:26 AM TECHNIQUE: CT of the head was performed without the administration of intravenous  contrast. Automated exposure control, iterative reconstruction, and/or weight based adjustment of the mA/kV was utilized to reduce the radiation dose to as low as reasonably achievable. COMPARISON: Head CT 10/31/2022. CLINICAL HISTORY: 48 year old female Headache, increasing frequency or severity. FINDINGS: BRAIN AND VENTRICLES: No acute hemorrhage. No evidence of acute infarct. No hydrocephalus. No extra-axial collection. No mass effect or midline shift. Brain volume remains normal. Incidental arachnoid granulation at the junction of the left transverse and sigmoid venous sinuses again noted (physiologic series 2, image 11). Gray white differentiation appears stable and normal. Very faint distal ICA calcified atherosclerosis. No suspicious intracranial vascular hyperdensity. ORBITS: No acute abnormality. SINUSES: Visible paranasal sinuses, middle ears and mastoids are well aerated. SOFT TISSUES AND SKULL: No acute soft tissue abnormality. No skull fracture. Chronic benign left lateral skull exostosis (series 4, image 26) is small, stable, and appears inconsequential. IMPRESSION: 1. No acute intracranial abnormality. Normal for age non contrast CT appearance of the brain. Electronically signed by: Helayne Hurst MD 10/08/2024 10:38 AM EST RP Workstation: [id number]    EKG/Medicine tests: Not indicated EKG Interpretation:  Interventions: Compazine , LR bolus, Benadryl , Decadron , Tylenol   See the EMR for full details regarding lab and imaging results.  Patient presents for global headache with photophobia and phonophobia.  Patient does not have focal neurologic deficits on exam.  Given global distribution, less common for migraine.  Patient does report that she takes near daily OTC medications for pain, therefore very possible that patient is experiencing rebound headache, however given patient's prior history of malignancy (in remission), and increasing frequency and severity of headache, do  feel that CT of the head is reasonable, as well as screening labs.  These reveal no abnormality.  CT head also reassuring.  After agreeing cocktail, patient was able to sleep, notably did have some mild desaturations while sleeping, however after awakening, patient with normal oxygenation, denies any shortness of breath, chest pain.  Patient reports resolution of headache.  Discussed likely etiology of rebound headache, discussed trying to titrate herself off of OTC headache medications at home, and need for follow-up with neurology.  Return precautions discussed, patient expressed understanding, discharged in stable condition.  Presentation is most consistent with acute complicated illness and I did consider and rule out acute life/limb-threatening illness  Discussion of management or test interpretations with external provider(s): Not indicated  Risk Drugs:OTC drugs and Prescription drug management  Disposition: DISCHARGE: I believe that the patient is safe for discharge home with outpatient follow-up. Patient was informed of all pertinent physical exam, laboratory, and imaging findings. Patient's suspected etiology of their symptom presentation was discussed with the patient and all questions were answered. We discussed following up with PCP, neurology. I provided thorough ED return precautions. The patient feels safe and comfortable with this plan.  MDM generated using voice dictation software and may contain dictation errors.  Please contact me for any clarification or with any questions.  Clinical Impression:  1. Bad headache      Discharge   Final Clinical Impression(s) / ED Diagnoses Final diagnoses:  Bad headache    Rx / DC Orders ED Discharge Orders          Ordered    Ambulatory referral to Neurology       Comments: An appointment is requested in approximately: 1 week   10/08/24 1459             Rogelia Jerilynn RAMAN, MD 10/14/24 2336

## 2024-10-08 NOTE — ED Triage Notes (Addendum)
 Pt arriving POV with intermittent migraines x2 months. Pt also presents with non-productive cough related to lung cancer. Last had chemo in April.

## 2024-10-08 NOTE — ED Notes (Signed)
 Medic notified of pt's O2 reading

## 2024-10-14 ENCOUNTER — Other Ambulatory Visit: Payer: Self-pay

## 2024-10-14 DIAGNOSIS — Z1231 Encounter for screening mammogram for malignant neoplasm of breast: Secondary | ICD-10-CM

## 2024-10-17 ENCOUNTER — Inpatient Hospital Stay: Admission: RE | Admit: 2024-10-17

## 2024-10-24 ENCOUNTER — Telehealth: Payer: Self-pay | Admitting: Pulmonary Disease

## 2024-10-24 NOTE — Telephone Encounter (Signed)
 Copied from CRM #[id number]. Topic: General - Other >> Oct 24, 2024 12:08 PM Corean SAUNDERS wrote: Reason for CRM: Patient is requesting a call back from Dr. Alejandra nurse as he recently filled out FMLA paperwork for her that stated her situation is indefinite but patient states her work is advising this is not sufficient as she exhausted her FMLA hours and is advising her to go on short term disability. Patient states clinic will be receiving a call from the [facility] regarding this matter.

## 2024-10-24 NOTE — Telephone Encounter (Signed)
 Routing to Dr. Luann CMA, [place].

## 2024-10-25 NOTE — Telephone Encounter (Signed)
 Spoke with patient Elaine Gibson, will reach out to company so we can start the process for her.

## 2024-10-28 ENCOUNTER — Telehealth: Payer: Self-pay

## 2024-10-28 NOTE — Telephone Encounter (Signed)
 Spoke with some one from [employer] they were sending to a wrong fax number updated fax to receive directly  to me so we can start process for patient     Tried to reach out to patient VM/ LM return call

## 2024-10-28 NOTE — Telephone Encounter (Signed)
 Spoke with patient Elaine Gibson.   Spoke to [employer] waiting on paperwork to be fax      Copied from CRM [id number]. Topic: General - Other >> Oct 28, 2024  8:37 AM Elaine Gibson wrote: Reason for CRM: Patient is calling to state that her FMLA claims company has been trying to reach out office but we have not responded. Patient is requesting we respond to their requests when received. >> Oct 28, 2024 12:37 PM Elaine Gibson wrote: Patient [phone] is returning the office/Dr. [facility] nurse call. Informed patient, 10/24/24 telephone message. Patient states [employer] put her on to return back to work 11/12/24 or the 11/17/24, and was advised to let them know if patient needs more time to let them know, patient states will need more time. Please call back.

## 2024-10-28 NOTE — Telephone Encounter (Signed)
 Copied from CRM #[id number]. Topic: General - Other >> Oct 28, 2024  8:37 AM [name] wrote: Reason for CRM: Patient is calling to state that her FMLA claims company has been trying to reach out office but we have not responded. Patient is requesting we respond to their requests when received.   Routing to [place] to advise on this.

## 2024-10-30 ENCOUNTER — Encounter: Payer: Self-pay | Admitting: Neurology

## 2024-11-04 ENCOUNTER — Telehealth: Payer: Self-pay

## 2024-11-04 NOTE — Telephone Encounter (Signed)
 Tried to reach out to patient,  Received short term disability paperwork provider not in clinic will send once filled out and signed

## 2024-11-08 ENCOUNTER — Telehealth: Payer: Self-pay

## 2024-11-08 NOTE — Telephone Encounter (Signed)
 See phone note from 11/08/24     Copied from CRM #[id number]. Topic: General - Other >> Nov 08, 2024  8:55 AM [name] wrote: Reason for CRM: Patient is requesting that, because Dr. Kara is out of the office currently and disability won't be completed in time for her return to work date, we reach out to the disability folks to let them know that our provider is out and we cannot complete paperwork, therefore delay of patient return is due to us . Patient is requesting we call the disability office and then update her accordingly.

## 2024-11-08 NOTE — Telephone Encounter (Signed)
 Tried to reach out to patient in regards to her short term disabilty paperwork    Spoke with Dr. Kara  he would be more than happy to fill out the paperwork, but it may be better filled out by the Cancer center team due to her Dx.    Since I do have the paperwork I can fax it to them so I know they get it and provider here can still fill out the paperwork as well.

## 2024-11-08 NOTE — Telephone Encounter (Signed)
 nfn

## 2024-11-08 NOTE — Telephone Encounter (Signed)
 Spoke with patient VBU, waiting on provider to fill oput paperwork     Copied from CRM [id number]. Topic: General - Other >> Nov 08, 2024 12:15 PM Devaughn RAMAN wrote: Reason for CRM: pt returning [facility] phone call, pt stated the office advised her they needed it to be filled out by the last provider who seen her and did her FMLA paperwork which was Dr.Dewald. Pt stated In order to start her case they needed the information from Dr.Dewald, they also needed a date for the paperwork for her return to work date either January 2nd or 6th.

## 2024-11-18 ENCOUNTER — Ambulatory Visit: Admitting: Pulmonary Disease

## 2024-11-18 ENCOUNTER — Encounter: Payer: Self-pay | Admitting: Pulmonary Disease

## 2024-11-18 ENCOUNTER — Other Ambulatory Visit (HOSPITAL_COMMUNITY): Payer: Self-pay

## 2024-11-18 VITALS — BP 96/64 | HR 100 | Ht 65.0 in | Wt 90.0 lb

## 2024-11-18 DIAGNOSIS — R0902 Hypoxemia: Secondary | ICD-10-CM | POA: Diagnosis not present

## 2024-11-18 DIAGNOSIS — G4734 Idiopathic sleep related nonobstructive alveolar hypoventilation: Secondary | ICD-10-CM

## 2024-11-18 DIAGNOSIS — J441 Chronic obstructive pulmonary disease with (acute) exacerbation: Secondary | ICD-10-CM

## 2024-11-18 DIAGNOSIS — C3412 Malignant neoplasm of upper lobe, left bronchus or lung: Secondary | ICD-10-CM | POA: Diagnosis not present

## 2024-11-18 DIAGNOSIS — C3492 Malignant neoplasm of unspecified part of left bronchus or lung: Secondary | ICD-10-CM

## 2024-11-18 MED ORDER — AZITHROMYCIN 250 MG PO TABS
ORAL_TABLET | ORAL | 0 refills | Status: AC
  Filled 2024-11-18: qty 6, 5d supply, fill #0

## 2024-11-18 MED ORDER — PREDNISONE 10 MG PO TABS
ORAL_TABLET | ORAL | 0 refills | Status: AC
  Filled 2024-11-18: qty 30, 12d supply, fill #0

## 2024-11-18 NOTE — Patient Instructions (Addendum)
 Continue Trellegy ellipta 200mcg, 1 puff daily - rinse mouth out after each use  Use duoneb nebulizer treatment twice daily followed by flutter valve for mucous clearance  Continue supplemental oxygen 2L at bedtime  Start prednisone  taper 40mg  daily x 3 days 30mg  daily x 3 days 20mg  daily x 3 days 10mg  daily x 3 days  Start Zpak antibiotic for 5 days  Follow up in 6 months

## 2024-11-18 NOTE — Assessment & Plan Note (Addendum)
 SABRA

## 2024-11-18 NOTE — Progress Notes (Signed)
 "  Established Patient Pulmonology Office Visit   Subjective:  Patient ID: Elaine Gibson, female    DOB: 06/05/1976  MRN: [id number]  CC:  Chief Complaint  Patient presents with   Medical Management of Chronic Issues    Pt states short term disability paperwork     Discussed the use of AI scribe software for clinical note transcription with the patient, who gave verbal consent to proceed.  History of Present Illness Elaine Gibson is a 49 year old female with emphysema, stage IIB non-small cell lung cancer, COPD and pulmonary embolism who presents with increased shortness of breath.  She reports worsening shortness of breath over the past few nights, with episodes triggered by strong smells that suddenly take my breath, temperature changes between rooms, and walking to her car. She notes nocturnal chest tightness and poor sleep despite using oxygen at night. She becomes winded with routine activity.  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 She overheats at night, which worsens her breathing, and uses fans for relief. She is off chemotherapy and is being monitored with upcoming scans and labs.  She has frequent wheezing and trouble clearing thick mucus that makes her feel like she might vomit. Trelegy once daily and home nebulizer treatments give only temporary relief. She has not used prednisone  or antibiotics recently. She uses a flutter valve to help cough and clear mucus.  She is a former smoker with emphysema and stage two B non-small cell lung cancer with a left upper lobe mass, status post chemotherapy and radiation completed March 2024. She uses oxygen with activities such as driving and becomes winded with climbing stairs, bending, and lifting more than ten pounds.        ROS   Current Medications[1]      Objective:  BP 96/64   Pulse 100   Ht 5' 5 (1.651 m) Comment: per pt  Wt 90 lb (40.8 kg)    SpO2 91%   BMI 14.98 kg/m     Physical Exam Constitutional:      General: She is not in acute distress.    Appearance: Normal appearance.  Eyes:     General: No scleral icterus.    Conjunctiva/sclera: Conjunctivae normal.  Cardiovascular:     Rate and Rhythm: Normal rate and regular rhythm.  Pulmonary:     Breath sounds: Decreased air movement present. No wheezing, rhonchi or rales.  Musculoskeletal:     Right lower leg: No edema.     Left lower leg: No edema.  Skin:    General: Skin is warm and dry.  Neurological:     General: No focal deficit present.      Diagnostic Review:  Last CBC Lab Results  Component Value Date   WBC 5.4 10/08/2024   HGB 12.5 10/08/2024   HCT 41.5 10/08/2024   MCV 86.5 10/08/2024   MCH 26.0 10/08/2024   RDW 12.9 10/08/2024   PLT 233 10/08/2024   Last metabolic panel Lab Results  Component Value Date   GLUCOSE 78 10/08/2024   NA 137 10/08/2024   K 4.4 10/08/2024   CL 101 10/08/2024   CO2 28 10/08/2024   BUN 7 10/08/2024   CREATININE 0.62 10/08/2024   GFRNONAA >60 10/08/2024   CALCIUM 9.4 10/08/2024   PHOS 3.7 03/12/2024   PROT 6.7 10/08/2024   ALBUMIN 4.0 10/08/2024   BILITOT 0.4 10/08/2024   ALKPHOS 69 10/08/2024   AST  23 10/08/2024   ALT 12 10/08/2024   ANIONGAP 8 10/08/2024       Assessment & Plan:   Assessment & Plan COPD with acute exacerbation (HCC)  Orders:   predniSONE  (DELTASONE ) 10 MG tablet; Take 4 tablets (40 mg total) by mouth daily with breakfast for 3 days, THEN 3 tablets (30 mg total) daily with breakfast for 3 days, THEN 2 tablets (20 mg total) daily with breakfast for 3 days, THEN 1 tablet (10 mg total) daily with breakfast for 3 days.   azithromycin  (ZITHROMAX ) 250 MG tablet; Take as directed  Nocturnal hypoxemia     Non-small cell lung cancer, left (HCC)      Assessment and Plan Assessment & Plan COPD with acute exacerbation Acute exacerbation with increased dyspnea, wheezing, and mucus  production. Symptoms exacerbated by work and environmental factors. Current treatment provides partial relief. - Prescribed prednisone  taper and Zpak for exacerbation - Instructed nebulizer use twice daily with flutter valve for mucus clearance. - Continue Trelegy inhaler as prescribed. - Advised rest and avoidance of work until symptoms improve, with tentative return on January 19th, 2026. - Completed short term disability paperwork today  Nocturnal hypoxemia requiring supplemental oxygen Nocturnal hypoxemia managed with supplemental oxygen. Saturation drops with exertion and environmental changes. - Continue supplemental oxygen therapy at 3 L/min during sleep and as needed.  Stage IIB non-small cell lung cancer, left upper lobe Stage IIB non-small cell lung cancer, currently off chemotherapy and under surveillance. - Continue follow-up with oncologist in February for scans and labs.      Return in about 6 months (around 05/18/2025) for f/u visit Dr. Kara.   Dorn KATHEE Kara, MD     [1]  Current Outpatient Medications:    albuterol  (PROVENTIL ) (2.5 MG/3ML) 0.083% nebulizer solution, Take 3 mLs (2.5 mg total) by nebulization every 4 (four) hours as needed for wheezing or shortness of breath., Disp: 75 mL, Rfl: 12   albuterol  (VENTOLIN  HFA) 108 (90 Base) MCG/ACT inhaler, Inhale 2 puffs into the lungs every 2 (two) hours as needed for wheezing or shortness of breath. Max of 12 puffs per day, Disp: 18 g, Rfl: 1   apixaban  (ELIQUIS ) 5 MG TABS tablet, Take 1 tablet (5 mg total) by mouth 2 (two) times daily., Disp: 60 tablet, Rfl: 5   azithromycin  (ZITHROMAX ) 250 MG tablet, Take as directed, Disp: 6 tablet, Rfl: 0   Fluticasone -Umeclidin-Vilant (TRELEGY ELLIPTA ) 200-62.5-25 MCG/ACT AEPB, Inhale 1 puff into the lungs daily., Disp: 60 each, Rfl: 11   guaiFENesin  (MUCINEX ) 600 MG 12 hr tablet, Take 2 tablets (1,200 mg total) by mouth 2 (two) times daily., Disp: 120 tablet, Rfl: 0    ipratropium-albuterol  (DUONEB) 0.5-2.5 (3) MG/3ML SOLN, Take 3 mLs by nebulization 3 (three) times daily., Disp: 360 mL, Rfl: 0   predniSONE  (DELTASONE ) 10 MG tablet, Take 4 tablets (40 mg total) by mouth daily with breakfast for 3 days, THEN 3 tablets (30 mg total) daily with breakfast for 3 days, THEN 2 tablets (20 mg total) daily with breakfast for 3 days, THEN 1 tablet (10 mg total) daily with breakfast for 3 days., Disp: 30 tablet, Rfl: 0   APIXABAN  (ELIQUIS ) VTE STARTER PACK (10MG  AND 5MG ), Take as directed on package: start with two-5mg  tablets twice daily for 7 days. On day 8, switch to one-5mg  tablet twice daily., Disp: 74 each, Rfl: 0  "

## 2024-11-19 ENCOUNTER — Other Ambulatory Visit: Payer: Self-pay

## 2024-12-25 ENCOUNTER — Other Ambulatory Visit

## 2025-01-01 ENCOUNTER — Ambulatory Visit: Admitting: Internal Medicine

## 2025-02-18 ENCOUNTER — Ambulatory Visit: Payer: Self-pay | Admitting: Neurology
